# Patient Record
Sex: Male | Born: 1955 | Race: White | Hispanic: No | Smoking: Former smoker
Health system: Southern US, Community
[De-identification: ages and names within clinical notes are randomized; demographics above are authoritative.]

## PROBLEM LIST (undated history)

## (undated) DIAGNOSIS — D4989 Neoplasm of unspecified behavior of other specified sites: Secondary | ICD-10-CM

## (undated) DIAGNOSIS — F419 Anxiety disorder, unspecified: Secondary | ICD-10-CM

## (undated) DIAGNOSIS — K760 Fatty (change of) liver, not elsewhere classified: Secondary | ICD-10-CM

## (undated) DIAGNOSIS — G4730 Sleep apnea, unspecified: Secondary | ICD-10-CM

## (undated) DIAGNOSIS — F329 Major depressive disorder, single episode, unspecified: Secondary | ICD-10-CM

## (undated) DIAGNOSIS — M1990 Unspecified osteoarthritis, unspecified site: Secondary | ICD-10-CM

## (undated) DIAGNOSIS — E785 Hyperlipidemia, unspecified: Secondary | ICD-10-CM

## (undated) DIAGNOSIS — T7840XA Allergy, unspecified, initial encounter: Secondary | ICD-10-CM

## (undated) DIAGNOSIS — F32A Depression, unspecified: Secondary | ICD-10-CM

## (undated) DIAGNOSIS — C7951 Secondary malignant neoplasm of bone: Secondary | ICD-10-CM

## (undated) DIAGNOSIS — C61 Malignant neoplasm of prostate: Secondary | ICD-10-CM

## (undated) DIAGNOSIS — B009 Herpesviral infection, unspecified: Secondary | ICD-10-CM

## (undated) DIAGNOSIS — J189 Pneumonia, unspecified organism: Secondary | ICD-10-CM

## (undated) HISTORY — DX: Major depressive disorder, single episode, unspecified: F32.9

## (undated) HISTORY — DX: Neoplasm of unspecified behavior of other specified sites: D49.89

## (undated) HISTORY — DX: Herpesviral infection, unspecified: B00.9

## (undated) HISTORY — DX: Depression, unspecified: F32.A

## (undated) HISTORY — DX: Anxiety disorder, unspecified: F41.9

## (undated) HISTORY — DX: Fatty (change of) liver, not elsewhere classified: K76.0

## (undated) HISTORY — DX: Malignant neoplasm of prostate: C61

## (undated) HISTORY — DX: Allergy, unspecified, initial encounter: T78.40XA

## (undated) HISTORY — PX: INGUINAL HERNIA REPAIR: SUR1180

## (undated) HISTORY — DX: Unspecified osteoarthritis, unspecified site: M19.90

## (undated) HISTORY — DX: Pneumonia, unspecified organism: J18.9

## (undated) HISTORY — PX: KNEE ARTHROPLASTY: SHX992

## (undated) HISTORY — DX: Sleep apnea, unspecified: G47.30

## (undated) HISTORY — DX: Hyperlipidemia, unspecified: E78.5

## (undated) HISTORY — PX: SHOULDER ARTHROTOMY: SHX1050

---

## 1998-03-13 ENCOUNTER — Emergency Department (HOSPITAL_COMMUNITY): Admission: EM | Admit: 1998-03-13 | Discharge: 1998-03-13 | Payer: Self-pay | Admitting: Emergency Medicine

## 1998-03-16 ENCOUNTER — Emergency Department (HOSPITAL_COMMUNITY): Admission: EM | Admit: 1998-03-16 | Discharge: 1998-03-16 | Payer: Self-pay | Admitting: Emergency Medicine

## 2002-05-18 ENCOUNTER — Encounter: Payer: Self-pay | Admitting: Emergency Medicine

## 2005-04-03 ENCOUNTER — Ambulatory Visit: Payer: Self-pay | Admitting: Family Medicine

## 2005-04-10 ENCOUNTER — Ambulatory Visit: Payer: Self-pay | Admitting: Family Medicine

## 2005-04-17 ENCOUNTER — Ambulatory Visit: Payer: Self-pay | Admitting: Internal Medicine

## 2005-04-20 ENCOUNTER — Ambulatory Visit: Payer: Self-pay | Admitting: Internal Medicine

## 2005-04-20 ENCOUNTER — Encounter (INDEPENDENT_AMBULATORY_CARE_PROVIDER_SITE_OTHER): Payer: Self-pay | Admitting: Specialist

## 2005-06-11 ENCOUNTER — Ambulatory Visit: Payer: Self-pay | Admitting: Family Medicine

## 2006-03-28 ENCOUNTER — Ambulatory Visit: Payer: Self-pay | Admitting: Family Medicine

## 2006-04-18 ENCOUNTER — Ambulatory Visit: Payer: Self-pay | Admitting: Family Medicine

## 2006-04-30 ENCOUNTER — Ambulatory Visit: Payer: Self-pay | Admitting: Internal Medicine

## 2006-05-02 ENCOUNTER — Ambulatory Visit: Payer: Self-pay | Admitting: Family Medicine

## 2006-05-02 LAB — CONVERTED CEMR LAB
ALT: 28 units/L (ref 0–40)
AST: 21 units/L (ref 0–37)
Albumin: 4.1 g/dL (ref 3.5–5.2)
Alkaline Phosphatase: 47 units/L (ref 39–117)
BUN: 19 mg/dL (ref 6–23)
Basophils Absolute: 0 10*3/uL (ref 0.0–0.1)
Basophils Relative: 0.1 % (ref 0.0–1.0)
CO2: 31 meq/L (ref 19–32)
Calcium: 9.4 mg/dL (ref 8.4–10.5)
Chloride: 101 meq/L (ref 96–112)
Chol/HDL Ratio, serum: 2.9
Cholesterol: 203 mg/dL (ref 0–200)
Creatinine, Ser: 1.1 mg/dL (ref 0.4–1.5)
Eosinophil percent: 0.8 % (ref 0.0–5.0)
GFR calc non Af Amer: 76 mL/min
Glomerular Filtration Rate, Af Am: 92 mL/min/{1.73_m2}
Glucose, Bld: 100 mg/dL — ABNORMAL HIGH (ref 70–99)
HCT: 44.2 % (ref 39.0–52.0)
HDL: 68.9 mg/dL (ref >39.0)
Hemoglobin: 14.7 g/dL (ref 13.0–17.0)
IgM, Serum: 71 mg/dL (ref 60–263)
LDL DIRECT: 110.8 mg/dL
Lymphocytes Relative: 14.6 % (ref 12.0–46.0)
MCHC: 33.2 g/dL (ref 30.0–36.0)
MCV: 93.5 fL (ref 78.0–100.0)
Monocytes Absolute: 0.4 10*3/uL (ref 0.2–0.7)
Monocytes Relative: 4.9 % (ref 3.0–11.0)
Neutro Abs: 6.8 10*3/uL (ref 1.4–7.7)
Neutrophils Relative %: 79.6 % — ABNORMAL HIGH (ref 43.0–77.0)
PSA: 1.42 ng/mL (ref 0.10–4.00)
Platelets: 398 10*3/uL (ref 150–400)
Potassium: 4.3 meq/L (ref 3.5–5.1)
RBC: 4.73 M/uL (ref 4.22–5.81)
RDW: 12.6 % (ref 11.5–14.6)
Sodium: 141 meq/L (ref 135–145)
TSH: 1.04 microintl units/mL (ref 0.35–5.50)
Total Bilirubin: 0.7 mg/dL (ref 0.3–1.2)
Total Protein: 6.7 g/dL (ref 6.0–8.3)
Triglyceride fasting, serum: 58 mg/dL (ref 0–149)
VLDL: 12 mg/dL (ref 0–40)
WBC: 8.5 10*3/uL (ref 4.5–10.5)

## 2006-05-09 ENCOUNTER — Ambulatory Visit: Payer: Self-pay | Admitting: Family Medicine

## 2006-05-29 ENCOUNTER — Encounter: Payer: Self-pay | Admitting: Internal Medicine

## 2006-05-29 ENCOUNTER — Ambulatory Visit: Payer: Self-pay | Admitting: Internal Medicine

## 2006-07-04 ENCOUNTER — Ambulatory Visit: Payer: Self-pay | Admitting: Internal Medicine

## 2006-07-31 ENCOUNTER — Ambulatory Visit: Payer: Self-pay | Admitting: Pulmonary Disease

## 2006-08-07 ENCOUNTER — Ambulatory Visit: Payer: Self-pay | Admitting: Internal Medicine

## 2006-08-07 LAB — CONVERTED CEMR LAB
ALT: 36 units/L (ref 0–40)
AST: 22 units/L (ref 0–37)
Albumin: 3.2 g/dL — ABNORMAL LOW (ref 3.5–5.2)
Alkaline Phosphatase: 57 units/L (ref 39–117)
BUN: 21 mg/dL (ref 6–23)
Basophils Absolute: 0 10*3/uL (ref 0.0–0.1)
Basophils Relative: 0.2 % (ref 0.0–1.0)
Bilirubin, Direct: 0.1 mg/dL (ref 0.0–0.3)
CO2: 28 meq/L (ref 19–32)
Calcium: 9 mg/dL (ref 8.4–10.5)
Chloride: 104 meq/L (ref 96–112)
Creatinine, Ser: 1.1 mg/dL (ref 0.4–1.5)
Eosinophils Absolute: 0.1 10*3/uL (ref 0.0–0.6)
Eosinophils Relative: 0.7 % (ref 0.0–5.0)
GFR calc Af Amer: 91 mL/min
GFR calc non Af Amer: 75 mL/min
Glucose, Bld: 109 mg/dL — ABNORMAL HIGH (ref 70–99)
HCT: 41.6 % (ref 39.0–52.0)
Hemoglobin: 14.1 g/dL (ref 13.0–17.0)
Lymphocytes Relative: 6.3 % — ABNORMAL LOW (ref 12.0–46.0)
MCHC: 33.9 g/dL (ref 30.0–36.0)
MCV: 93.9 fL (ref 78.0–100.0)
Monocytes Absolute: 0.6 10*3/uL (ref 0.2–0.7)
Monocytes Relative: 6.2 % (ref 3.0–11.0)
Neutro Abs: 9 10*3/uL — ABNORMAL HIGH (ref 1.4–7.7)
Neutrophils Relative %: 86.6 % — ABNORMAL HIGH (ref 43.0–77.0)
Platelets: 403 10*3/uL — ABNORMAL HIGH (ref 150–400)
Potassium: 4.3 meq/L (ref 3.5–5.1)
RBC: 4.43 M/uL (ref 4.22–5.81)
RDW: 14.2 % (ref 11.5–14.6)
Sed Rate: 45 mm/hr — ABNORMAL HIGH (ref 0–20)
Sodium: 139 meq/L (ref 135–145)
TSH: 1.01 microintl units/mL (ref 0.35–5.50)
Total Bilirubin: 0.4 mg/dL (ref 0.3–1.2)
Total Protein: 6.8 g/dL (ref 6.0–8.3)
WBC: 10.4 10*3/uL (ref 4.5–10.5)

## 2006-08-21 ENCOUNTER — Ambulatory Visit: Payer: Self-pay | Admitting: Internal Medicine

## 2006-08-26 ENCOUNTER — Ambulatory Visit (HOSPITAL_BASED_OUTPATIENT_CLINIC_OR_DEPARTMENT_OTHER): Admission: RE | Admit: 2006-08-26 | Discharge: 2006-08-26 | Payer: Self-pay | Admitting: Pulmonary Disease

## 2006-08-26 ENCOUNTER — Encounter: Payer: Self-pay | Admitting: Pulmonary Disease

## 2006-08-30 ENCOUNTER — Ambulatory Visit: Payer: Self-pay | Admitting: Internal Medicine

## 2006-08-30 LAB — CONVERTED CEMR LAB
Glucose, Bld: 100 mg/dL — ABNORMAL HIGH (ref 70–99)
Hgb A1c MFr Bld: 6.3 % — ABNORMAL HIGH (ref 4.6–6.0)

## 2006-09-02 ENCOUNTER — Ambulatory Visit: Payer: Self-pay | Admitting: Family Medicine

## 2006-09-07 ENCOUNTER — Ambulatory Visit: Payer: Self-pay | Admitting: Pulmonary Disease

## 2006-09-20 ENCOUNTER — Ambulatory Visit: Payer: Self-pay | Admitting: Pulmonary Disease

## 2006-10-14 ENCOUNTER — Ambulatory Visit: Payer: Self-pay | Admitting: Internal Medicine

## 2006-10-30 ENCOUNTER — Ambulatory Visit: Payer: Self-pay | Admitting: Internal Medicine

## 2006-10-30 LAB — CONVERTED CEMR LAB
Basophils Absolute: 0.1 10*3/uL (ref 0.0–0.1)
Basophils Relative: 0.9 % (ref 0.0–1.0)
Eosinophils Absolute: 0.2 10*3/uL (ref 0.0–0.6)
Eosinophils Relative: 3.1 % (ref 0.0–5.0)
HCT: 40.6 % (ref 39.0–52.0)
Hemoglobin: 13.9 g/dL (ref 13.0–17.0)
Lymphocytes Relative: 23.2 % (ref 12.0–46.0)
MCHC: 34.3 g/dL (ref 30.0–36.0)
MCV: 93.3 fL (ref 78.0–100.0)
Monocytes Absolute: 0.9 10*3/uL — ABNORMAL HIGH (ref 0.2–0.7)
Monocytes Relative: 12.3 % — ABNORMAL HIGH (ref 3.0–11.0)
Neutro Abs: 4.7 10*3/uL (ref 1.4–7.7)
Neutrophils Relative %: 60.5 % (ref 43.0–77.0)
Platelets: 356 10*3/uL (ref 150–400)
RBC: 4.35 M/uL (ref 4.22–5.81)
RDW: 11.9 % (ref 11.5–14.6)
Sed Rate: 12 mm/hr (ref 0–20)
WBC: 7.7 10*3/uL (ref 4.5–10.5)

## 2006-11-05 ENCOUNTER — Ambulatory Visit: Payer: Self-pay | Admitting: Pulmonary Disease

## 2007-01-08 ENCOUNTER — Ambulatory Visit: Payer: Self-pay | Admitting: Internal Medicine

## 2007-04-10 ENCOUNTER — Ambulatory Visit: Payer: Self-pay | Admitting: Internal Medicine

## 2007-07-01 ENCOUNTER — Ambulatory Visit: Payer: Self-pay | Admitting: Family Medicine

## 2007-07-01 LAB — CONVERTED CEMR LAB
ALT: 24 units/L (ref 0–53)
AST: 20 units/L (ref 0–37)
Albumin: 4.2 g/dL (ref 3.5–5.2)
Alkaline Phosphatase: 64 units/L (ref 39–117)
BUN: 21 mg/dL (ref 6–23)
Basophils Absolute: 0 10*3/uL (ref 0.0–0.1)
Basophils Relative: 0.3 % (ref 0.0–1.0)
Bilirubin Urine: NEGATIVE
Bilirubin, Direct: 0.2 mg/dL (ref 0.0–0.3)
Blood in Urine, dipstick: NEGATIVE
CO2: 29 meq/L (ref 19–32)
Calcium: 9.5 mg/dL (ref 8.4–10.5)
Chloride: 107 meq/L (ref 96–112)
Cholesterol: 162 mg/dL (ref 0–200)
Creatinine, Ser: 1.1 mg/dL (ref 0.4–1.5)
Eosinophils Absolute: 0.2 10*3/uL (ref 0.0–0.6)
Eosinophils Relative: 4.3 % (ref 0.0–5.0)
GFR calc Af Amer: 91 mL/min
GFR calc non Af Amer: 75 mL/min
Glucose, Bld: 108 mg/dL — ABNORMAL HIGH (ref 70–99)
Glucose, Urine, Semiquant: NEGATIVE
HCT: 44.2 % (ref 39.0–52.0)
HDL: 50.2 mg/dL (ref >39.0)
Hemoglobin: 15.2 g/dL (ref 13.0–17.0)
Ketones, urine, test strip: NEGATIVE
LDL Cholesterol: 104 mg/dL — ABNORMAL HIGH (ref 0–99)
Lymphocytes Relative: 26.9 % (ref 12.0–46.0)
MCHC: 34.3 g/dL (ref 30.0–36.0)
MCV: 92.7 fL (ref 78.0–100.0)
Monocytes Absolute: 0.5 10*3/uL (ref 0.2–0.7)
Monocytes Relative: 9.1 % (ref 3.0–11.0)
Neutro Abs: 3.1 10*3/uL (ref 1.4–7.7)
Neutrophils Relative %: 59.4 % (ref 43.0–77.0)
Nitrite: NEGATIVE
PSA: 0.71 ng/mL (ref 0.10–4.00)
Platelets: 332 10*3/uL (ref 150–400)
Potassium: 4.9 meq/L (ref 3.5–5.1)
Protein, U semiquant: NEGATIVE
RBC: 4.77 M/uL (ref 4.22–5.81)
RDW: 12.3 % (ref 11.5–14.6)
Sodium: 143 meq/L (ref 135–145)
Specific Gravity, Urine: 1.025
TSH: 1.34 microintl units/mL (ref 0.35–5.50)
Total Bilirubin: 1 mg/dL (ref 0.3–1.2)
Total CHOL/HDL Ratio: 3.2
Total Protein: 6.7 g/dL (ref 6.0–8.3)
Triglycerides: 39 mg/dL (ref 0–149)
Urobilinogen, UA: 0.2
VLDL: 8 mg/dL (ref 0–40)
WBC Urine, dipstick: NEGATIVE
WBC: 5.2 10*3/uL (ref 4.5–10.5)
pH: 6

## 2007-07-08 ENCOUNTER — Ambulatory Visit: Payer: Self-pay | Admitting: Family Medicine

## 2007-07-08 DIAGNOSIS — E782 Mixed hyperlipidemia: Secondary | ICD-10-CM | POA: Insufficient documentation

## 2007-08-27 ENCOUNTER — Telehealth: Payer: Self-pay | Admitting: Pulmonary Disease

## 2007-08-29 DIAGNOSIS — G4733 Obstructive sleep apnea (adult) (pediatric): Secondary | ICD-10-CM | POA: Insufficient documentation

## 2007-09-05 ENCOUNTER — Telehealth: Payer: Self-pay | Admitting: Pulmonary Disease

## 2007-09-16 ENCOUNTER — Ambulatory Visit: Payer: Self-pay | Admitting: Pulmonary Disease

## 2008-06-18 ENCOUNTER — Ambulatory Visit: Payer: Self-pay | Admitting: Internal Medicine

## 2008-06-18 DIAGNOSIS — J069 Acute upper respiratory infection, unspecified: Secondary | ICD-10-CM | POA: Insufficient documentation

## 2008-08-19 ENCOUNTER — Ambulatory Visit: Payer: Self-pay | Admitting: Family Medicine

## 2008-08-19 LAB — CONVERTED CEMR LAB
ALT: 35 units/L (ref 0–53)
AST: 23 units/L (ref 0–37)
Albumin: 4.6 g/dL (ref 3.5–5.2)
Alkaline Phosphatase: 47 units/L (ref 39–117)
BUN: 17 mg/dL (ref 6–23)
Basophils Absolute: 0 10*3/uL (ref 0.0–0.1)
Basophils Relative: 0.1 % (ref 0.0–3.0)
Bilirubin Urine: NEGATIVE
Bilirubin, Direct: 0.1 mg/dL (ref 0.0–0.3)
CO2: 29 meq/L (ref 19–32)
Calcium: 9.4 mg/dL (ref 8.4–10.5)
Chloride: 103 meq/L (ref 96–112)
Cholesterol: 151 mg/dL (ref 0–200)
Creatinine, Ser: 1 mg/dL (ref 0.4–1.5)
Eosinophils Absolute: 0.1 10*3/uL (ref 0.0–0.7)
Eosinophils Relative: 2.5 % (ref 0.0–5.0)
GFR calc Af Amer: 101 mL/min
GFR calc non Af Amer: 83 mL/min
Glucose, Bld: 105 mg/dL — ABNORMAL HIGH (ref 70–99)
HCT: 45.3 % (ref 39.0–52.0)
HDL: 52.9 mg/dL (ref >39.0)
Hemoglobin, Urine: NEGATIVE
Hemoglobin: 15.7 g/dL (ref 13.0–17.0)
Ketones, ur: NEGATIVE mg/dL
LDL Cholesterol: 86 mg/dL (ref 0–99)
Leukocytes, UA: NEGATIVE
Lymphocytes Relative: 30.4 % (ref 12.0–46.0)
MCHC: 34.6 g/dL (ref 30.0–36.0)
MCV: 94.9 fL (ref 78.0–100.0)
Monocytes Absolute: 0.5 10*3/uL (ref 0.1–1.0)
Monocytes Relative: 8.2 % (ref 3.0–12.0)
Neutro Abs: 3.4 10*3/uL (ref 1.4–7.7)
Neutrophils Relative %: 58.8 % (ref 43.0–77.0)
Nitrite: NEGATIVE
PSA: 0.86 ng/mL (ref 0.10–4.00)
Platelets: 247 10*3/uL (ref 150–400)
Potassium: 4.8 meq/L (ref 3.5–5.1)
RBC: 4.77 M/uL (ref 4.22–5.81)
RDW: 12.3 % (ref 11.5–14.6)
Sodium: 140 meq/L (ref 135–145)
Specific Gravity, Urine: 1.01 (ref 1.000–1.03)
TSH: 1.35 microintl units/mL (ref 0.35–5.50)
Total Bilirubin: 1.2 mg/dL (ref 0.3–1.2)
Total CHOL/HDL Ratio: 2.9
Total Protein, Urine: NEGATIVE mg/dL
Total Protein: 7.3 g/dL (ref 6.0–8.3)
Triglycerides: 62 mg/dL (ref 0–149)
Urine Glucose: NEGATIVE mg/dL
Urobilinogen, UA: 0.2 (ref 0.0–1.0)
VLDL: 12 mg/dL (ref 0–40)
WBC: 5.8 10*3/uL (ref 4.5–10.5)
pH: 6 (ref 5.0–8.0)

## 2008-08-23 ENCOUNTER — Ambulatory Visit: Payer: Self-pay | Admitting: Family Medicine

## 2008-09-02 ENCOUNTER — Encounter: Payer: Self-pay | Admitting: Family Medicine

## 2008-09-02 ENCOUNTER — Ambulatory Visit: Payer: Self-pay | Admitting: Family Medicine

## 2008-09-06 DIAGNOSIS — D2330 Other benign neoplasm of skin of unspecified part of face: Secondary | ICD-10-CM | POA: Insufficient documentation

## 2009-01-03 ENCOUNTER — Telehealth: Payer: Self-pay | Admitting: Internal Medicine

## 2009-02-25 ENCOUNTER — Ambulatory Visit: Payer: Self-pay | Admitting: Internal Medicine

## 2009-02-28 LAB — CONVERTED CEMR LAB
Basophils Absolute: 0.1 10*3/uL (ref 0.0–0.1)
Basophils Relative: 1.4 % (ref 0.0–3.0)
Eosinophils Absolute: 0.2 10*3/uL (ref 0.0–0.7)
Eosinophils Relative: 3.1 % (ref 0.0–5.0)
HCT: 44.2 % (ref 39.0–52.0)
Hemoglobin: 15 g/dL (ref 13.0–17.0)
Lymphocytes Relative: 31.2 % (ref 12.0–46.0)
Lymphs Abs: 2.3 10*3/uL (ref 0.7–4.0)
MCHC: 34 g/dL (ref 30.0–36.0)
MCV: 95.3 fL (ref 78.0–100.0)
Monocytes Absolute: 0.8 10*3/uL (ref 0.1–1.0)
Monocytes Relative: 10.4 % (ref 3.0–12.0)
Neutro Abs: 4.1 10*3/uL (ref 1.4–7.7)
Neutrophils Relative %: 53.9 % (ref 43.0–77.0)
Platelets: 266 10*3/uL (ref 150.0–400.0)
RBC: 4.64 M/uL (ref 4.22–5.81)
RDW: 11.8 % (ref 11.5–14.6)
Sed Rate: 5 mm/hr (ref 0–22)
WBC: 7.5 10*3/uL (ref 4.5–10.5)

## 2009-04-05 ENCOUNTER — Ambulatory Visit: Payer: Self-pay | Admitting: Family Medicine

## 2009-04-13 ENCOUNTER — Encounter (INDEPENDENT_AMBULATORY_CARE_PROVIDER_SITE_OTHER): Payer: Self-pay | Admitting: *Deleted

## 2009-04-26 ENCOUNTER — Telehealth: Payer: Self-pay | Admitting: Family Medicine

## 2009-08-19 ENCOUNTER — Ambulatory Visit: Payer: Self-pay | Admitting: Family Medicine

## 2009-08-19 LAB — CONVERTED CEMR LAB
ALT: 25 units/L (ref 0–53)
AST: 23 units/L (ref 0–37)
Albumin: 4.3 g/dL (ref 3.5–5.2)
Alkaline Phosphatase: 39 units/L (ref 39–117)
BUN: 23 mg/dL (ref 6–23)
Basophils Absolute: 0.1 10*3/uL (ref 0.0–0.1)
Basophils Relative: 1.6 % (ref 0.0–3.0)
Bilirubin Urine: NEGATIVE
Bilirubin, Direct: 0.1 mg/dL (ref 0.0–0.3)
Blood in Urine, dipstick: NEGATIVE
CO2: 30 meq/L (ref 19–32)
Calcium: 8.9 mg/dL (ref 8.4–10.5)
Chloride: 106 meq/L (ref 96–112)
Cholesterol: 148 mg/dL (ref 0–200)
Creatinine, Ser: 1 mg/dL (ref 0.4–1.5)
Eosinophils Absolute: 0.2 10*3/uL (ref 0.0–0.7)
Eosinophils Relative: 3.6 % (ref 0.0–5.0)
GFR calc non Af Amer: 83 mL/min (ref >60)
Glucose, Bld: 98 mg/dL (ref 70–99)
Glucose, Urine, Semiquant: NEGATIVE
HCT: 42.7 % (ref 39.0–52.0)
HDL: 49.3 mg/dL (ref >39.00)
Hemoglobin: 14.1 g/dL (ref 13.0–17.0)
Ketones, urine, test strip: NEGATIVE
LDL Cholesterol: 87 mg/dL (ref 0–99)
Lymphocytes Relative: 32 % (ref 12.0–46.0)
Lymphs Abs: 1.5 10*3/uL (ref 0.7–4.0)
MCHC: 32.9 g/dL (ref 30.0–36.0)
MCV: 97.2 fL (ref 78.0–100.0)
Monocytes Absolute: 0.5 10*3/uL (ref 0.1–1.0)
Monocytes Relative: 10.4 % (ref 3.0–12.0)
Neutro Abs: 2.4 10*3/uL (ref 1.4–7.7)
Neutrophils Relative %: 52.4 % (ref 43.0–77.0)
Nitrite: NEGATIVE
Platelets: 239 10*3/uL (ref 150.0–400.0)
Potassium: 4.4 meq/L (ref 3.5–5.1)
Protein, U semiquant: NEGATIVE
RBC: 4.39 M/uL (ref 4.22–5.81)
RDW: 12.1 % (ref 11.5–14.6)
Sodium: 141 meq/L (ref 135–145)
Specific Gravity, Urine: 1.025
TSH: 1.35 microintl units/mL (ref 0.35–5.50)
Total Bilirubin: 0.5 mg/dL (ref 0.3–1.2)
Total CHOL/HDL Ratio: 3
Total Protein: 6.7 g/dL (ref 6.0–8.3)
Triglycerides: 58 mg/dL (ref 0.0–149.0)
Urobilinogen, UA: 0.2
VLDL: 11.6 mg/dL (ref 0.0–40.0)
WBC Urine, dipstick: NEGATIVE
WBC: 4.7 10*3/uL (ref 4.5–10.5)
pH: 7

## 2009-08-25 ENCOUNTER — Ambulatory Visit: Payer: Self-pay | Admitting: Family Medicine

## 2009-10-20 ENCOUNTER — Ambulatory Visit: Payer: Self-pay | Admitting: Family Medicine

## 2010-03-30 ENCOUNTER — Encounter (INDEPENDENT_AMBULATORY_CARE_PROVIDER_SITE_OTHER): Payer: Self-pay | Admitting: *Deleted

## 2010-04-12 ENCOUNTER — Encounter (INDEPENDENT_AMBULATORY_CARE_PROVIDER_SITE_OTHER): Payer: Self-pay | Admitting: *Deleted

## 2010-04-12 ENCOUNTER — Telehealth: Payer: Self-pay | Admitting: Internal Medicine

## 2010-04-20 ENCOUNTER — Encounter (INDEPENDENT_AMBULATORY_CARE_PROVIDER_SITE_OTHER): Payer: Self-pay | Admitting: *Deleted

## 2010-04-21 ENCOUNTER — Ambulatory Visit: Payer: Self-pay | Admitting: Internal Medicine

## 2010-05-08 ENCOUNTER — Ambulatory Visit: Payer: Self-pay | Admitting: Internal Medicine

## 2010-05-10 ENCOUNTER — Encounter: Payer: Self-pay | Admitting: Internal Medicine

## 2010-08-08 NOTE — Letter (Signed)
Summary: [facility] Instructions  [name]  [address] [place], [place] [address]   Phone: [phone]  Fax: [phone]       [name]    October 12, 1955    MRN: [id number]        Procedure Day [name]:  [facility]  05/08/10     Arrival Time:  1:30PM     Procedure Time:  2:30PM     Location of Procedure:                    _X _  [facility] (4th Floor)                      PREPARATION FOR COLONOSCOPY WITH MOVIPREP   Starting 5 days prior to your procedure 05/03/10 do not eat nuts, seeds, popcorn, corn, beans, peas,  salads, or any raw vegetables.  Do not take any fiber supplements (e.g. Metamucil, Citrucel, and Benefiber).  THE DAY BEFORE YOUR PROCEDURE         DATE: 05/07/10  DAY: SUNDAY  1.  Drink clear liquids the entire day-NO SOLID FOOD  2.  Do not drink anything colored red or purple.  Avoid juices with pulp.  No orange juice.  3.  Drink at least 64 oz. (8 glasses) of fluid/clear liquids during the day to prevent dehydration and help the prep work efficiently.  CLEAR LIQUIDS INCLUDE: Water Jello Ice Popsicles Tea (sugar ok, no milk/cream) Powdered fruit flavored drinks Coffee (sugar ok, no milk/cream) Gatorade Juice: apple, white grape, white cranberry  Lemonade Clear bullion, consomm, broth Carbonated beverages (any kind) Strained chicken noodle soup Hard Candy                             4.  In the morning, mix first dose of MoviPrep solution:    Empty 1 Pouch A and 1 Pouch B into the disposable container    Add lukewarm drinking water to the top line of the container. Mix to dissolve    Refrigerate (mixed solution should be used within 24 hrs)  5.  Begin drinking the prep at 5:00 p.m. The MoviPrep container is divided by 4 marks.   Every 15 minutes drink the solution down to the next mark (approximately 8 oz) until the full liter is complete.   6.  Follow completed prep with 16 oz of clear liquid of your choice (Nothing  red or purple).  Continue to drink clear liquids until bedtime.  7.  Before going to bed, mix second dose of MoviPrep solution:    Empty 1 Pouch A and 1 Pouch B into the disposable container    Add lukewarm drinking water to the top line of the container. Mix to dissolve    Refrigerate  THE DAY OF YOUR PROCEDURE      DATE: 05/08/10  DAY: MONDAY  Beginning at 9:30AM (5 hours before procedure):         1. Every 15 minutes, drink the solution down to the next mark (approx 8 oz) until the full liter is complete.  2. Follow completed prep with 16 oz. of clear liquid of your choice.    3. You may drink clear liquids until 12:30PM (2 HOURS BEFORE PROCEDURE).   MEDICATION INSTRUCTIONS  Unless otherwise instructed, you should take regular prescription medications with a small sip of water   as early as possible the morning of your  procedure.       OTHER INSTRUCTIONS  You will need a responsible adult at least 55 years of age to accompany you and drive you home.   This person must remain in the waiting room during your procedure.  Wear loose fitting clothing that is easily removed.  Leave jewelry and other valuables at home.  However, you may wish to bring a book to read or  an iPod/MP3 player to listen to music as you wait for your procedure to start.  Remove all body piercing jewelry and leave at home.  Total time from sign-in until discharge is approximately 2-3 hours.  You should go home directly after your procedure and rest.  You can resume normal activities the  day after your procedure.  The day of your procedure you should not:   Drive   Make legal decisions   Operate machinery   Drink alcohol   Return to work  You will receive specific instructions about eating, activities and medications before you leave.   The above instructions have been reviewed and explained to me by   [name] RN  April 21, 2010 8:58 AM     I fully understand and can  verbalize these instructions _____________________________ Date _________

## 2010-08-08 NOTE — Assessment & Plan Note (Signed)
Summary: COUGH, CONGESTION // RS   Vital Signs:  Patient profile:   55 year old male Weight:      186 pounds Temp:     98.0 degrees F oral BP sitting:   118 / 80  (left arm) Cuff size:   regular  Vitals Entered By: Kern Reap CMA Duncan Dull) (October 20, 2009 11:03 AM) CC: sinus, cough, drainage x 2 [name] Is Patient Diabetic? No Pain Assessment Patient in pain? no        Primary Care Provider:  Governor Specking, MD  CC:  sinus, cough, and drainage x 2 [name].  History of Present Illness: [name] is a 55 year old, married male, nonsmoker, who comes in today because of severe allergic rhinitis.  His allergy problems, which usually flareup in the springtime for about two [name] ago.  His nose head congestion, postnasal drip, some popping in his ears cough, but no wheezing.  Allergies: 1)  Sulfamethoxazole (Sulfamethoxazole)  Past History:  Past medical, surgical, family and social histories (including risk factors) reviewed for relevance to current acute and chronic problems.  Past Medical History: Reviewed history from 02/22/2009 and no changes required. 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 R Hernia(340) Colonoscopy-04/20/2005 Open amputation of right fourth toe Shoulder Surgery  Family History: Reviewed history from 02/25/2009 and no changes required. Family History Diabetes 1st degree relative Family History Lung cancer Family History Thyroid disease Fam hx MI: Father Family History Other cancer-breast No FH of Colon Cancer:  Social History: Reviewed  history from 07/08/2007 and no changes required. 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 No apparent alopecia or balding. Eyes:  No corneal or conjunctival inflammation noted. EOMI. Perrla. Funduscopic exam benign, without hemorrhages, exudates or papilledema. Vision grossly normal. Ears:  External ear exam shows no significant lesions or deformities.  Otoscopic examination reveals clear canals, tympanic membranes are intact bilaterally without bulging, retraction, inflammation or discharge. Hearing is grossly normal bilaterally. Nose:  slight deviation of the septum to the right 4+ nasal edema Mouth:  Oral mucosa and oropharynx without lesions or exudates.  Teeth in good repair. Neck:  No deformities, masses, or tenderness noted. Lungs:  Normal respiratory effort, chest expands symmetrically. Lungs are clear to auscultation, no crackles or wheezes.   Impression & Recommendations:  Problem # 1:  ALLERGIC RHINITIS (ICD-477.9) Assessment Deteriorated  Complete Medication List: 1)  Simvastatin 20 Mg Tabs (Simvastatin) .... Take 1 tablet by mouth once a day 2)  Balsalazide Disodium 750 Mg Caps (Balsalazide disodium) .... Take 2 capsules by mouth two times a day 3)  Eql Fish Oil 1000 Mg Caps (Omega-3 fatty acids) .... Take 2 tabs by mouth two times a day 4)  Glucosamine-chondroitin 1500-1200 Mg/76ml Liqd (Glucosamine-chondroitin) .... Take 1 tablet by mouth two times a day 5)  Multivitamins Tabs (Multiple vitamin) .... Take 1 tablet by mouth once a day 6)  Acyclovir 400 Mg Tabs (Acyclovir) .... Take 1 tablet by mouth two times a day 7)  Prednisone 20  Mg Tabs (Prednisone) .... Uad  Patient  Instructions: 1)  take plain Claritin or Allegra in the morning or plain Zyrtec at bedtime. 2)  At bedtime use one shot of afrin nasal spray, followed by the warm saline irrigation with a netti pot .  Remember after 5 nights stop the Afrin. 3)  If you do all this and things will not  quiet down.  Then, take a short course of prednisone, one tablet for 3 days, a half for 3 days, then half a tablet Monday, Wednesday, Friday, for two [name] Prescriptions: PREDNISONE 20 MG TABS (PREDNISONE) UAD  #30 x 1   Entered and Authorized by:   Roderick Pee MD   Signed by:   Roderick Pee MD on 10/20/2009   Method used:   Print then Give to Patient   RxID:   [id number]

## 2010-08-08 NOTE — Miscellaneous (Signed)
Summary: [facility] Previsit/prep  Clinical Lists Changes  Medications: Added new medication of MOVIPREP 100 GM  SOLR (PEG-KCL-NACL-NASULF-NA ASC-C) As per prep instructions. - Signed Rx of MOVIPREP 100 GM  SOLR (PEG-KCL-NACL-NASULF-NA ASC-C) As per prep instructions.;  #1 x 0;  Signed;  Entered by: [name] RN;  Authorized by: Hart Carwin MD;  Method used: [facility]  [place] [address], [place], [place]  [address], Ph: [phone] or [phone], Fax: [phone] Observations: Added new observation of ALLERGY REV: Done (04/21/2010 8:31)    Prescriptions: MOVIPREP 100 GM  SOLR (PEG-KCL-NACL-NASULF-NA ASC-C) As per prep instructions.  #1 x 0   Entered by:   [name] RN   Authorized by:   Hart Carwin MD   Signed by:   [name] RN on 04/21/2010   Method used:   [facility]        [facility]  [place] [address] [facility]ail[facility]       [address]       [place], [place]  [address]       Ph: [phone] or [phone]       Fax: [phone]   RxID:   [id number]   Appended Document: [facility] Previsit/prep Pt states prep is not at pharmacy.  Moviprep resent.   Clinical Lists Changes  Prescriptions: MOVIPREP 100 GM  SOLR (PEG-KCL-NACL-NASULF-NA ASC-C) As per prep instructions.  #1 x 0   Entered by:   Francee Piccolo CMA (AAMA)   Authorized by:   Hart Carwin MD   Signed by:   Francee Piccolo CMA (AAMA) on 05/05/2010   Method used:   [facility]        [facility]  [place] [address]ail[facility]       [address]       [place], [place]  [address]       Ph: [phone] or [phone]       Fax: [phone]   RxID:   [id number]

## 2010-08-08 NOTE — Progress Notes (Signed)
Summary: refill complications   Phone Note Call from Patient Call back at Home Phone [phone]   Caller: Patient Call For: Dr. [name] Reason for Call: Talk to Nurse Summary of Call: unable to refill Balasazide... pharmacy did not give explaination per pt... [facility] [address] [facility] [place] Initial call taken by: Vallarie Mare,  April 12, 2010 9:12 AM  Follow-up for Phone Call        We have not gotten any recent refill requests so I am unsure what the problem was. I have advised patient that I will send refills to his pharmacy for pick up. Follow-up by: Lamona Curl CMA (AAMA),  April 12, 2010 1:11 PM    Prescriptions: BALSALAZIDE DISODIUM 750 MG  CAPS (BALSALAZIDE DISODIUM) Take 2 capsules by mouth two times a day  #120 x 0   Entered by:   Lamona Curl CMA (AAMA)   Authorized by:   Hart Carwin MD   Signed by:   Lamona Curl CMA (AAMA) on 04/12/2010   Method used:   [facility]        [facility]  [place] [address]ail[facility]       [address]       [place], [place]  [address]       Ph: [phone] or [phone]       Fax: [phone]   RxID:   [id number]

## 2010-08-08 NOTE — Assessment & Plan Note (Signed)
Summary: cpx//ccm   Vital Signs:  Patient profile:   55 year old male Height:      66.75 inches Weight:      186 pounds Temp:     98.7 degrees F oral BP sitting:   110 / 80  (left arm) Cuff size:   regular  Vitals Entered By: Kern Reap CMA Duncan Dull) (August 25, 2009 8:42 AM)  Reason for Visit cpx  Primary Care Provider:  Governor Specking, MD   History of Present Illness: [name] is a 55 year old, married male, nonsmoker, who comes in today for physical examination  He has underlying hyperlipidemia, for which he takes simvastatin 20 mg daily.  Lipids are goal with a total cholesterol of 148, LDL 87.  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  He is to take it p.r.n..  However, because of the recurrent tissue is now taking it daily.  He is also on balsalazide 750 mg two caps b.i.d. for ulcerative colitis.  Asymptomatic on medication.  He gets routine eye care and dental care.  Colonoscopy done in GI.  Tetanus 2000, seasonal flu 2010  Allergies: 1)  Sulfamethoxazole (Sulfamethoxazole)  Past History:  Past medical, surgical, family and social histories (including risk factors) reviewed, and no changes noted (except as noted below).  Past Medical History: Reviewed history from 02/22/2009 and no changes required. 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 R Hernia(340) Colonoscopy-04/20/2005 Open amputation of right fourth toe Shoulder Surgery  Family History: Reviewed history from 02/25/2009 and no changes required. Family History Diabetes 1st degree  relative Family History Lung cancer Family History Thyroid disease Fam hx MI: Father Family History Other cancer-breast No FH of Colon Cancer:  Social History: Reviewed history from 07/08/2007 and no changes required. 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 No apparent alopecia or balding. Eyes:  No corneal or conjunctival inflammation noted. EOMI. Perrla. Funduscopic exam benign, without hemorrhages, exudates or papilledema. Vision grossly normal. Ears:  External ear exam shows no significant lesions or deformities.  Otoscopic examination reveals clear canals, tympanic membranes are intact bilaterally without bulging, retraction, inflammation or discharge. Hearing is grossly normal bilaterally. Nose:  External nasal examination shows no deformity or inflammation. Nasal mucosa are pink and moist without lesions or exudates. Mouth:  Oral mucosa and oropharynx without lesions or exudates.  Teeth in good repair. Neck:  No deformities, masses, or tenderness noted. Chest Wall:  No deformities, masses, tenderness or gynecomastia noted. Breasts:  No masses or gynecomastia noted Lungs:  Normal respiratory effort, chest expands symmetrically. Lungs are clear to auscultation, no crackles or wheezes. Heart:  Normal rate and regular rhythm. S1 and S2 normal without gallop, murmur, click, rub or other extra sounds. Abdomen:  Bowel sounds positive,abdomen soft and non-tender without masses, organomegaly or hernias noted. Rectal:  No external abnormalities noted. Normal sphincter tone. No rectal masses or tenderness. Genitalia:  Testes bilaterally descended without nodularity, tenderness or masses. No scrotal masses or lesions. No penis lesions or  urethral discharge. Prostate:  Prostate gland firm and smooth, no  enlargement, nodularity, tenderness, mass, asymmetry or induration. Msk:  No deformity or scoliosis noted of thoracic or lumbar spine.   Pulses:  R and L carotid,radial,femoral,dorsalis pedis and posterior tibial pulses are full and equal bilaterally Extremities:  No clubbing, cyanosis, edema, or deformity noted with normal full range of motion of all joints.   Neurologic:  No cranial nerve deficits noted. Station and gait are normal. Plantar reflexes are down-going bilaterally. DTRs are symmetrical throughout. Sensory, motor and coordinative functions appear intact. Skin:  Intact without suspicious lesions or rashes Cervical Nodes:  No lymphadenopathy noted Axillary Nodes:  No palpable lymphadenopathy Inguinal Nodes:  No significant adenopathy Psych:  Cognition and judgment appear intact. Alert and cooperative with normal attention span and concentration. 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 Take 1 tablet by mouth once a day  Orders: Prescription Created Electronically [phone])  Problem # 2:  ROUTINE GENERAL MEDICAL EXAM@HEALTH  CARE FACL (ICD-V70.0) Assessment: Unchanged  Orders: Prescription Created Electronically [phone])  Complete Medication List: 1)  Simvastatin 20 Mg Tabs (Simvastatin) .... Take 1 tablet by mouth once a day 2)  Balsalazide Disodium 750 Mg Caps (Balsalazide disodium) .... Take 2 capsules by mouth two times a day 3)  Eql Fish Oil 1000 Mg Caps (Omega-3 fatty acids) .... Take 2 tabs by mouth two times a day 4)  Glucosamine-chondroitin 1500-1200 Mg/75ml Liqd (Glucosamine-chondroitin) .... Take 1 tablet by mouth two times a day 5)  Multivitamins Tabs (Multiple vitamin) .... Take 1  tablet by mouth once a day 6)  Valtrex 500 Mg Tabs (Valacyclovir hcl) .... Take 1 tablet by mouth two times a day 7)  Acyclovir 400 Mg Tabs (Acyclovir) .... Take 1 tablet by mouth two times a day  Other Orders: EKG w/ Interpretation (93000)  Patient Instructions: 1)  Please schedule a follow-up appointment in 1 year. 2)  If you could become pregnant, take a multivitamin with folic acid every day. 3)  Take an Aspirin every day. Prescriptions: ACYCLOVIR 400 MG TABS (ACYCLOVIR) Take 1 tablet by mouth two times a day  #200 x 3   Entered and Authorized by:   Roderick Pee MD   Signed by:   Roderick Pee MD on 08/25/2009   Method used:   Print then Give to Patient   RxID:   [id number] SIMVASTATIN 20 MG TABS (SIMVASTATIN) Take 1 tablet by mouth once a day  #100 x 3   Entered and Authorized by:   Roderick Pee MD   Signed by:   Roderick Pee MD on 08/25/2009   Method used:   [facility]        [facility]  [place] [address] [facility]       [address]       [place], [place]  [address]       Ph: [phone] or [phone]       Fax: [phone]   RxID:   [id number] VALTREX 500 MG TABS (VALACYCLOVIR HCL) Take 1 tablet by mouth two times a day  #200 x 3   Entered and Authorized by:   Roderick Pee MD   Signed by:   Roderick Pee MD on 08/25/2009   Method used:   [facility]        [facility]  [place] [address]  [facility] [address] (retail)       [address]       [place], [place]  [address]       Ph: [phone] or [phone]       Fax: [phone]   RxID:   [id number]

## 2010-08-08 NOTE — Letter (Signed)
Summary: Pre Visit Letter Revised  [facility]  [address] [place], [place] [address]   Phone: [phone]  Fax: [phone]        04/12/2010 MRN: [id number] [facility] [address] [place], [place]  [address]             Procedure Date:  05/08/2010   Welcome to the Gastroenterology Division at [facility].    You are scheduled to see a nurse for your pre-procedure visit on 04/21/2010 at 8:30AM on the 3rd floor at [facility], [address]  We ask that you try to arrive at our office 15 minutes prior to your appointment time to allow for check-in.  Please take a minute to review the attached form.  If you answer "Yes" to one or more of the questions on the first page, we ask that you call the person listed at your earliest opportunity.  If you answer "No" to all of the questions, please complete the rest of the form and bring it to your appointment.    Your nurse visit will consist of discussing your medical and surgical history, your immediate family medical history, and your medications.   If you are unable to list all of your medications on the form, please bring the medication bottles to your appointment and we will list them.  We will need to be aware of both prescribed and over the counter drugs.  We will need to know exact dosage information as well.    Please be prepared to read and sign documents such as consent forms, a financial agreement, and acknowledgement forms.  If necessary, and with your consent, a friend or relative is welcome to sit-in on the nurse visit with you.  Please bring your insurance card so that we may make a copy of it.  If your insurance requires a referral to see a specialist, please bring your referral form from your primary care physician.  No co-pay is required for this nurse visit.     If you cannot keep your appointment, please call [phone] to cancel or reschedule prior to your appointment date.   This allows [place] the opportunity to schedule an appointment for another patient in need of care.    Thank you for choosing [facility] for your medical needs.  We appreciate the opportunity to care for you.  Please visit [place] at our website  to learn more about our practice.  Sincerely, The Gastroenterology Division

## 2010-08-08 NOTE — Letter (Signed)
Summary: Patient Notice- Colon Biospy Results  [facility]  [address] [place], [place] [address]   Phone: [phone]  Fax: [phone]        May 10, 2010 MRN: [id number]    [facility] [address] [place], [place]  [address]    Dear [name],  I am pleased to inform you that the biopsies taken during your recent colonoscopy did not show any evidence of cancer upon pathologic examination.The biopsies show normal colon tissue, no evidence of colitis!  Additional information/recommendations:  __No further action is needed at this time.  Please follow-up with      your primary care physician for your other healthcare needs.  __Please call [phone] to schedule a return visit to review      your condition.  _x_Continue with the treatment plan as outlined on the day of your      exam.You may reduce the Colazal to 3 tabs/day.  _x_You should have a repeat colonoscopy examination for this problem           in 5_ years.  Please call us if you are having persistent problems or have questions about your condition that have not been fully answered at this time.  Sincerely,  Jeffery Carwin MD   This letter has been electronically signed by your physician.  Appended Document: Patient Notice- Colon Biospy Results letter mailed

## 2010-08-08 NOTE — Letter (Signed)
Summary: Colonoscopy Letter  [facility]  [address] [place], [place] [address]   Phone: [phone]  Fax: [phone]      March 30, 2010 MRN: [id number]   [facility] [address] [place], [place]  [address]   Dear [name],   According to your medical record, it is time for you to schedule a Colonoscopy. The American Cancer Society recommends this procedure as a method to detect early colon cancer. Patients with a family history of colon cancer, or a personal history of colon polyps or inflammatory bowel disease are at increased risk.  This letter has beeen generated based on the recommendations made at the time of your procedure. If you feel that in your particular situation this may no longer apply, please contact our office.  Please call our office at [phone] to schedule this appointment or to update your records at your earliest convenience.  Thank you for cooperating with [place] to provide you with the very best care possible.   Sincerely,  [name]. [name], M.D.  [facility] [phone]

## 2010-08-08 NOTE — Procedures (Signed)
Summary: Colonoscopy  Patient: [name] Note: All result statuses are Final unless otherwise noted.  Tests: (1) Colonoscopy (COL)   COL Colonoscopy           DONE (C)     [facility]     [address]     [place], [place]  [address]           COLONOSCOPY PROCEDURE REPORT           PATIENT:  [name], [name]  MR#:  [id number]     BIRTHDATE:  Oct 20, 1955, 53 yrs. old  GENDER:  male     ENDOSCOPIST:  [name]. [name], MD     REF. BY:  Tinnie Gens A. Tawanna Cooler, M.D.     PROCEDURE DATE:  05/08/2010     PROCEDURE:  Colonoscopy 21308     ASA CLASS:  Class II     INDICATIONS:  evaluation of ulcerative colitis last colon 2006 no     colitis, flex sigm 2007 active colitis 0-30 cm, initial     colitis Dx 2002, this is a screening colonoscopy in an     asymptomatic patient . elevated risk for colon cancer     MEDICATIONS:   Versed 10 mg, Fentanyl 100 mcg, Benadryl 25 mg           DESCRIPTION OF PROCEDURE:   After the risks benefits and     alternatives of the procedure were thoroughly explained, informed     consent was obtained.  Digital rectal exam was performed and     revealed no rectal masses.   The LB PCF-H180AL [id number] endoscope     was introduced through the anus and advanced to the cecum, which     was identified by both the appendix and ileocecal valve, without     limitations.  The quality of the prep was excellent, using     MiraLax.  The instrument was then slowly withdrawn as the colon     was fully examined.     <<PROCEDUREIMAGES>>           FINDINGS:  No polyps or cancers were seen. Random biopsies were     obtained and sent to pathology (see image1, image2, image3, and     image4).   Retroflexed views in the rectum revealed no     abnormalities.    The scope was then withdrawn from the patient     and the procedure completed.           COMPLICATIONS:  None     ENDOSCOPIC IMPRESSION:     1) No polyps or cancers     2) Normal colonoscopy     s/p random biopsies  RECOMMENDATIONS:     1) high fiber diet     2) Await biopsy results     decrease Colazal to 750 mg 1 po bid     REPEAT EXAM:  In 5 year(s) for.           ______________________________     [name]. [name], MD           CC:           n.     REVISED:  05/08/2010 04:35 PM     eSIGNED:   [name]. Ana Woodroof at 05/08/2010 04:35 PM           [place], [id number]  Note: An exclamation mark (!) indicates a result that was not dispersed into the flowsheet. Document  Creation Date: 05/08/2010 4:36 PM _______________________________________________________________________  (1) Order result status: Final Collection or observation date-time: 05/08/2010 15:13 Requested date-time:  Receipt date-time:  Reported date-time:  Referring Physician:   Ordering Physician: Lina Sar [phone]) Specimen Source:  Source: Launa Grill Order Number: [id number] Lab site:   Appended Document: Colonoscopy     Procedures Next Due Date:    Colonoscopy: 05/2015

## 2010-08-29 ENCOUNTER — Other Ambulatory Visit: Payer: BC Managed Care – PPO | Admitting: Family Medicine

## 2010-08-29 ENCOUNTER — Other Ambulatory Visit: Payer: Self-pay | Admitting: Family Medicine

## 2010-08-29 DIAGNOSIS — Z0000 Encounter for general adult medical examination without abnormal findings: Secondary | ICD-10-CM

## 2010-08-29 LAB — POCT URINALYSIS DIPSTICK
Ketones, UA: NEGATIVE
Leukocytes, UA: NEGATIVE
Nitrite, UA: NEGATIVE
Protein, UA: NEGATIVE
pH, UA: 6

## 2010-08-29 LAB — BASIC METABOLIC PANEL
CO2: 29 mEq/L (ref 19–32)
Calcium: 9.2 mg/dL (ref 8.4–10.5)
Creatinine, Ser: 1.1 mg/dL (ref 0.4–1.5)
Glucose, Bld: 94 mg/dL (ref 70–99)
Sodium: 140 mEq/L (ref 135–145)

## 2010-08-29 LAB — CBC WITH DIFFERENTIAL/PLATELET
Eosinophils Relative: 3.7 % (ref 0.0–5.0)
Monocytes Relative: 10.3 % (ref 3.0–12.0)
Neutrophils Relative %: 58.5 % (ref 43.0–77.0)
Platelets: 272 10*3/uL (ref 150.0–400.0)
RBC: 4.47 Mil/uL (ref 4.22–5.81)
WBC: 5.8 10*3/uL (ref 4.5–10.5)

## 2010-08-29 LAB — TSH: TSH: 1.62 u[IU]/mL (ref 0.35–5.50)

## 2010-08-29 LAB — HEPATIC FUNCTION PANEL
ALT: 24 U/L (ref 0–53)
AST: 22 U/L (ref 0–37)
Alkaline Phosphatase: 43 U/L (ref 39–117)
Bilirubin, Direct: 0.1 mg/dL (ref 0.0–0.3)
Total Bilirubin: 1 mg/dL (ref 0.3–1.2)

## 2010-08-29 LAB — LIPID PANEL
LDL Cholesterol: 108 mg/dL — ABNORMAL HIGH (ref 0–99)
Total CHOL/HDL Ratio: 3

## 2010-08-29 LAB — PSA: PSA: 1.43 ng/mL (ref 0.10–4.00)

## 2010-09-05 ENCOUNTER — Encounter: Payer: Self-pay | Admitting: Family Medicine

## 2010-09-05 ENCOUNTER — Ambulatory Visit (INDEPENDENT_AMBULATORY_CARE_PROVIDER_SITE_OTHER): Payer: BC Managed Care – PPO | Admitting: Family Medicine

## 2010-09-05 DIAGNOSIS — R413 Other amnesia: Secondary | ICD-10-CM

## 2010-09-05 DIAGNOSIS — B009 Herpesviral infection, unspecified: Secondary | ICD-10-CM | POA: Insufficient documentation

## 2010-09-05 DIAGNOSIS — E782 Mixed hyperlipidemia: Secondary | ICD-10-CM

## 2010-09-05 DIAGNOSIS — E785 Hyperlipidemia, unspecified: Secondary | ICD-10-CM

## 2010-09-05 MED ORDER — VALACYCLOVIR HCL 500 MG PO TABS: 500.0000 mg | ORAL_TABLET | Freq: Two times a day (BID) | ORAL | Status: DC

## 2010-09-05 MED ORDER — SIMVASTATIN 20 MG PO TABS: 20.0000 mg | ORAL_TABLET | Freq: Every day | ORAL | Status: DC

## 2010-09-05 NOTE — Progress Notes (Signed)
  Subjective:    Patient ID: [name], male    DOB: 1955/09/16, 55 y.o.   MRN: [id number]  [name] is a 55 year old, married male, nonsmoker, who comes in today for general physical examination because her history of hyperlipidemia, recurrent herpes simplex, ulcerative colitis,  He takes 20 mg of simvastatin nightly for hyperlipidemia.  Lipids are at goal with an LDL of 108.  He has a history of ulcerative colitis followed by Dr. Julio Alm.  Last colonoscopy in the fall of 2011 normal.  He also has reached current herpes simplex for which he takes acyclovir 500 mg b.i.d..  Review of systems shows he had a spot on the left side of his gum removed by his dentist.  Benign follow-up q.4 months because of some atypical cells also some mild hearing loss recommended.  An audiogram.  He also has come concerns about his memory,,,,,He states mostly its short-term.  No  family history of any neurologic problems.  Recommend a neurologic consultation for further evaluation.  He also takes Motrin 600 mg b.i.d. For osteoarthritis   Review of Systems  Constitutional: Negative.   HENT: Negative.   Eyes: Negative.   Respiratory: Negative.   Cardiovascular: Negative.   Gastrointestinal: Negative.   Genitourinary: Negative.   Musculoskeletal: Negative.   Skin: Negative.   Neurological: Negative.   Hematological: Negative.   Psychiatric/Behavioral: Negative.        Objective:   Physical Exam  Constitutional: He is oriented to person, place, and time. He appears well-developed and well-nourished.  HENT:  Head: Normocephalic and atraumatic.  Right Ear: External ear normal.  Left Ear: External ear normal.  Nose: Nose normal.  Mouth/Throat: Oropharynx is clear and moist.  Eyes: Conjunctivae and EOM are normal. Pupils are equal, round, and reactive to light.  Neck: Normal range of motion. Neck supple. No JVD present. No tracheal deviation present. No thyromegaly present.  Cardiovascular:  Normal rate, regular rhythm, normal heart sounds and intact distal pulses.  Exam reveals no gallop and no friction rub.   No murmur heard. Pulmonary/Chest: Effort normal and breath sounds normal. No stridor. No respiratory distress. He has no wheezes. He has no rales. He exhibits no tenderness.  Abdominal: Soft. Bowel sounds are normal. He exhibits no distension and no mass. There is no tenderness. There is no rebound and no guarding.  Genitourinary: Rectum normal, prostate normal and penis normal. Guaiac negative stool. No penile tenderness.  Musculoskeletal: Normal range of motion. He exhibits no edema and no tenderness.  Lymphadenopathy:    He has no cervical adenopathy.  Neurological: He is alert and oriented to person, place, and time. He has normal reflexes. No cranial nerve deficit. He exhibits normal muscle tone.  Skin: Skin is warm and dry. No rash noted. No erythema. No pallor.  Psychiatric: He has a normal mood and affect. His behavior is normal. Judgment and thought content normal.          Assessment & Plan:  Hyperlipidemia,,,,,,,,, lipids are ago with simvastatin 20 mg daily,,,,,,,, decreased as to one Monday, Wednesday, Friday, because of concerns of  memory problems.  Memory concerns,,,,,,,,,,,, neuro- consult with Dr. Porfirio Mylar D..  History of ulcerative colitis asymptomatic on current medication.  History of HSV.  Continue acyclovir 500 b.i.d.  Hang loss.  Recommend audiogram

## 2010-09-05 NOTE — Patient Instructions (Signed)
Decreased the simvastatin to one tablet Monday, Wednesday, Friday.  I will call the neurology office today to set up for a consult with Dr. Percell Miller.  Terry at [place], and nose, and throat for an audiogram.  Follow-up in two months with a fasting lipid panel.  Motrin 600 mg twice daily for muscle and joint pain

## 2010-09-27 ENCOUNTER — Ambulatory Visit (INDEPENDENT_AMBULATORY_CARE_PROVIDER_SITE_OTHER): Payer: BC Managed Care – PPO | Admitting: Family Medicine

## 2010-09-27 ENCOUNTER — Encounter: Payer: Self-pay | Admitting: Family Medicine

## 2010-09-27 VITALS — BP 120/80 | Temp 98.6°F

## 2010-09-27 DIAGNOSIS — L02519 Cutaneous abscess of unspecified hand: Secondary | ICD-10-CM

## 2010-09-27 DIAGNOSIS — L03119 Cellulitis of unspecified part of limb: Secondary | ICD-10-CM

## 2010-09-27 MED ORDER — AMOXICILLIN 875 MG PO TABS: 875.0000 mg | ORAL_TABLET | Freq: Two times a day (BID) | ORAL | Status: DC

## 2010-09-27 MED ORDER — AMOXICILLIN-POT CLAVULANATE 875-125 MG PO TABS: 1.0000 | ORAL_TABLET | Freq: Two times a day (BID) | ORAL | Status: AC

## 2010-09-27 NOTE — Patient Instructions (Signed)
Elevate hand frequently.  Consider heating pad to R hand few times daily.

## 2010-09-27 NOTE — Progress Notes (Signed)
  Subjective:    Patient ID: [name], male    DOB: 03-18-1956, 55 y.o.   MRN: [id number]  HPI  patient seen with nail puncture to right hand dorsal proximal to middle finger yesterday. Nail was not rusty. Tetanus 2008. Minimal bleeding afterwards. Doing well until this morning when he noticed swelling and redness and warmth. Minimal tenderness. No fever or chills.   Review of Systems     Objective:   Physical Exam  patient alert and in no distress Chest clear auscultation Heart regular rhythm and rate Extremities right hand reveals edema dorsally. Area of erythema involving most of the dorsum of the hand and extending down to the PIP joint of the middle finger. Full range of motion all joints of the hand. Minimally tender. No fluctuance. No drainage.       Assessment & Plan:   cellulitis right hand following puncture wound. Tetanus up-to-date. Start Augmentin 875 mg twice a day for 10 days. Elevate frequently.  heat locally. Followup promptly for any fever or worsening symptoms

## 2010-10-25 ENCOUNTER — Other Ambulatory Visit (INDEPENDENT_AMBULATORY_CARE_PROVIDER_SITE_OTHER): Payer: BC Managed Care – PPO | Admitting: Family Medicine

## 2010-10-25 DIAGNOSIS — E785 Hyperlipidemia, unspecified: Secondary | ICD-10-CM

## 2010-10-25 DIAGNOSIS — E782 Mixed hyperlipidemia: Secondary | ICD-10-CM

## 2010-10-25 LAB — LDL CHOLESTEROL, DIRECT: Direct LDL: 131 mg/dL

## 2010-10-25 LAB — LIPID PANEL: Total CHOL/HDL Ratio: 4

## 2010-11-01 ENCOUNTER — Ambulatory Visit: Payer: BC Managed Care – PPO | Admitting: Family Medicine

## 2010-11-01 ENCOUNTER — Ambulatory Visit (INDEPENDENT_AMBULATORY_CARE_PROVIDER_SITE_OTHER): Payer: BC Managed Care – PPO | Admitting: Family Medicine

## 2010-11-01 ENCOUNTER — Encounter: Payer: Self-pay | Admitting: Family Medicine

## 2010-11-01 VITALS — BP 120/76 | Temp 98.2°F | Wt 187.0 lb

## 2010-11-01 DIAGNOSIS — E782 Mixed hyperlipidemia: Secondary | ICD-10-CM

## 2010-11-01 NOTE — Progress Notes (Signed)
  Subjective:    Patient ID: [name], male    DOB: 30-May-1956, 55 y.o.   MRN: [id number]  [name] is a 55 year old, married male, nonsmoker, who comes in today for follow-up of hyperlipidemia.  He was on simvastatin 20 mg nightly however, he was experiencing symptoms of memory change.  We, therefore decrease the dose to 20 mg Monday, Wednesday, Friday.  He comes back today for follow-up stating he feels about the same.  Lipids continue to be fairly normal on the 20 mg 3 times weekly.  We discussed for his options.  He would like to stop it completely.    Review of Systems    General and metabolic review of systems otherwise negative Objective:   Physical Exam    Well-developed well-nourished man no acute distress    Assessment & Plan:  Hyperlipidemia question CNS side effects from a statin,,,,,,,,,,, stop  the statin completely follow-up office visit in lipid panel in 3 months

## 2010-11-01 NOTE — Patient Instructions (Signed)
Stop the Zocor, completely.  Stay away from fatty foods, walk 15 minutes daily.  Follow-up office visit in 3 months fasting lipid panel one week prior

## 2010-11-21 NOTE — Assessment & Plan Note (Signed)
[facility]                         GASTROENTEROLOGY OFFICE NOTE   ZIARE, CRYDER                        MRN:          [id number]  DATE:01/08/2007                            DOB:          [birth year]    [name] is Dr. Nelida Meuse patient.  He was diagnosed with ulcerative  colitis in November of 2007.  It involved his left colon.  He was  initially quite resistant to medical therapy and it took about 2 months  for him to start going into remission.  He is in complete remission now  and has been so now for about 8 Keng.  His bowel movements are regular.  He denies any rectal bleeding, no diarrhea or abdominal pain.  He has  been able to taper off his prednisone.  He has been off prednisone for  over 2 months and remains on Imuran 50 mg a day and Colazal 750 mg 3  tablets twice a day.  He is interested in getting off the Imuran because  of its immunosuppressive effects as well as increased incidence of  cancer.   PHYSICAL EXAMINATION:  Blood pressure 114/70, pulse 76, weight 182  pounds.  He appeared healthy, in no distress.  LUNGS:  Clear to auscultation.  COR:  Normal S1, normal S2.  ABDOMEN:  Soft, nontender, with normoactive bowel sounds.  RECTAL EXAM:  Not done.   IMPRESSION:  A 55 year old gentleman with __________ ulcerative colitis,  currently in remission, off steroids, on maintenance therapy.   PLAN:  We discussed back and forth stopping his Imuran.  I think we will  go ahead and stop the Imuran and continue him on the Colazal, total of 6  tablets a day.  There is a slight risk of him flaring up, but I believe  the benefits of stopping it exceed the risk of his immunosuppression.  As long as he stays on the Colazal, hopefully, we will keep him in  remission.  He can continue on high-fiber diet, taking metamucil twice a  day.  I will see him again in 3 months.  He will follow up with Dr. Tawanna Cooler  for his general medical care.     [name]. Jeffery Chance, MD  Electronically Signed    DMB/MedQ  DD: 01/08/2007  DT: 01/08/2007  Job #: [id number]   cc:   Tinnie Gens A. Tawanna Cooler, MD

## 2010-11-21 NOTE — Assessment & Plan Note (Signed)
[facility]                         GASTROENTEROLOGY OFFICE NOTE   NAME:[name], [name]                        MRN:          [id number]  DATE:04/10/2007                            DOB:          Mar 20, 1956    Mr. Lofaso is a 55 year old gentleman with ulcerative colitis which  flared approximately a year ago and was quite resistant to medical  treatment.  He finally went into remission of February of this year and  has been steadily improving.  We were able to decrease and finally stop  his steroids and subsequently even stopped his immunomodulator Imuran,  he has been off Imuran for several months continuing on Colazal 750 mg  six tablets a day.  He has no symptoms of diarrhea, abdominal pain,  rectal bleeding or urgency.  His level of energy has been good.  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   PHYSICAL EXAMINATION:  Blood pressure 116/68, pulse 60 and weight 187  pounds.  He was alert, oriented, in no distress.  Patient was not re-  examined today.   IMPRESSION:  A 55 year old gentleman with ulcerative colitis in  remission on maintenance therapy of Colazal.   PLAN:  1. Continue Colazal at 750 mg two p.o. b.i.d., one year refill.  2. Repeat colonoscopy October 2011.  3. Office visit in one year, he will let us know if there are any new      GI symptoms.     [name]. [name], MD  Electronically Signed    DMB/MedQ  DD: 04/10/2007  DT: 04/10/2007  Job #: [id number]   cc:   Tinnie Gens A. Tawanna Cooler, MD

## 2010-11-24 NOTE — Assessment & Plan Note (Signed)
[name]                         GASTROENTEROLOGY OFFICE NOTE   NAME:[name], [name]                        MRN:          [id number]  DATE:08/07/2006                            DOB:          [birth year]    [name] is Jeffery 55 year old gentleman with exacerbation of ulcerative  colitis.  So far the exacerbation has been resistant to medical therapy.  I saw him 4 [name] ago, and during the time patient has not gotten any  better.  In fact, he is somewhat worse, having more bleeding, urgency  and 2 episodes of incontinence.  He still works full time, but has to be  close to the bathroom.  He has eliminated caffeine, alcohol and drinks  only water.  He has resumed his Metamucil, because in his opinion it  makes the stools more solid.  He had several episodes of nocturnal bowel  movements.  There is blood in his stool on every occasion.  There has  been no fever or abdominal pain.  He generally has not felt good.   MEDICATIONS:  1. Robinul forte 2 mg twice Jeffery day.  2. Imuran 75 mg Jeffery day.  3. Prednisone 15 mg twice Jeffery day.  4. Isocal 400 mg twelve tablets Jeffery day.  5. Cortenema nightly, ran out 2 days ago   PHYSICAL EXAMINATION:  Blood pressure 120/80.  Pulse 86.  Weight 181  pounds, which is Jeffery stable weight.  Patient does not appear to be cushingoid, but he has acne on his neck  and Jeffery little bit on his face.  There is no resting tremor.  LUNGS:  Clear to auscultation.  COR:  With rapid S1 and S2.  ABDOMEN:  Soft, non-tender with normoactive bowel sounds.  No tenderness  in any of the quadrants of the abdomen.  No distention.  RECTAL AND ANOSCOPIC:  Exam shows friable mucosa of the rectum with  spontaneous bleeding suggestive of severe proctitis.  Jeffery large amount of  blood in the rectal ampulla.  No hemorrhoids.   IMPRESSION:  Left-sided ulcerative colitis refractory to medical  therapy.   PLAN:  1. I have discussed at length plan for further treatment,  adding rest,      cutting back on his work load and stress.  We also talked about      other measures such as Remicade or Humira.  Also, the fact that      Asacal can sometimes exacerbate colitis.  For that, we simply may      switch to some other antiinflammatory medication such as Colazal  2. Increase prednisone to 40 mg Jeffery day.  3. Switch to Colazal 750 mg 3 tablets three times Jeffery day.  4. Decrease Imuran to 50 mg Jeffery day, just in case the Imuran is causing      his arthralgias  5. Cortenema 1 nightly.  6. Today, CBC, sed rate, CMET and TSH levels.  I will see him again in      2 [place].     [name]. Jeffery Chance, MD  Electronically Signed    [name]/[name]  DD: 08/07/2006  DT: 08/07/2006  Job #: [id number]   cc:   [name] Jeffery. Jeffery Cooler, MD

## 2010-11-24 NOTE — Assessment & Plan Note (Signed)
[facility]                         GASTROENTEROLOGY OFFICE NOTE   NAME:WEEKSJerald, Hennington                        MRN:          [id number]  DATE:08/21/2001                            DOB:          11-22-1955    Mr. Coluccio is a 55 year old gentleman with severe ulcerative  pancolitis  on maximum medical therapy, coming back after 2 Recupero of being on high-  dose steroids.  He is finally starting to feel better since his last  appointment on August 07, 2006.  He has improved only in the last 3  days.  The stools are starting to form.  The bleeding has improved about  75%.  Overall, he is feeling better.  The arthralgias were probably  related to the Imuran, because they disappeared after we decreased the  Imuran from 75 mg to 50 mg a day.  He works out every morning, but  despite of it, has gained about 2 pounds.  He also has had some  gastroesophageal reflux symptoms, which are related to prednisone.   MEDICATIONS:  1. Prednisone 20 mg in the morning, 10 mg at night.  2. __________ at night.  3. Colazal  750 mg 3 three times a day total 9/day  4. Imuran 50 mg a day.  5. Robinul Fortel 2 mg twice a day.  6. He also takes Xanax 0.25 mg only p.r.n., usually 2 or 3 times a      week.   PHYSICAL EXAMINATION:  Blood pressure 110/70.  Pulse 84.  Weight 182  pounds.  He appears slightly cushingoid.  Alert and oriented.  LUNGS:  Clear to auscultation.  COR:  With a rapid S1 and S2.  ABDOMEN:  Was soft and non-tender with normoactive bowel sounds.  RECTAL EXAM:  Not done today.  EXTREMITIES:  No edema.   IMPRESSION:  A 55 year old white male with severe ulcerative  pancolitis  slowly getting into remission.  He is still quite symptomatic, and on  maximum medical therapy, but I think he is starting to turn the corner.   PLAN:  He is quite interested in cutting down on his prednisone.  1. Decrease prednisone to 30 mg daily.  2. Continue all other medications  including Cort enemas.  If he does      well by next week, I advised him to decreased his Cort enemas to      every other night.  I have also noted that his blood sugar was 109      fasting last time, so we are going to repeat his fasting blood      sugar      and hemoglobin A1c.  3. I will see him again in 4 [place].  I am planning to keep him on 30      mg of prednisone for at least 4 [place]baum before we cut down.     [name]. [name], MD  Electronically Signed    DMB/MedQ  DD: 08/21/2006  DT: 08/21/2006  Job #: [id number]

## 2010-11-24 NOTE — Procedures (Signed)
[name], [name] NO.:  [id number]   MEDICAL RECORD NO.:  [id number]          PATIENT TYPE:  OUT   LOCATION:  SLEEP CENTER                 FACILITY:  [facility]   PHYSICIAN:  Barbaraann Share, MD,FCCPDATE OF BIRTH:  1956-07-08   DATE OF STUDY:  08/26/2006                            NOCTURNAL POLYSOMNOGRAM   REFERRING PHYSICIAN:  Barbaraann Share, MD,FCCP   INDICATION FOR STUDY:  Hypersomnia with sleep apnea.   EPWORTH SLEEPINESS SCORE:  15.   SLEEP ARCHITECTURE:  The patient had a total sleep time of 348 minutes  with only 3 minutes of slow wave sleep and 25 minutes of REM.  Sleep  onset latency was normal, and REM onset was fairly rapid at 66 minutes.  Sleep efficiency was excellent at 94%.   RESPIRATORY DATA:  The patient was found to have 106 hypopneas and 76  apneas and 1 central apnea for an apnea/hypopnea index of 32 events per  hour.  The events occurred almost exclusively in the supine position,  and there was very loud snoring noted throughout.   OXYGEN DATA:  There was oxygen desaturation as low as 74% with the  patient's obstructive events.   CARDIAC DATA:  No clinically significant cardiac arrhythmias were noted.   MOVEMENT-PARASOMNIA:  Small numbers of leg jerks without significant  sleep disruption. There was no abnormal behaviors.   IMPRESSIONS-RECOMMENDATIONS:  Moderate obstructive sleep apnea/hypopnea  syndrome with apnea/hypopnea index of 32 events per hour and O2  saturation as low as 74%.  The events occurred almost exclusively in the  supine position.  Treatment for this degree of sleep apnea can include  weight loss alone, upper airway surgery or appliance, and also CPAP.  Clinical correlation is suggested.      Barbaraann Share, MD,FCCP  Diplomate, American Board of Sleep  Medicine  Electronically Signed     [facility]/MEDQ  D:  09/08/2006 11:25:47  T:  09/08/2006 19:58:44  Job:  [id number]

## 2010-11-24 NOTE — Assessment & Plan Note (Signed)
[facility]                           GASTROENTEROLOGY OFFICE NOTE   NAME:[name], [name]                        MRN:          [id number]  DATE:04/30/2006                            DOB:          01-10-1956    [name] is a 55 year old gentleman with ulcerative colitis by history who  has been in long remission. In fact, his last until recently. In fact,  during his last colonoscopy in October 2006, there was no microscopic or  endoscopic evidence of inflammatory bowel disease. He has done well until  approximately six Capri ago when after eating at a [employer],  developed mucus and bloody stools which persisted. He treated himself with  increasing Metamucil for 2-3 Tschantz without much improvement. Dr. Tawanna Cooler  advised starting Asacol two tablets three times a day, which he took for two  [place] without much improvement. He subsequently was put on 40 mg of  prednisone by Dr. Tawanna Cooler and was on it for ten days again with persistent  mucus and bloody stools. The stools are actually soft or formed, but there  is always mucus. He has 2-3 bowel movements in the morning, but not at  night. In the last two days he himself has increased the prednisone to 60 mg  a day.   PHYSICAL EXAMINATION:  Blood pressure: 120/80. Pulse: 64. Weight: 178  pounds. He was alert, oriented and in no distress.  Sclera nonicteric.  LUNGS: Clear to auscultation.  COR: Normal S1, normal S2.  ABDOMEN: Soft and nontender. Normoactive bowel sounds. No distention.  Anoscopic examination shows friable mucosa of the anal canal. Normal peri-  rectal area. Normal rectal tone. There was contact bleeding as well as  spontaneous bleeding from the rectal ampulla. Stool was heme positive.   IMPRESSION:  Ulcerative colitis, left-sided. Friable mucosa on the anoscopic  examination indicates at least proctitis, possibly left-sided colitis.   PLAN:  1. Prednisone 30 mg a day.  2. Proctocort  b.i.d.  3. Increase Asacol to total of 4.8 grams a day in four divided doses.  4. Flexible sigmoidoscopy scheduled to assess the extent of the disease.       [name]. [name], MD   DMB/MedQ DD:  04/30/2006 DT:  04/30/2006 Job #:  [id number]   cc:   Tinnie Gens A. Tawanna Cooler, MD

## 2010-11-24 NOTE — Assessment & Plan Note (Signed)
[facility]                         GASTROENTEROLOGY OFFICE NOTE   Karon, Heckendorn [employer] BANAS                        MRN:          [id number]  DATE:10/14/2006                            DOB:          [birth year]    Mr. Cardinal is a 55 year old gentleman with   DICTATION ENDED AT THIS POINT     Dora M. [name], MD  Electronically Signed    DMB/MedQ  DD: 10/14/2006  DT: 10/14/2006  Job #: [id number]   cc:   Tinnie Gens A. Tawanna Cooler, MD

## 2010-11-24 NOTE — Assessment & Plan Note (Signed)
[facility]                         GASTROENTEROLOGY OFFICE NOTE   NAME:[name], [name]                        MRN:          [id number]  DATE:10/14/2006                            DOB:          11-May-1956    Mr. Grant is a 55 year old gentleman with newly diagnosed ulcerative  colitis in the fall of 2007 who had rather severe exacerbation requiring  high doses of steroids, mesalamine products, and immunomodulator.  He is  finally going into remission.  Since his last appointment on August 07, 2006, his stool frequency has decreased.  He no longer sees blood in his  stools which are formed.  There has been no nocturnal bowel movements  and only once or twice a week an urgent bowel movement, especially in  the mornings.  He has been able to taper off his prednisone and  completely stop it one week ago.  His level of energy is fine.  He  denies any joint pains.   MEDICATIONS:  1. Prednisone, discontinued one week ago.  2. Colazal 750 mg, three tablets three times a day.  About twice a      week he forgets the mid-day dose.  3. Imuran 50 mg daily.  He was on 75 mg but did decrease to 50 because      of arthralgias.  4. He discontinued Cortenemas and takes Robinul Forte 2 mg twice a      day.   PHYSICAL EXAMINATION:  VITAL SIGNS:  Blood pressure 118/82, pulse 68,  weight 186 pounds.  GENERAL:  He appears slightly cushingoid.  Alert and oriented.  No  distress.  The patient was not reexamined today.   IMPRESSION:  55 year old gentleman with ulcerative colitis involving  mostly the left colon, confirmed on biopsy.  Last exam on May 29, 2006.  He is currently in symptomatic remission, about 90% improved, off  steroids.   PLAN:  1. Discussion concerning increasing Imuran to 75 mg a day.  The      patient would prefer to stay on 50 at this time which is a      subtherapeutic  dose,  because of its possible side      effects.(arthralgias)  2.  Continue Colazal, three tablets three times a day.  3. Continue all other medications.  4. I will see him again in three months.  We will check CBC today and      sed rate.     [name]. [name], MD  Electronically Signed    DMB/MedQ  DD: 10/14/2006  DT: 10/14/2006  Job #: [id number]   cc:   Tinnie Gens A. Tawanna Cooler, MD

## 2010-11-24 NOTE — Assessment & Plan Note (Signed)
[facility]                             PULMONARY OFFICE NOTE   NAME:[name], [name]                        MRN:          [id number]  DATE:07/31/2006                            DOB:          [birth year]    HISTORY OF PRESENT ILLNESS:  The patient is a 55 year old male who I  have been asked to see for possible sleep apnea.  The patient states he  has been told that he is a very loud snorer but no one has ever  mentioned pauses in his breathing during sleep.  He denies any snoring  arousals or gasping episodes during the night.  The patient states that  he typically goes to bed between 10 and 11 p.m. and gets up at 4:30 a.m.  to start his day.  He awakens at least 3 to 5 times a night because he  believes his wife is elbowing him to stop snoring.  The patient feels  that he is rested most of the time whenever he arises.  The patient  works as a Programmer, systems and denies inappropriate daytime  sleepiness while at work.  However, he stays very active and does not  have a lot of time for inactivity.  He does state after lunch, however,  he will park the car around the corner from his job site and may take a  20-minute nap.  The patient states in the evenings he does fall asleep  very, very easily with watching TV or movies.  Of note, his weight has  only increased by about 3 to 4 pounds over the last few years.   PAST MEDICAL HISTORY:  Significant for dyslipidemia, history of colitis,  otherwise unremarkable.   CURRENT MEDICATIONS:  Include:  1. Zocor 20 mg daily.  2. Asacol 1200 mg q.i.d.  3. Multiple herbal and over-the-counter preparations.  4. Prednisone 50 mg b.i.d.  5. Azathioprine 75 mg daily.  6. Glycopyrrolate b.i.d.   The patient has an ALLERGY TO SULFA.   The patient is married, has a history of smoking up to 2 packs of  cigarettes per day for 15 years.  He has not smoked since 1990.   FAMILY HISTORY:  Remarkable for his  father having had heart disease,  mother having had breast cancer with metastatic deposits.   REVIEW OF SYSTEMS:  As per history of present illness.  Also see  inpatient form documented in the chart.   PHYSICAL EXAM:  GENERAL:  He is a well-developed male in no acute  distress.  Blood pressure is 120/78.  Pulse is 91.  Temperature is 98.3.  Weight is  180 pounds.  He is 5 feet 6 inches tall.  O2 saturation on room air is  95%.  HEENT:  Pupils equal round and reactive to light and accommodation.  Extraocular muscles are intact.  Nares are mildly narrowed.  Oropharynx  does show mild elongation of the soft palate and uvula.  NECK:  Supple without JVD or lymphadenopathy.  There is no palpable  thyromegaly.  CHEST:  Totally clear.  CARDIAC:  Regular rate and rhythm.  No murmurs, rubs, or gallops.  ABDOMEN:  Soft, nontender with good bowel sounds.  GENITAL, RECTAL, BREAST:  Not done and not indicated.  Lower extremities are without edema.  Pulses are intact distally.  Neurologically, alert and oriented with no obvious motor deficits.   IMPRESSION:  Questionable obstructive sleep apnea.  A lot of the  patient's history is very suspicious for sleep apnea, however, some of  it is not at all.  The patient clearly does have some degree of  inappropriate daytime sleepiness and also has difficulty finishing a  movie or a television program in the evening.  I really think at this  point in time there is enough suspicion and concern by his wife to go  ahead and proceed with nocturnal polysomnography.  The patient is  agreeable to this approach.   PLAN:  1. Work on weight loss of about 10 to 15 pounds.  2. Schedule for nocturnal polysomnogram.  3. The patient will follow up after the above.     Barbaraann Share, MD,FCCP  Electronically Signed    [facility]/MedQ  DD: 08/05/2006  DT: 08/06/2006  Job #: [id number]   cc:   Tinnie Gens A. Tawanna Cooler, MD

## 2010-11-24 NOTE — Assessment & Plan Note (Signed)
[facility]                         GASTROENTEROLOGY OFFICE NOTE   NAME:Jeffery [name] [name], Jeffery [name] [name]                        MRN:          [id number]  DATE:07/04/2006                            DOB:          1955/08/24    HISTORY OF PRESENT ILLNESS:  Mr. Jeffery [name] [name] is a 55 year old gentleman with  exacerbation of ulcerative proctitis documented on flexible  sigmoidoscopy on May 29, 2006, showing active positive colitis from  0-30 cm.  Biopsies showed chronic active colitis without dysplasia.  Biopsies in the descending colon showed no active inflammation.  The  patient has not responded to prednisone, Proctofoam as well as high dose  of Asacol at 4.8 g per day.  He still has urgency, passage of mucous,  some blood.   PHYSICAL EXAMINATION:  VITAL SIGNS:  Blood pressure 122/68, pulse 80,  weight 181 pounds.  ABDOMEN:  Soft.  Normoactive bowel sounds.  Nontender.  Nondistended.  COR:  No S1, S2.  LUNGS:  Clear to auscultation.  RECTAL:  Normal perianal area.  Normal rectal tone.  Small amount of  stool which was heme positive.   IMPRESSION:  A 55 year old gentleman with persistent onset of left sided  colitis.  Not responsive to medium dose of prednisone.   PLAN:  1. Begin Cort  enemas q.h.s.  2. Begin Imuran 75 mg daily.  3. Xanax 0.25 mg bid prn  4  Increase Prednisone to 30 mg qd  1. Robinul Forte 2 mg p.o. b.i.d. p.r.n. cramps/abdominal pain.  2. Refill for Asacol 400 mg total of 4.8 g per day in three divided      doses.  3. The patient will return in four [place].     [name]. [name], MD  Electronically Signed    DMB/MedQ  DD: 07/04/2006  DT: 07/04/2006  Job #: [id number]   cc:   Tinnie Gens A. Tawanna Cooler, MD

## 2011-01-03 ENCOUNTER — Other Ambulatory Visit (INDEPENDENT_AMBULATORY_CARE_PROVIDER_SITE_OTHER): Payer: BC Managed Care – PPO

## 2011-01-03 ENCOUNTER — Other Ambulatory Visit: Payer: Self-pay | Admitting: Family Medicine

## 2011-01-03 DIAGNOSIS — E782 Mixed hyperlipidemia: Secondary | ICD-10-CM

## 2011-01-03 LAB — LIPID PANEL
Cholesterol: 225 mg/dL — ABNORMAL HIGH (ref 0–200)
HDL: 62.8 mg/dL (ref >39.00)
Total CHOL/HDL Ratio: 4
Triglycerides: 52 mg/dL (ref 0.0–149.0)

## 2011-01-03 LAB — LDL CHOLESTEROL, DIRECT: Direct LDL: 136.6 mg/dL

## 2011-01-17 ENCOUNTER — Encounter: Payer: Self-pay | Admitting: Family Medicine

## 2011-01-17 ENCOUNTER — Ambulatory Visit (INDEPENDENT_AMBULATORY_CARE_PROVIDER_SITE_OTHER): Payer: BC Managed Care – PPO | Admitting: Family Medicine

## 2011-01-17 DIAGNOSIS — E782 Mixed hyperlipidemia: Secondary | ICD-10-CM

## 2011-01-17 NOTE — Progress Notes (Signed)
  Subjective:    Patient ID: WILLIAN DONSON, male    DOB: [birth year], 55 y.o.   MRN: [id number]  HPI  [name] is a 55 year old man male, nonsmoker, who comes in today for follow-up of hyperlipidemia.  When we get his physical exam in April.  He was on 20 mg of Zocor daily.  Lipids were fairly normal however, he was concerned about some issues with memory loss.  At that time he thought it was related to the medication.  Therefore, we stopped the medication.  We also set him up for a neurologic consult, which he canceled.  He feels like his memory problems are related to the fact that he has many things going on in his life.  His wife, who is a Engineer, civil (consulting) gave him some short-term things to remember and he remembered all those things.  His lipids off his medications are still fairly normal.  They really haven't changed.  All that much.    Review of Systems General metabolic neurologic review of systems otherwise negative    Objective:   Physical Exam    Well-developed well-nourished, male in no acute distress    Assessment & Plan:  Hyperlipidemia controlled with diet.  No evidence of short-term memory loss.  Recommend he continue the diet and exercise to control his lipids would not take any medication

## 2011-01-17 NOTE — Patient Instructions (Signed)
Continue the diet and exercise to control your lipids.  Follow-up in one year, sooner if any problems

## 2011-02-09 ENCOUNTER — Other Ambulatory Visit: Payer: Self-pay | Admitting: Internal Medicine

## 2011-07-17 ENCOUNTER — Encounter: Payer: Self-pay | Admitting: Family Medicine

## 2011-07-17 ENCOUNTER — Ambulatory Visit (INDEPENDENT_AMBULATORY_CARE_PROVIDER_SITE_OTHER): Payer: BC Managed Care – PPO | Admitting: Family Medicine

## 2011-07-17 DIAGNOSIS — H903 Sensorineural hearing loss, bilateral: Secondary | ICD-10-CM

## 2011-07-17 NOTE — Progress Notes (Signed)
  Subjective:    Patient ID: [name], male    DOB: January 27, 1956, 56 y.o.   MRN: [id number]  HPI [name] is a 56 year old, married male, nonsmoker, who comes in today for evaluation of hearing loss.  He states over the last 10, years he's noticed decrease in his hearing, pain.  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  There has been no acute changes pending.  Gradual.  It involves both ears for you would like to discuss his options for   Review of Systems    General an ENT review of systems otherwise negative Objective:   Physical Exam Well-developed well-nourished, male in no acute distress.  Examination of the head, eyes, ears, nose, and throat were negative, specifically, the ear canals were normal TMs were normal no fluid.         Assessment & Plan:  Bilateral gradual hearing loss.  Recommend audiology consult at [facility]

## 2011-07-17 NOTE — Patient Instructions (Signed)
Considered going to [facility]  into the audiogram evaluation

## 2011-07-27 ENCOUNTER — Other Ambulatory Visit: Payer: Self-pay | Admitting: Internal Medicine

## 2011-08-13 ENCOUNTER — Encounter: Payer: Self-pay | Admitting: *Deleted

## 2011-08-21 ENCOUNTER — Encounter: Payer: Self-pay | Admitting: Internal Medicine

## 2011-08-21 ENCOUNTER — Ambulatory Visit (INDEPENDENT_AMBULATORY_CARE_PROVIDER_SITE_OTHER): Payer: BC Managed Care – PPO | Admitting: Internal Medicine

## 2011-08-21 ENCOUNTER — Other Ambulatory Visit (INDEPENDENT_AMBULATORY_CARE_PROVIDER_SITE_OTHER): Payer: BC Managed Care – PPO

## 2011-08-21 ENCOUNTER — Telehealth: Payer: Self-pay | Admitting: *Deleted

## 2011-08-21 DIAGNOSIS — K625 Hemorrhage of anus and rectum: Secondary | ICD-10-CM

## 2011-08-21 DIAGNOSIS — K5100 Ulcerative (chronic) pancolitis without complications: Secondary | ICD-10-CM

## 2011-08-21 LAB — CBC WITH DIFFERENTIAL/PLATELET
Basophils Relative: 0.8 % (ref 0.0–3.0)
Eosinophils Relative: 1.2 % (ref 0.0–5.0)
HCT: 44.3 % (ref 39.0–52.0)
Lymphs Abs: 1.6 10*3/uL (ref 0.7–4.0)
MCV: 97.9 fl (ref 78.0–100.0)
Monocytes Absolute: 0.6 10*3/uL (ref 0.1–1.0)
Neutrophils Relative %: 62.2 % (ref 43.0–77.0)
RBC: 4.53 Mil/uL (ref 4.22–5.81)
WBC: 6 10*3/uL (ref 4.5–10.5)

## 2011-08-21 MED ORDER — BALSALAZIDE DISODIUM 750 MG PO CAPS: ORAL_CAPSULE | ORAL | Status: DC

## 2011-08-21 NOTE — Telephone Encounter (Signed)
Left a message for patient to call me back.

## 2011-08-21 NOTE — Progress Notes (Signed)
[name] April 06, 1956 MRN [id number]   History of Present Illness:  This is a 56 year old white male with ulcerative colitis initially diagnosed in 2002 with a recurrent flareup in 2006. His last colonoscopy in October 2011 showed a normal colon. He has decreased his Colazal to 750 mg to 1 daily. He has recently noted a small amount of bright red blood on toilet tissue but denies any diarrhea, change in bowel habits, or urgency. He denies abdominal pain.    Past Medical History  Diagnosis Date  . Anxiety   . Neoplasm of face     benign  . Sleep apnea   . Hyperlipidemia   . Ulcerative colitis   . Allergy    Past Surgical History  Procedure Date  . Hernia repair   . Amputation     right fourth toe  . Shoulder arthrotomy     reports that he has quit smoking. He has never used smokeless tobacco. He reports that he drinks alcohol. He reports that he does not use illicit drugs. family history includes Breast cancer in an unspecified family member; Colon cancer in an unspecified family member; Diabetes in an unspecified family member; Heart attack in his father; Hyperlipidemia in an unspecified family member; Lung cancer in an unspecified family member; and Thyroid disease in an unspecified family member. Allergies  Allergen Reactions  . Sulfamethoxazole     REACTION: unspecified        Review of Systems:Negative for dysphagia, heartburn, abdominal pain or change in bowel habits  The remainder of the 10 point ROS is negative except as outlined in H&P   Physical Exam: General appearance  Well developed, in no distress. Eyes- non icteric. HEENT nontraumatic, normocephalic. Mouth no lesions, tongue papillated, no cheilosis. Neck supple without adenopathy, thyroid not enlarged, no carotid bruits, no JVD. Lungs Clear to auscultation bilaterally. Cor normal S1, normal S2, regular rhythm, no murmur,  quiet precordium. Abdomen: Soft nontender abdomen with normal active bowel  sounds.  Rectal:No perianal disease. Normal rectal sphincter tone. Soft Hemoccult negative stool  Extremities no pedal edema. Skin no lesions. Neurological alert and oriented x 3. Psychological normal mood and affect.  Assessment and Plan:  Problem #1 Ulcerative colitis in remission. A small amount of rectal bleeding recently was not significant. He will continue on a maintenance dose of Colazal 750 po bid. He will have a complete physical exam with Dr Tawanna Cooler next month. We will check a CBC today. His recall colonoscopy is due in October 2016.    08/21/2011 [name]

## 2011-08-21 NOTE — Patient Instructions (Signed)
Your physician has requested that you go to the basement for the following lab work before leaving today: CBC We have sent the following medications to your pharmacy for you to pick up at your convenience: Colazal CC: Dr Jim Desanctis

## 2011-08-21 NOTE — Telephone Encounter (Signed)
Message copied by Daphine Deutscher on Tue Aug 21, 2011  3:39 PM ------      Message from: Hart Carwin      Created: Tue Aug 21, 2011  1:21 PM       Please call pt with  Normal CBC

## 2011-08-21 NOTE — Telephone Encounter (Signed)
Spoke with patient and gave him results as per Dr. Brodie.  

## 2011-09-03 ENCOUNTER — Other Ambulatory Visit (INDEPENDENT_AMBULATORY_CARE_PROVIDER_SITE_OTHER): Payer: BC Managed Care – PPO

## 2011-09-03 DIAGNOSIS — Z0000 Encounter for general adult medical examination without abnormal findings: Secondary | ICD-10-CM

## 2011-09-03 LAB — CBC WITH DIFFERENTIAL/PLATELET
Basophils Absolute: 0.1 10*3/uL (ref 0.0–0.1)
Eosinophils Absolute: 0.1 10*3/uL (ref 0.0–0.7)
HCT: 45.3 % (ref 39.0–52.0)
Lymphs Abs: 1.4 10*3/uL (ref 0.7–4.0)
MCHC: 33.7 g/dL (ref 30.0–36.0)
Monocytes Absolute: 0.7 10*3/uL (ref 0.1–1.0)
Monocytes Relative: 11.3 % (ref 3.0–12.0)
Platelets: 289 10*3/uL (ref 150.0–400.0)
RDW: 13.1 % (ref 11.5–14.6)

## 2011-09-03 LAB — LIPID PANEL
Cholesterol: 227 mg/dL — ABNORMAL HIGH (ref 0–200)
HDL: 63.2 mg/dL (ref >39.00)
Triglycerides: 116 mg/dL (ref 0.0–149.0)
VLDL: 23.2 mg/dL (ref 0.0–40.0)

## 2011-09-03 LAB — POCT URINALYSIS DIPSTICK
Bilirubin, UA: NEGATIVE
Ketones, UA: NEGATIVE
Protein, UA: NEGATIVE
Spec Grav, UA: 1.015
pH, UA: 5

## 2011-09-03 LAB — BASIC METABOLIC PANEL
BUN: 17 mg/dL (ref 6–23)
GFR: 89.56 mL/min (ref >60.00)
Glucose, Bld: 91 mg/dL (ref 70–99)
Potassium: 4.4 mEq/L (ref 3.5–5.1)

## 2011-09-03 LAB — HEPATIC FUNCTION PANEL
AST: 20 U/L (ref 0–37)
Total Bilirubin: 0.7 mg/dL (ref 0.3–1.2)

## 2011-09-03 LAB — TSH: TSH: 1.24 u[IU]/mL (ref 0.35–5.50)

## 2011-09-10 ENCOUNTER — Encounter: Payer: Self-pay | Admitting: Family Medicine

## 2011-09-10 ENCOUNTER — Ambulatory Visit (INDEPENDENT_AMBULATORY_CARE_PROVIDER_SITE_OTHER): Payer: BC Managed Care – PPO | Admitting: Family Medicine

## 2011-09-10 DIAGNOSIS — H903 Sensorineural hearing loss, bilateral: Secondary | ICD-10-CM

## 2011-09-10 DIAGNOSIS — E782 Mixed hyperlipidemia: Secondary | ICD-10-CM

## 2011-09-10 DIAGNOSIS — Z0000 Encounter for general adult medical examination without abnormal findings: Secondary | ICD-10-CM

## 2011-09-10 DIAGNOSIS — D2330 Other benign neoplasm of skin of unspecified part of face: Secondary | ICD-10-CM

## 2011-09-10 DIAGNOSIS — B009 Herpesviral infection, unspecified: Secondary | ICD-10-CM

## 2011-09-10 MED ORDER — VALACYCLOVIR HCL 500 MG PO TABS: 500.0000 mg | ORAL_TABLET | Freq: Two times a day (BID) | ORAL | Status: DC

## 2011-09-10 MED ORDER — SIMVASTATIN 20 MG PO TABS: 20.0000 mg | ORAL_TABLET | Freq: Every day | ORAL | Status: DC

## 2011-09-10 NOTE — Progress Notes (Signed)
  Subjective:    Patient ID: [name], male    DOB: [birth year], 56 y.o.   MRN: [id number]  HPI [name] is a 56 year old married male nonsmoker who comes in today for general physical examination  He has 3 history of recurrent HSV which she treats with Doubtrex 500 mg twice daily when necessary  He has a history of inflammatory bowel disease currently asymptomatic followed by Dr. Dickie La  Has a history of hyperlipidemia which he treats with diet and exercise    Review of Systems  Constitutional: Negative.   HENT: Negative.   Eyes: Negative.   Respiratory: Negative.   Cardiovascular: Negative.   Gastrointestinal: Negative.   Genitourinary: Negative.   Musculoskeletal: Negative.   Skin: Negative.   Neurological: Negative.   Hematological: Negative.   Psychiatric/Behavioral: Negative.        Objective:   Physical Exam  Constitutional: He is oriented to person, place, and time. He appears well-developed and well-nourished.  HENT:  Head: Normocephalic and atraumatic.  Right Ear: External ear normal.  Left Ear: External ear normal.  Nose: Nose normal.  Mouth/Throat: Oropharynx is clear and moist.  Eyes: Conjunctivae and EOM are normal. Pupils are equal, round, and reactive to light.  Neck: Normal range of motion. Neck supple. No JVD present. No tracheal deviation present. No thyromegaly present.  Cardiovascular: Normal rate, regular rhythm, normal heart sounds and intact distal pulses.  Exam reveals no gallop and no friction rub.   No murmur heard. Pulmonary/Chest: Effort normal and breath sounds normal. No stridor. No respiratory distress. He has no wheezes. He has no rales. He exhibits no tenderness.  Abdominal: Soft. Bowel sounds are normal. He exhibits no distension and no mass. There is no tenderness. There is no rebound and no guarding.  Genitourinary: Rectum normal, prostate normal and penis normal. Guaiac negative stool. No penile tenderness.  Musculoskeletal: Normal  range of motion. He exhibits no edema and no tenderness.  Lymphadenopathy:    He has no cervical adenopathy.  Neurological: He is alert and oriented to person, place, and time. He has normal reflexes. No cranial nerve deficit. He exhibits normal muscle tone.  Skin: Skin is warm and dry. No rash noted. No erythema. No pallor.  Psychiatric: He has a normal mood and affect. His behavior is normal. Judgment and thought content normal.          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 Dickie La  History of hyperlipidemia treated with diet and exercise  Hearing loss

## 2011-09-10 NOTE — Patient Instructions (Addendum)
Continue your good health habits  Followup in 1 year sooner if any problems  Simvastatin 20 mg daily at bedtime along with an aspirin tablet  Return in 2 months for followup nonfasting labs one week prior

## 2011-11-09 ENCOUNTER — Other Ambulatory Visit (INDEPENDENT_AMBULATORY_CARE_PROVIDER_SITE_OTHER): Payer: BC Managed Care – PPO

## 2011-11-09 DIAGNOSIS — E782 Mixed hyperlipidemia: Secondary | ICD-10-CM

## 2011-11-09 LAB — HEPATIC FUNCTION PANEL
Bilirubin, Direct: 0 mg/dL (ref 0.0–0.3)
Total Bilirubin: 0.9 mg/dL (ref 0.3–1.2)

## 2011-11-09 LAB — LIPID PANEL
LDL Cholesterol: 72 mg/dL (ref 0–99)
VLDL: 10.8 mg/dL (ref 0.0–40.0)

## 2011-11-15 ENCOUNTER — Encounter: Payer: Self-pay | Admitting: Family Medicine

## 2011-11-15 ENCOUNTER — Ambulatory Visit (INDEPENDENT_AMBULATORY_CARE_PROVIDER_SITE_OTHER): Payer: BC Managed Care – PPO | Admitting: Family Medicine

## 2011-11-15 VITALS — BP 120/80 | Temp 98.3°F | Wt 176.0 lb

## 2011-11-15 DIAGNOSIS — E782 Mixed hyperlipidemia: Secondary | ICD-10-CM

## 2011-11-15 NOTE — Patient Instructions (Signed)
Continue medications daily followup in 1 year

## 2011-11-15 NOTE — Progress Notes (Signed)
  Subjective:    Patient ID: [name], male    DOB: [birth year], 56 y.o.   MRN: [id number]  HPI [name] is a 56 year old married male nonsmoker who comes in today for followup of hyperlipidemia  We did his physical couple months ago his LDL was in the 150 range. His father had coronary artery disease and bypass surgery therefore he elected to start Zocor 20 mg daily and an aspirin tablet. He comes back today for followup. No side effects from the medication. Followup lipid panel sews a 100% drop in his total cholesterol and his LDL now is in the 75 range.    Review of Systems    review of systems negative Objective:   Physical Exam Well-developed well-nourished male in no acute distress       Assessment & Plan:  Hyperlipidemia with at goal continue current therapy followup in 1 year

## 2012-01-02 ENCOUNTER — Encounter: Payer: Self-pay | Admitting: Family Medicine

## 2012-01-02 ENCOUNTER — Telehealth: Payer: Self-pay | Admitting: Family Medicine

## 2012-01-02 ENCOUNTER — Ambulatory Visit (INDEPENDENT_AMBULATORY_CARE_PROVIDER_SITE_OTHER): Payer: BC Managed Care – PPO | Admitting: Family Medicine

## 2012-01-02 VITALS — BP 104/70 | Temp 98.3°F | Wt 178.0 lb

## 2012-01-02 DIAGNOSIS — L500 Allergic urticaria: Secondary | ICD-10-CM

## 2012-01-02 MED ORDER — PREDNISONE 20 MG PO TABS: ORAL_TABLET | ORAL | Status: DC

## 2012-01-02 NOTE — Telephone Encounter (Signed)
Pt returned call and has been sch ov to see Dr Tawanna Cooler today 01/02/12 at 11:30am as noted.

## 2012-01-02 NOTE — Telephone Encounter (Signed)
Pt has rash or bug bite on neck back and in genital area pt would like to be seen today please advise

## 2012-01-02 NOTE — Progress Notes (Signed)
  Subjective:    Patient ID: [name], male    DOB: October 13, 1955, 56 y.o.   MRN: [id number]  HPI [name] is a 56 year old male who comes in with a one-day history of urticaria  He's got a history of a long-term sulfa allergy and used to break out in hives. He's not taken a sulfa recently  Yesterday he began breaking out in whelps he cannot identify a trigger   Review of Systems    general and dermatologic review of systems otherwise negative no breathing difficulty Objective:   Physical Exam  Well-developed well-nourished male in no acute distress he has a silver dollar sized whelp on his left shoulder multiple lesions on his crying and chest consistent with acute urticaria      Assessment & Plan:  Urticaria etiology unknown prednisone burst and taper

## 2012-01-02 NOTE — Patient Instructions (Signed)
Take the prednisone as directed return when necessary 

## 2012-01-02 NOTE — Telephone Encounter (Signed)
Left message on machine for patient to return our call to schedule an appointment. 

## 2012-01-15 ENCOUNTER — Other Ambulatory Visit: Payer: Self-pay | Admitting: Family Medicine

## 2012-02-09 ENCOUNTER — Other Ambulatory Visit: Payer: Self-pay | Admitting: Internal Medicine

## 2012-09-02 ENCOUNTER — Other Ambulatory Visit (INDEPENDENT_AMBULATORY_CARE_PROVIDER_SITE_OTHER): Payer: BC Managed Care – PPO

## 2012-09-02 DIAGNOSIS — Z0000 Encounter for general adult medical examination without abnormal findings: Secondary | ICD-10-CM

## 2012-09-02 LAB — LIPID PANEL
HDL: 58.8 mg/dL (ref >39.00)
LDL Cholesterol: 99 mg/dL (ref 0–99)
Total CHOL/HDL Ratio: 3
Triglycerides: 60 mg/dL (ref 0.0–149.0)
VLDL: 12 mg/dL (ref 0.0–40.0)

## 2012-09-02 LAB — HEPATIC FUNCTION PANEL
ALT: 26 U/L (ref 0–53)
AST: 20 U/L (ref 0–37)
Bilirubin, Direct: 0.1 mg/dL (ref 0.0–0.3)
Total Bilirubin: 0.8 mg/dL (ref 0.3–1.2)
Total Protein: 7 g/dL (ref 6.0–8.3)

## 2012-09-02 LAB — POCT URINALYSIS DIPSTICK
Bilirubin, UA: NEGATIVE
Blood, UA: NEGATIVE
Glucose, UA: NEGATIVE
Spec Grav, UA: 1.02
pH, UA: 6

## 2012-09-02 LAB — BASIC METABOLIC PANEL
BUN: 24 mg/dL — ABNORMAL HIGH (ref 6–23)
CO2: 30 mEq/L (ref 19–32)
Chloride: 106 mEq/L (ref 96–112)
Glucose, Bld: 105 mg/dL — ABNORMAL HIGH (ref 70–99)
Potassium: 5 mEq/L (ref 3.5–5.1)

## 2012-09-02 LAB — CBC WITH DIFFERENTIAL/PLATELET
Eosinophils Relative: 3 % (ref 0.0–5.0)
HCT: 43.7 % (ref 39.0–52.0)
Lymphs Abs: 1.5 10*3/uL (ref 0.7–4.0)
MCV: 93.5 fl (ref 78.0–100.0)
Monocytes Absolute: 0.6 10*3/uL (ref 0.1–1.0)
Platelets: 255 10*3/uL (ref 150.0–400.0)
RDW: 12.8 % (ref 11.5–14.6)

## 2012-09-02 LAB — PSA: PSA: 4.44 ng/mL — ABNORMAL HIGH (ref 0.10–4.00)

## 2012-09-10 ENCOUNTER — Encounter: Payer: BC Managed Care – PPO | Admitting: Family Medicine

## 2012-09-16 ENCOUNTER — Other Ambulatory Visit: Payer: Self-pay | Admitting: Internal Medicine

## 2012-09-24 ENCOUNTER — Other Ambulatory Visit: Payer: Self-pay | Admitting: Internal Medicine

## 2012-10-01 ENCOUNTER — Telehealth: Payer: Self-pay | Admitting: Internal Medicine

## 2012-10-01 MED ORDER — BALSALAZIDE DISODIUM 750 MG PO CAPS: ORAL_CAPSULE | ORAL | Status: DC

## 2012-10-01 NOTE — Telephone Encounter (Signed)
Rx for colozal sent to patient's pharmacy until appt on 11/13/12.

## 2012-10-21 ENCOUNTER — Ambulatory Visit (INDEPENDENT_AMBULATORY_CARE_PROVIDER_SITE_OTHER): Payer: BC Managed Care – PPO | Admitting: Family Medicine

## 2012-10-21 ENCOUNTER — Encounter: Payer: Self-pay | Admitting: Family Medicine

## 2012-10-21 VITALS — BP 120/80 | Temp 98.2°F | Ht 66.5 in | Wt 186.0 lb

## 2012-10-21 DIAGNOSIS — H903 Sensorineural hearing loss, bilateral: Secondary | ICD-10-CM

## 2012-10-21 DIAGNOSIS — B009 Herpesviral infection, unspecified: Secondary | ICD-10-CM

## 2012-10-21 DIAGNOSIS — G4733 Obstructive sleep apnea (adult) (pediatric): Secondary | ICD-10-CM

## 2012-10-21 DIAGNOSIS — E782 Mixed hyperlipidemia: Secondary | ICD-10-CM

## 2012-10-21 DIAGNOSIS — R972 Elevated prostate specific antigen [PSA]: Secondary | ICD-10-CM

## 2012-10-21 MED ORDER — VALACYCLOVIR HCL 500 MG PO TABS: ORAL_TABLET | ORAL | Status: DC

## 2012-10-21 MED ORDER — SIMVASTATIN 20 MG PO TABS: 20.0000 mg | ORAL_TABLET | Freq: Every day | ORAL | Status: DC

## 2012-10-21 NOTE — Patient Instructions (Signed)
Continue current medications  Followup in 1 year sooner if any problem 

## 2012-10-21 NOTE — Progress Notes (Signed)
  Subjective:    Patient ID: [name], male    DOB: 02/15/1956, 57 y.o.   MRN: [id number]  HPI [name] is a 57 year old married male nonsmoker who comes in today for general physical examination  He takes acyclovir when necessary for outbreaks of HSV one  He takes Zocor 20 mg daily for hyperlipidemia. He sees Dr. Dickie La on a regular basis because he has a history of ulcerative colitis currently asymptomatic  He also had an evaluation in pulmonary by Dr. Mellody Dance. He's on a sleep apnea protocol and machine  He gets routine eye care, dental care, GI followup as outlined, tetanus 2008 seasonal flu shot 2013  He states he feels well and has no complaints.  He has 3 children by his first days dull in the process of graduating from college one is graduating from law school. 4 stepdaughters all her in school also. His wife is a Engineer, civil (consulting),,,,,,,, teaches labor and delivery   Review of Systems  Constitutional: Negative.   HENT: Negative.   Eyes: Negative.   Respiratory: Negative.   Cardiovascular: Negative.   Gastrointestinal: Negative.   Genitourinary: Negative.   Musculoskeletal: Negative.   Skin: Negative.   Neurological: Negative.   Psychiatric/Behavioral: Negative.        Objective:   Physical Exam  Constitutional: He is oriented to person, place, and time. He appears well-developed and well-nourished.  HENT:  Head: Normocephalic and atraumatic.  Right Ear: External ear normal.  Left Ear: External ear normal.  Nose: Nose normal.  Mouth/Throat: Oropharynx is clear and moist.  Bilateral hearing aids  Eyes: Conjunctivae and EOM are normal. Pupils are equal, round, and reactive to light.  Neck: Normal range of motion. Neck supple. No JVD present. No tracheal deviation present. No thyromegaly present.  Cardiovascular: Normal rate, regular rhythm, normal heart sounds and intact distal pulses.  Exam reveals no gallop and no friction rub.   No murmur heard. Pulmonary/Chest: Effort normal  and breath sounds normal. No stridor. No respiratory distress. He has no wheezes. He has no rales. He exhibits no tenderness.  Abdominal: Soft. Bowel sounds are normal. He exhibits no distension and no mass. There is no tenderness. There is no rebound and no guarding.  Genitourinary: Rectum normal, prostate normal and penis normal. Guaiac negative stool. No penile tenderness.  Musculoskeletal: Normal range of motion. He exhibits no edema and no tenderness.  Lymphadenopathy:    He has no cervical adenopathy.  Neurological: He is alert and oriented to person, place, and time. He has normal reflexes. No cranial nerve deficit. He exhibits normal muscle tone.  Skin: Skin is warm and dry. No rash noted. No erythema. No pallor.  Total body skin exam normal  Psychiatric: He has a normal mood and affect. His behavior is normal. Judgment and thought content normal.          Assessment & Plan:  Healthy male  Hyperlipidemia continue Zocor 20 mg daily  History of HSV one Valtrex when necessary  History of colitis followup by Dr. Dickie La  History of sleep apnea followup by Dr. Mellody Dance and pulmonary  Elevated PSA with normal prostate exam followup PSA in 4 months

## 2012-11-13 ENCOUNTER — Ambulatory Visit (INDEPENDENT_AMBULATORY_CARE_PROVIDER_SITE_OTHER): Payer: BC Managed Care – PPO | Admitting: Internal Medicine

## 2012-11-13 ENCOUNTER — Encounter: Payer: Self-pay | Admitting: Internal Medicine

## 2012-11-13 VITALS — BP 100/70 | HR 72 | Ht 65.75 in | Wt 181.2 lb

## 2012-11-13 DIAGNOSIS — K5100 Ulcerative (chronic) pancolitis without complications: Secondary | ICD-10-CM

## 2012-11-13 MED ORDER — BALSALAZIDE DISODIUM 750 MG PO CAPS: ORAL_CAPSULE | ORAL | Status: DC

## 2012-11-13 NOTE — Progress Notes (Signed)
[name] 08/12/1955 MRN [id number]  History of Present Illness:  This is a 57 year old white male with ulcerative colitis diagnosed in 2002 with a flareup in 2006. She has been in remission for up to 7 years. His last colonoscopy in October 2011 was normal. He has reduced his Colazal to 750 mg twice a day. He denies abdominal pain, rectal bleeding or diarrhea. His bowel movements are formed, usually 2-3 times a day. He had a complete physical exam by Dr. Tawanna Cooler 2 months ago and had a normal hemoglobin of 14.8 with a hematocrit 43.7 and normal liver function tests. His PSA was elevated.   Past Medical History  Diagnosis Date  . Anxiety   . Neoplasm of face     benign  . Sleep apnea   . Hyperlipidemia   . Ulcerative colitis   . Allergy    Past Surgical History  Procedure Laterality Date  . Hernia repair    . Amputation      right fourth toe  . Shoulder arthrotomy      reports that he has quit smoking. He has never used smokeless tobacco. He reports that  drinks alcohol. He reports that he does not use illicit drugs. family history includes Breast cancer in an unspecified family member; Colon cancer in an unspecified family member; Diabetes in an unspecified family member; Heart attack in his father; Hyperlipidemia in an unspecified family member; Lung cancer in an unspecified family member; and Thyroid disease in an unspecified family member. Allergies  Allergen Reactions  . Sulfamethoxazole     REACTION: unspecified        Review of Systems: Denies heartburn chest pain weight loss  The remainder of the 10 point ROS is negative except as outlined in H&P   Physical Exam: General appearance  Well developed, in no distress. Eyes- non icteric. HEENT nontraumatic, normocephalic. Mouth no lesions, tongue papillated, no cheilosis. Neck supple without adenopathy, thyroid not enlarged, no carotid bruits, no JVD. Lungs Clear to auscultation bilaterally. Cor normal S1, normal S2,  regular rhythm, no murmur,  quiet precordium. Abdomen: Soft and nontender with normoactive bowel sounds. Liver edge at costal margin. No distention. Rectal: small amount of soft Hemoccult-negative stool. Extremities no pedal edema. Skin no lesions. Neurological alert and oriented x 3. Psychological normal mood and affect.  Assessment and Plan:  Problem #74 57 year old white male with ulcerative pancolitis initially diagnosed in 2002 with a flareup in 2006. He is now in clinical remission on a small dose of Colazal. We have discussed stopping Colazal completely but he has some hesitation about it. He will decrease his Colazal to one a day for the next 6 months and then discontinue. He will be due for a repeat colonoscopy in October 2016. He is up-to-date on his labs.   11/13/2012 Lina Sar

## 2012-11-13 NOTE — Patient Instructions (Addendum)
We have sent the following medications to your pharmacy for you to pick up at your convenience: Colazal  Please follow up with Dr [name] in 1 year.  CC: Dr Kelle Darting

## 2013-02-23 ENCOUNTER — Ambulatory Visit (INDEPENDENT_AMBULATORY_CARE_PROVIDER_SITE_OTHER): Payer: BC Managed Care – PPO | Admitting: Internal Medicine

## 2013-02-23 ENCOUNTER — Encounter: Payer: Self-pay | Admitting: Internal Medicine

## 2013-02-23 ENCOUNTER — Ambulatory Visit: Payer: BC Managed Care – PPO | Admitting: Family Medicine

## 2013-02-23 VITALS — BP 132/80 | HR 72 | Temp 98.6°F | Resp 20 | Wt 180.0 lb

## 2013-02-23 DIAGNOSIS — K5190 Ulcerative colitis, unspecified, without complications: Secondary | ICD-10-CM

## 2013-02-23 DIAGNOSIS — R972 Elevated prostate specific antigen [PSA]: Secondary | ICD-10-CM

## 2013-02-23 LAB — PSA: PSA: 2.86 ng/mL (ref 0.10–4.00)

## 2013-02-23 NOTE — Patient Instructions (Addendum)
Annual exam with Dr. Tawanna Cooler in April

## 2013-02-23 NOTE — Progress Notes (Signed)
Subjective:    Patient ID: [name], male    DOB: 1956/03/29, 57 y.o.   MRN: [id number]  HPI  57 year old patient who has a history of OSA UC who is seen today for followup of an elevated PSA. He was seen for a health maintenance examination several months ago with a normal prostate exam. Denies any symptoms of prostatism. No new concerns or complaints. He states his UC has been quite stable.  Past Medical History  Diagnosis Date  . Anxiety   . Neoplasm of face     benign  . Sleep apnea   . Hyperlipidemia   . Ulcerative colitis   . Allergy     History   Social History  . Marital Status: Married    Spouse Name: N/A    Number of Children: 3  . Years of Education: N/A   Occupational History  . Flooring Contractor    Social History Main Topics  . Smoking status: Former Games developer  . Smokeless tobacco: Never Used  . Alcohol Use: Yes  . Drug Use: No  . Sexual Activity: Not on file   Other Topics Concern  . Not on file   Social History Narrative  . No narrative on file    Past Surgical History  Procedure Laterality Date  . Hernia repair    . Amputation      right fourth toe  . Shoulder arthrotomy      Family History  Problem Relation Age of Onset  . Heart attack Father   . Hyperlipidemia    . Diabetes    . Thyroid disease    . Lung cancer    . Breast cancer    . Colon cancer      Allergies  Allergen Reactions  . Sulfamethoxazole     REACTION: unspecified    Current Outpatient Prescriptions on File Prior to Visit  Medication Sig Dispense Refill  . fish oil-omega-3 fatty acids 1000 MG capsule Take 2 g by mouth daily.        Marland Kitchen glucosamine-chondroitin 500-400 MG tablet Take 1 tablet by mouth 3 (three) times daily.        . Multiple Vitamin (MULTIVITAMIN) tablet Take 1 tablet by mouth daily.        . simvastatin (ZOCOR) 20 MG tablet Take 1 tablet (20 mg total) by mouth at bedtime.  100 tablet  3   No current facility-administered medications on file  prior to visit.    BP 132/80  Pulse 72  Temp(Src) 98.6 F (37 C) (Oral)  Resp 20  Wt 180 lb (81.647 kg)  BMI 29.28 kg/m2  SpO2 96%      Review of Systems  Constitutional: Negative for fever, chills, appetite change and fatigue.  HENT: Negative for hearing loss, ear pain, congestion, sore throat, trouble swallowing, neck stiffness, dental problem, voice change and tinnitus.   Eyes: Negative for pain, discharge and visual disturbance.  Respiratory: Negative for cough, chest tightness, wheezing and stridor.   Cardiovascular: Negative for chest pain, palpitations and leg swelling.  Gastrointestinal: Negative for nausea, vomiting, abdominal pain, diarrhea, constipation, blood in stool and abdominal distention.  Genitourinary: Negative for urgency, hematuria, flank pain, discharge, difficulty urinating and genital sores.  Musculoskeletal: Negative for myalgias, back pain, joint swelling, arthralgias and gait problem.  Skin: Negative for rash.  Neurological: Negative for dizziness, syncope, speech difficulty, weakness, numbness and headaches.  Hematological: Negative for adenopathy. Does not bruise/bleed easily.  Psychiatric/Behavioral: Negative for behavioral problems and  dysphoric mood. The patient is not nervous/anxious.        Objective:   Physical Exam  Constitutional: He is oriented to person, place, and time. He appears well-developed.  HENT:  Head: Normocephalic.  Right Ear: External ear normal.  Left Ear: External ear normal.  Eyes: Conjunctivae and EOM are normal.  Neck: Normal range of motion.  Cardiovascular: Normal rate and normal heart sounds.   Pulmonary/Chest: Breath sounds normal.  Abdominal: Bowel sounds are normal.  Musculoskeletal: Normal range of motion. He exhibits no edema and no tenderness.  Neurological: He is alert and oriented to person, place, and time.  Psychiatric: He has a normal mood and affect. His behavior is normal.          Assessment  & Plan:   History of elevated PSA. We'll recheck the patient's PSA. If this is back to baseline will recheck at the time of his next physical in 8 months. If PSA is still elevated we'll set up for urological evaluation Ulcerative colitis stable

## 2013-03-23 ENCOUNTER — Ambulatory Visit (INDEPENDENT_AMBULATORY_CARE_PROVIDER_SITE_OTHER): Payer: BC Managed Care – PPO | Admitting: Family Medicine

## 2013-03-23 ENCOUNTER — Encounter: Payer: Self-pay | Admitting: Family Medicine

## 2013-03-23 VITALS — BP 140/90 | Temp 98.2°F | Wt 180.0 lb

## 2013-03-23 DIAGNOSIS — K130 Diseases of lips: Secondary | ICD-10-CM

## 2013-03-23 DIAGNOSIS — J069 Acute upper respiratory infection, unspecified: Secondary | ICD-10-CM

## 2013-03-23 DIAGNOSIS — Z23 Encounter for immunization: Secondary | ICD-10-CM

## 2013-03-23 MED ORDER — NYSTATIN 100000 UNIT/GM EX OINT: TOPICAL_OINTMENT | Freq: Two times a day (BID) | CUTANEOUS | Status: DC

## 2013-03-23 NOTE — Patient Instructions (Addendum)
On lips: -use nystatin ointment as instructed -use hydrocortisone cream twice daily -follow up with your doctor in not resolved in 2 [place] or if worsnening  INSTRUCTIONS FOR UPPER RESPIRATORY INFECTION:  -plenty of rest and fluids  -nasal saline wash 2-3 times daily (use prepackaged nasal saline or bottled/distilled water if making your own)   -can use sinex or afrin nasal spray for drainage and nasal congestion - but do NOT use longer then 3-4 days  -can use tylenol or ibuprofen as directed for aches and sorethroat  -if you are taking a cough medication - use only as directed, may also try a teaspoon of honey to coat the throat and throat lozenges  -for sore throat, salt water gargles can help  -follow up if you have fevers, facial pain, tooth pain, difficulty breathing or are worsening or not getting better in 5-7 days

## 2013-03-23 NOTE — Progress Notes (Signed)
Chief Complaint  Patient presents with  . sores on mouth  . Sinus Problem    HPI:   [name], a 57 yo pt of Dr. Tawanna Cooler with UC, presents for an acute visit for:  1) dry, cracked lips: -on and off for a year -uses nystatin cream that dentist prescribes that clears it up - but he thinks this is expired and he left it in the hot car and it is not working as well -thought from moisture from cpap at night  2) Sinus Issues: -started 2 days ago -symptoms: nasal congestion, cough -denies fevers, chills, ear pain, sinus pain tooth pain  ROS: See pertinent positives and negatives per HPI.  Past Medical History  Diagnosis Date  . Anxiety   . Neoplasm of face     benign  . Sleep apnea   . Hyperlipidemia   . Ulcerative colitis   . Allergy     Past Surgical History  Procedure Laterality Date  . Hernia repair    . Amputation      right fourth toe  . Shoulder arthrotomy      Family History  Problem Relation Age of Onset  . Heart attack Father   . Hyperlipidemia    . Diabetes    . Thyroid disease    . Lung cancer    . Breast cancer    . Colon cancer      History   Social History  . Marital Status: Married    Spouse Name: N/A    Number of Children: 3  . Years of Education: N/A   Occupational History  . Flooring Contractor    Social History Main Topics  . Smoking status: Former Games developer  . Smokeless tobacco: Never Used  . Alcohol Use: Yes  . Drug Use: No  . Sexual Activity: None   Other Topics Concern  . None   Social History Narrative  . None    Current outpatient prescriptions:balsalazide (COLAZAL) 750 MG capsule, Take 750 mg by mouth daily., Disp: , Rfl: ;  fish oil-omega-3 fatty acids 1000 MG capsule, Take 2 g by mouth daily.  , Disp: , Rfl: ;  glucosamine-chondroitin 500-400 MG tablet, Take 1 tablet by mouth 3 (three) times daily.  , Disp: , Rfl: ;  Multiple Vitamin (MULTIVITAMIN) tablet, Take 1 tablet by mouth daily.  , Disp: , Rfl:  simvastatin  (ZOCOR) 20 MG tablet, Take 1 tablet (20 mg total) by mouth at bedtime., Disp: 100 tablet, Rfl: 3;  valACYclovir (VALTREX) 500 MG tablet, TAKE 1 TABLET BY MOUTH TWICE DAILY PRN, Disp: , Rfl: ;  nystatin ointment (MYCOSTATIN), Apply topically 2 (two) times daily., Disp: 30 g, Rfl: 0  EXAM:  Filed Vitals:   03/23/13 0811  BP: 140/90  Temp: 98.2 F (36.8 C)    Body mass index is 29.28 kg/(m^2).  GENERAL: vitals reviewed and listed above, alert, oriented, appears well hydrated and in no acute distress  HEENT: atraumatic, conjunttiva clear, no obvious abnormalities on inspection of external nose and ears, dry cracked lips with some redness in corners of lips, normal appearance of ear canals and TMs, clear nasal congestion, mild post oropharyngeal erythema with PND, no tonsillar edema or exudate, no sinus TTP  NECK: no obvious masses on inspection  LUNGS: clear to auscultation bilaterally, no wheezes, rales or rhonchi, good air movement  CV: HRRR, no peripheral edema  MS: moves all extremities without noticeable abnormality  PSYCH: pleasant and cooperative, no obvious depression  or anxiety  ASSESSMENT AND PLAN:  Discussed the following assessment and plan:  Cracked lips - Plan: nystatin ointment (MYCOSTATIN)  Upper respiratory infection  -lips likely yeast given hx cleared with nystatin - refilled and return precautions -upper resp symptoms c/w mild VURI - recs per instructions and return precautions -diastolic BP mildly up, he rushed in here, follow with PCP -Patient advised to return or notify a doctor immediately if symptoms worsen or persist or new concerns arise.  Patient Instructions  On lips: -use nystatin ointment as instructed -use hydrocortisone cream twice daily -follow up with your doctor in not resolved in 2 [place] or if worsnening  INSTRUCTIONS FOR UPPER RESPIRATORY INFECTION:  -plenty of rest and fluids  -nasal saline wash 2-3 times daily (use prepackaged  nasal saline or bottled/distilled water if making your own)   -can use sinex or afrin nasal spray for drainage and nasal congestion - but do NOT use longer then 3-4 days  -can use tylenol or ibuprofen as directed for aches and sorethroat  -if you are taking a cough medication - use only as directed, may also try a teaspoon of honey to coat the throat and throat lozenges  -for sore throat, salt water gargles can help  -follow up if you have fevers, facial pain, tooth pain, difficulty breathing or are worsening or not getting better in 5-7 days      Jodean Valade R.

## 2013-03-24 ENCOUNTER — Other Ambulatory Visit: Payer: Self-pay | Admitting: Family Medicine

## 2013-05-11 ENCOUNTER — Telehealth: Payer: Self-pay | Admitting: Family Medicine

## 2013-05-11 NOTE — Telephone Encounter (Signed)
Pt states his ear has discomfort. Pt wears a hearing aid and the entire area is tender. Pt has had discomfort  for a couple of days. No appt today and only SD tomorrow. pls advise.

## 2013-05-11 NOTE — Telephone Encounter (Signed)
appt scheduled

## 2013-05-11 NOTE — Telephone Encounter (Signed)
Okay to use a SD tomorrow

## 2013-05-12 ENCOUNTER — Encounter: Payer: Self-pay | Admitting: Family Medicine

## 2013-05-12 ENCOUNTER — Ambulatory Visit (INDEPENDENT_AMBULATORY_CARE_PROVIDER_SITE_OTHER): Payer: BC Managed Care – PPO | Admitting: Family Medicine

## 2013-05-12 VITALS — BP 120/80 | Temp 98.2°F | Wt 188.0 lb

## 2013-05-12 DIAGNOSIS — H6000 Abscess of external ear, unspecified ear: Secondary | ICD-10-CM | POA: Insufficient documentation

## 2013-05-12 DIAGNOSIS — L0202 Furuncle of face: Secondary | ICD-10-CM

## 2013-05-12 MED ORDER — CEPHALEXIN 500 MG PO CAPS: ORAL_CAPSULE | ORAL | Status: DC

## 2013-05-12 NOTE — Progress Notes (Signed)
  Subjective:    Patient ID: [name], male    DOB: [birth year], 57 y.o.   MRN: [id number]  HPI [name] is a 57 year old married male nonsmoker who comes in today for evaluation of pain in his right ear canal  He wears hearing aids and has noticed soreness and redness in his right ear canal. His wife looked at his years and thinks that she can see a boil.   Review of Systems    review of systems negative Objective:   Physical Exam  Well-developed well-nourished male no acute distress examination the right ear shows the pinna to be normal. At the 3:00 position 1/2 inch into the ear canal there is a dime-sized boil      Assessment & Plan:  Boil right ear canal plan Keflex warm soaks return when necessary

## 2013-05-12 NOTE — Patient Instructions (Signed)
Keflex 500 mg,,,,,,, 2 tabs twice daily for 10 days  Return when necessary

## 2013-08-20 ENCOUNTER — Ambulatory Visit (INDEPENDENT_AMBULATORY_CARE_PROVIDER_SITE_OTHER): Payer: BC Managed Care – PPO | Admitting: Family Medicine

## 2013-08-20 ENCOUNTER — Encounter: Payer: Self-pay | Admitting: Family Medicine

## 2013-08-20 VITALS — BP 120/80 | Temp 97.8°F | Wt 188.0 lb

## 2013-08-20 DIAGNOSIS — B369 Superficial mycosis, unspecified: Secondary | ICD-10-CM

## 2013-08-20 NOTE — Patient Instructions (Signed)
A supplies small amounts of Castro twice daily

## 2013-08-20 NOTE — Progress Notes (Signed)
   Subjective:    Patient ID: [name], male    DOB: August 21, 1955, 58 y.o.   MRN: [id number]  HPI [name] is a 58 year old male who comes in today for evaluation of rash on the corners of each lip  He wears CPAP because of a history of sleep apnea and has chronic irritation. He was seen by another physician who gave him nystatin ointment but didn't do any good   Review of Systems    review of systems negative Objective:   Physical Exam Well-developed and nourished male no acute distress vital signs stable he said Neoma Laming examination oral cavity shows a rash consistent with kilos this       Assessment & Plan:  Fungal infection........ Freddrick March

## 2013-08-20 NOTE — Progress Notes (Signed)
Pre visit review using our clinic review tool, if applicable. No additional management support is needed unless otherwise documented below in the visit note. 

## 2013-10-15 ENCOUNTER — Other Ambulatory Visit (INDEPENDENT_AMBULATORY_CARE_PROVIDER_SITE_OTHER): Payer: BC Managed Care – PPO

## 2013-10-15 DIAGNOSIS — Z0000 Encounter for general adult medical examination without abnormal findings: Secondary | ICD-10-CM

## 2013-10-15 LAB — BASIC METABOLIC PANEL
BUN: 23 mg/dL (ref 6–23)
CO2: 29 mEq/L (ref 19–32)
Calcium: 9.5 mg/dL (ref 8.4–10.5)
Chloride: 105 mEq/L (ref 96–112)
Creatinine, Ser: 1.1 mg/dL (ref 0.4–1.5)
GFR: 77.26 mL/min (ref >60.00)
Glucose, Bld: 95 mg/dL (ref 70–99)
POTASSIUM: 4.9 meq/L (ref 3.5–5.1)
Sodium: 141 mEq/L (ref 135–145)

## 2013-10-15 LAB — CBC WITH DIFFERENTIAL/PLATELET
BASOS PCT: 1.3 % (ref 0.0–3.0)
Basophils Absolute: 0.1 10*3/uL (ref 0.0–0.1)
EOS PCT: 3.4 % (ref 0.0–5.0)
Eosinophils Absolute: 0.2 10*3/uL (ref 0.0–0.7)
HEMATOCRIT: 44.3 % (ref 39.0–52.0)
Hemoglobin: 15.1 g/dL (ref 13.0–17.0)
LYMPHS ABS: 1.4 10*3/uL (ref 0.7–4.0)
Lymphocytes Relative: 24.9 % (ref 12.0–46.0)
MCHC: 33.9 g/dL (ref 30.0–36.0)
MCV: 92.6 fl (ref 78.0–100.0)
MONOS PCT: 10.4 % (ref 3.0–12.0)
Monocytes Absolute: 0.6 10*3/uL (ref 0.1–1.0)
Neutro Abs: 3.3 10*3/uL (ref 1.4–7.7)
Neutrophils Relative %: 60 % (ref 43.0–77.0)
PLATELETS: 265 10*3/uL (ref 150.0–400.0)
RBC: 4.79 Mil/uL (ref 4.22–5.81)
RDW: 13.1 % (ref 11.5–14.6)
WBC: 5.5 10*3/uL (ref 4.5–10.5)

## 2013-10-15 LAB — LIPID PANEL
CHOL/HDL RATIO: 3
Cholesterol: 172 mg/dL (ref 0–200)
HDL: 57.4 mg/dL (ref >39.00)
LDL Cholesterol: 105 mg/dL — ABNORMAL HIGH (ref 0–99)
TRIGLYCERIDES: 46 mg/dL (ref 0.0–149.0)
VLDL: 9.2 mg/dL (ref 0.0–40.0)

## 2013-10-15 LAB — PSA: PSA: 6.01 ng/mL — ABNORMAL HIGH (ref 0.10–4.00)

## 2013-10-15 LAB — TSH: TSH: 1.68 u[IU]/mL (ref 0.35–5.50)

## 2013-10-15 LAB — HEPATIC FUNCTION PANEL
ALT: 20 U/L (ref 0–53)
AST: 18 U/L (ref 0–37)
Albumin: 4.3 g/dL (ref 3.5–5.2)
Alkaline Phosphatase: 41 U/L (ref 39–117)
BILIRUBIN TOTAL: 0.8 mg/dL (ref 0.3–1.2)
Bilirubin, Direct: 0.1 mg/dL (ref 0.0–0.3)
Total Protein: 6.9 g/dL (ref 6.0–8.3)

## 2013-10-15 LAB — POCT URINALYSIS DIPSTICK
BILIRUBIN UA: NEGATIVE
Blood, UA: NEGATIVE
GLUCOSE UA: NEGATIVE
Ketones, UA: NEGATIVE
Leukocytes, UA: NEGATIVE
Nitrite, UA: NEGATIVE
Spec Grav, UA: 1.02
Urobilinogen, UA: 0.2
pH, UA: 7

## 2013-10-20 ENCOUNTER — Other Ambulatory Visit: Payer: BC Managed Care – PPO

## 2013-10-22 ENCOUNTER — Ambulatory Visit (INDEPENDENT_AMBULATORY_CARE_PROVIDER_SITE_OTHER): Payer: BC Managed Care – PPO | Admitting: Family Medicine

## 2013-10-22 ENCOUNTER — Encounter: Payer: Self-pay | Admitting: Family Medicine

## 2013-10-22 VITALS — BP 120/80 | Temp 98.1°F | Ht 66.5 in | Wt 184.0 lb

## 2013-10-22 DIAGNOSIS — K5190 Ulcerative colitis, unspecified, without complications: Secondary | ICD-10-CM

## 2013-10-22 DIAGNOSIS — R972 Elevated prostate specific antigen [PSA]: Secondary | ICD-10-CM

## 2013-10-22 DIAGNOSIS — B009 Herpesviral infection, unspecified: Secondary | ICD-10-CM

## 2013-10-22 DIAGNOSIS — E782 Mixed hyperlipidemia: Secondary | ICD-10-CM

## 2013-10-22 DIAGNOSIS — G4733 Obstructive sleep apnea (adult) (pediatric): Secondary | ICD-10-CM

## 2013-10-22 MED ORDER — SIMVASTATIN 20 MG PO TABS: 20.0000 mg | ORAL_TABLET | Freq: Every day | ORAL | Status: DC

## 2013-10-22 MED ORDER — VALACYCLOVIR HCL 500 MG PO TABS: 500.0000 mg | ORAL_TABLET | Freq: Two times a day (BID) | ORAL | Status: DC

## 2013-10-22 NOTE — Progress Notes (Signed)
Pre visit review using our clinic review tool, if applicable. No additional management support is needed unless otherwise documented below in the visit note. 

## 2013-10-22 NOTE — Progress Notes (Signed)
   Subjective:    Patient ID: [name], male    DOB: Nov 07, 1955, 58 y.o.   MRN: [id number]  HPI [name] is a 58 year old married male nonsmoker who comes in today for general physical examination  His history of ulcer colitis and is followed by Dr. Maurene Capes. He's on medication oral  He takes simvastatin without an aspirin daily for hyperlipidemia  He also takes acyclovir 500 twice a day when necessary when he has an outbreak of HSV one  He does not get routine eye care does get regular dental care.: Followup by Dr. Maurene Capes vaccinations up-to-date   Review of Systems  Constitutional: Negative.   HENT: Negative.   Eyes: Negative.   Respiratory: Negative.   Cardiovascular: Negative.   Gastrointestinal: Negative.   Genitourinary: Negative.   Musculoskeletal: Negative.   Skin: Negative.   Neurological: Negative.   Psychiatric/Behavioral: Negative.        Objective:   Physical Exam  Nursing note and vitals reviewed. Constitutional: He is oriented to person, place, and time. He appears well-developed and well-nourished.  HENT:  Head: Normocephalic and atraumatic.  Right Ear: External ear normal.  Left Ear: External ear normal.  Nose: Nose normal.  Mouth/Throat: Oropharynx is clear and moist.  Eyes: Conjunctivae and EOM are normal. Pupils are equal, round, and reactive to light.  Neck: Normal range of motion. Neck supple. No JVD present. No tracheal deviation present. No thyromegaly present.  Cardiovascular: Normal rate, regular rhythm, normal heart sounds and intact distal pulses.  Exam reveals no gallop and no friction rub.   No murmur heard. No carotid nor aortic bruits peripheral pulses 2+ and symmetrical  Pulmonary/Chest: Effort normal and breath sounds normal. No stridor. No respiratory distress. He has no wheezes. He has no rales. He exhibits no tenderness.  Abdominal: Soft. Bowel sounds are normal. He exhibits no distension and no mass. There is no tenderness. There is no  rebound and no guarding.  Genitourinary: Rectum normal, prostate normal and penis normal. Guaiac negative stool. No penile tenderness.  Musculoskeletal: Normal range of motion. He exhibits no edema and no tenderness.  Slight increase in size of right knee Scott degenerative cartilage in that knee from years of hard work  Lymphadenopathy:    He has no cervical adenopathy.  Neurological: He is alert and oriented to person, place, and time. He has normal reflexes. No cranial nerve deficit. He exhibits normal muscle tone.  Skin: Skin is warm and dry. No rash noted. No erythema. No pallor.  Total body skin exam normal  Psychiatric: He has a normal mood and affect. His behavior is normal. Judgment and thought content normal.          Assessment & Plan:  Healthy male  Hyperlipidemia continue simvastatin and aspirin  Ulcerative colitis followed by Dr. Maurene Capes  Recurrent HSV 1 acyclovir.

## 2013-10-22 NOTE — Patient Instructions (Addendum)
Continue the acyclovir 500 mg twice daily when necessary  Simvastatin 20 mg daily,,,,,,, at an aspirin tablet  Followup in 1 year sooner if any problems  Dr. Rodena Goldmann

## 2013-10-26 ENCOUNTER — Encounter: Payer: BC Managed Care – PPO | Admitting: Family Medicine

## 2013-11-13 ENCOUNTER — Other Ambulatory Visit: Payer: Self-pay | Admitting: Family Medicine

## 2013-11-13 ENCOUNTER — Other Ambulatory Visit: Payer: Self-pay | Admitting: Internal Medicine

## 2013-11-17 ENCOUNTER — Telehealth: Payer: Self-pay | Admitting: Internal Medicine

## 2013-11-17 MED ORDER — BALSALAZIDE DISODIUM 750 MG PO CAPS: 750.0000 mg | ORAL_CAPSULE | Freq: Every day | ORAL | Status: DC

## 2013-11-17 NOTE — Telephone Encounter (Signed)
Rx sent. Patient notified. 

## 2013-11-17 NOTE — Telephone Encounter (Signed)
Called patient to see if he is having any problems with UC since per last OV note on 11/13/12, he was to go to Colazal day for 6 months then stop. He had requested a refill on Colazal. Patient states he never stopped the daily Colazal. He is doing fine. He has scheduled an OV in July. Is it ok to refill?

## 2013-11-17 NOTE — Telephone Encounter (Signed)
OK to refill Colazal till he sees me.

## 2013-11-23 ENCOUNTER — Other Ambulatory Visit (INDEPENDENT_AMBULATORY_CARE_PROVIDER_SITE_OTHER): Payer: BC Managed Care – PPO

## 2013-11-23 DIAGNOSIS — R972 Elevated prostate specific antigen [PSA]: Secondary | ICD-10-CM

## 2013-11-23 LAB — PSA: PSA: 10.57 ng/mL — AB (ref 0.10–4.00)

## 2014-01-07 ENCOUNTER — Ambulatory Visit (INDEPENDENT_AMBULATORY_CARE_PROVIDER_SITE_OTHER): Payer: BC Managed Care – PPO | Admitting: Internal Medicine

## 2014-01-07 ENCOUNTER — Encounter: Payer: Self-pay | Admitting: Internal Medicine

## 2014-01-07 VITALS — BP 148/90 | HR 68 | Ht 65.5 in | Wt 181.1 lb

## 2014-01-07 DIAGNOSIS — K5100 Ulcerative (chronic) pancolitis without complications: Secondary | ICD-10-CM

## 2014-01-07 MED ORDER — BALSALAZIDE DISODIUM 750 MG PO CAPS: 750.0000 mg | ORAL_CAPSULE | Freq: Two times a day (BID) | ORAL | Status: DC

## 2014-01-07 NOTE — Patient Instructions (Addendum)
We have sent the following medications to your pharmacy for you to pick up at your convenience: Colazal for a 90 day supply.   Follow up with Dr. Olevia Perches in one year., Dr Sherren Mocha

## 2014-01-07 NOTE — Progress Notes (Signed)
[name] Dec 21, 1955 [id number]  Note: This dictation was prepared with Dragon digital system. Any transcriptional errors that result from this procedure are unintentional.   History of Present Illness: This is a 58 year old white male with history of ulcerative colitis since 2002. Last flareup was in 2006. Last colonoscopy in October 2011 was normal. He was on Colazal 750 mg twice a day but has been able to reduce the dose to 1 a day. Last OV was in May . 2014. He has no complaints today. He denies diarrhea, rectal bleeding or abdominal pain. He was just diagnosed with prostate cancer and will be likely undergoing a radical prostatectomy. , PSA > 10.    Past Medical History  Diagnosis Date  . Anxiety   . Neoplasm of face     benign  . Sleep apnea   . Hyperlipidemia   . Ulcerative colitis   . Allergy   . Prostate cancer     Past Surgical History  Procedure Laterality Date  . Inguinal hernia repair    . Amputation      right fourth toe  . Shoulder arthrotomy Right     Allergies  Allergen Reactions  . Sulfamethoxazole     REACTION: unspecified    Family history and social history have been reviewed.  Review of Systems: Negative for heartburn chest pain abdominal pain  The remainder of the 10 point ROS is negative except as outlined in the H&P  Physical Exam: General Appearance Well developed, in no distress Eyes  Non icteric  HEENT  Non traumatic, normocephalic  Mouth No lesion, tongue papillated, no cheilosis Neck Supple without adenopathy, thyroid not enlarged, no carotid bruits, no JVD Lungs Clear to auscultation bilaterally COR Normal S1, normal S2, regular rhythm, no murmur, quiet precordium Abdomen soft nontender abdomen with the rectus diathesis, liver edge at costal margin. Normal active bowel sounds Rectal firm prostate Hemoccult-negative stool Extremities  No pedal edema Skin No lesions Neurological Alert and oriented x 3 Psychological Normal mood  and affect  Assessment and Plan:   58 year old on white male with the ulcerative colitis of 13 years duration who has been in remission for past the 9 years. He will be due for colonoscopy on October 2016. We will refill Colazal 750 mg , once a day which may be increased to 2 a day depending on his symptoms. I will see him in one year    [name] 01/07/2014

## 2014-01-14 ENCOUNTER — Other Ambulatory Visit (HOSPITAL_COMMUNITY): Payer: Self-pay | Admitting: Urology

## 2014-01-14 DIAGNOSIS — C61 Malignant neoplasm of prostate: Secondary | ICD-10-CM

## 2014-01-21 ENCOUNTER — Ambulatory Visit (HOSPITAL_COMMUNITY)
Admission: RE | Admit: 2014-01-21 | Discharge: 2014-01-21 | Disposition: A | Discharge status: Home / Self Care | Payer: BC Managed Care – PPO | Source: Ambulatory Visit | Attending: Urology | Admitting: Urology

## 2014-01-21 DIAGNOSIS — K4090 Unilateral inguinal hernia, without obstruction or gangrene, not specified as recurrent: Secondary | ICD-10-CM | POA: Insufficient documentation

## 2014-01-21 DIAGNOSIS — C61 Malignant neoplasm of prostate: Secondary | ICD-10-CM

## 2014-01-21 MED ORDER — GADOBENATE DIMEGLUMINE 529 MG/ML IV SOLN
20.0000 mL | Freq: Once | INTRAVENOUS | Status: AC
  Administered 2014-01-21: 17 mL via INTRAVENOUS

## 2014-01-22 ENCOUNTER — Telehealth: Payer: Self-pay | Admitting: Internal Medicine

## 2014-01-22 NOTE — Telephone Encounter (Signed)
Is it ok for patient to take Meloxicam with his UC.  Dr. Olevia Perches please advise

## 2014-01-22 NOTE — Telephone Encounter (Signed)
OK to take but watch for diarrhea, I assume it is on temporary basis.

## 2014-01-25 NOTE — Telephone Encounter (Signed)
Yes temporary basis.  Patient is advised of recommendations.

## 2014-03-16 ENCOUNTER — Encounter: Payer: Self-pay | Admitting: Family Medicine

## 2014-03-16 ENCOUNTER — Ambulatory Visit (INDEPENDENT_AMBULATORY_CARE_PROVIDER_SITE_OTHER): Payer: BC Managed Care – PPO | Admitting: Family Medicine

## 2014-03-16 VITALS — BP 120/80 | Temp 98.2°F | Wt 181.0 lb

## 2014-03-16 DIAGNOSIS — C61 Malignant neoplasm of prostate: Secondary | ICD-10-CM | POA: Insufficient documentation

## 2014-03-16 DIAGNOSIS — E782 Mixed hyperlipidemia: Secondary | ICD-10-CM

## 2014-03-16 DIAGNOSIS — Z0000 Encounter for general adult medical examination without abnormal findings: Secondary | ICD-10-CM

## 2014-03-16 DIAGNOSIS — Z01818 Encounter for other preprocedural examination: Secondary | ICD-10-CM

## 2014-03-16 DIAGNOSIS — Z23 Encounter for immunization: Secondary | ICD-10-CM

## 2014-03-16 DIAGNOSIS — Z136 Encounter for screening for cardiovascular disorders: Secondary | ICD-10-CM

## 2014-03-16 DIAGNOSIS — Z01419 Encounter for gynecological examination (general) (routine) without abnormal findings: Secondary | ICD-10-CM

## 2014-03-16 DIAGNOSIS — K5190 Ulcerative colitis, unspecified, without complications: Secondary | ICD-10-CM

## 2014-03-16 NOTE — Progress Notes (Signed)
   Subjective:    Patient ID: [name], male    DOB: May 24, 1956, 58 y.o.   MRN: [id number]  HPI [name] is a 58 year old male who comes in for a preop clearance prior to prostate cancer surgery at [facility] scheduled for 04/14/2014  He is always been in excellent health he said that ulcerative colitis and has been treated in GI. He also has hyperlipidemia and Zocor 20 mg daily. He's had occasional outbreak of HSV 1 and takes acyclovir 500 mg twice a day when necessary  His PSA was elevated last spring on his physical examination to 6. He went and had an evaluation and was found to have cancer. He's consult with a urologist and [facility] in [place] and now at [facility]. He prefers to folks at [place].     Review of Systems Review of systems otherwise negative    Objective:   Physical Exam   Well-developed and nourished male no acute distress vital signs stable he is afebrile BP 120/80  HEENT negative neck was supple no adenopathy thyroid normal cardiopulmonary exam normal abdominal exam normal genital rectal exam done last spring normal therefore not repeated      Assessment & Plan:  Prostate cancer......... surgery October 7 at [facility]  History of ulcerative colitis........ continue current medication  History of hyperlipidemia............ continue current medication  Occasional HSV 1 outbreak..........Marland Kitchen Valtrex 500 mg twice a day when necessary

## 2014-03-16 NOTE — Patient Instructions (Signed)
Labs today  Chest x-ray tomorrow  Return Thursday afternoon and we will have your packet ready

## 2014-03-16 NOTE — Progress Notes (Signed)
Pre visit review using our clinic review tool, if applicable. No additional management support is needed unless otherwise documented below in the visit note. 

## 2014-03-17 ENCOUNTER — Ambulatory Visit (INDEPENDENT_AMBULATORY_CARE_PROVIDER_SITE_OTHER)
Admission: RE | Admit: 2014-03-17 | Discharge: 2014-03-17 | Disposition: A | Discharge status: Home / Self Care | Payer: BC Managed Care – PPO | Source: Ambulatory Visit | Attending: Family Medicine | Admitting: Family Medicine

## 2014-03-17 ENCOUNTER — Other Ambulatory Visit: Payer: BC Managed Care – PPO

## 2014-03-17 DIAGNOSIS — C61 Malignant neoplasm of prostate: Secondary | ICD-10-CM

## 2014-03-17 LAB — POCT URINALYSIS DIPSTICK
Bilirubin, UA: NEGATIVE
Blood, UA: NEGATIVE
GLUCOSE UA: NEGATIVE
Ketones, UA: NEGATIVE
LEUKOCYTES UA: NEGATIVE
Nitrite, UA: NEGATIVE
Protein, UA: NEGATIVE
SPEC GRAV UA: 1.015
UROBILINOGEN UA: 0.2
pH, UA: 6

## 2014-03-17 LAB — HEPATIC FUNCTION PANEL
ALBUMIN: 4.2 g/dL (ref 3.5–5.2)
ALT: 19 U/L (ref 0–53)
AST: 21 U/L (ref 0–37)
Alkaline Phosphatase: 48 U/L (ref 39–117)
BILIRUBIN DIRECT: 0.1 mg/dL (ref 0.0–0.3)
BILIRUBIN TOTAL: 0.7 mg/dL (ref 0.2–1.2)
TOTAL PROTEIN: 7.2 g/dL (ref 6.0–8.3)

## 2014-03-17 LAB — BASIC METABOLIC PANEL
BUN: 26 mg/dL — ABNORMAL HIGH (ref 6–23)
CALCIUM: 9.2 mg/dL (ref 8.4–10.5)
CO2: 26 meq/L (ref 19–32)
CREATININE: 1 mg/dL (ref 0.4–1.5)
Chloride: 105 mEq/L (ref 96–112)
GFR: 80.69 mL/min (ref >60.00)
Glucose, Bld: 109 mg/dL — ABNORMAL HIGH (ref 70–99)
Potassium: 4.8 mEq/L (ref 3.5–5.1)
SODIUM: 140 meq/L (ref 135–145)

## 2014-03-17 LAB — CBC WITH DIFFERENTIAL/PLATELET
Basophils Absolute: 0.1 10*3/uL (ref 0.0–0.1)
Basophils Relative: 1 % (ref 0.0–3.0)
EOS PCT: 2.5 % (ref 0.0–5.0)
Eosinophils Absolute: 0.1 10*3/uL (ref 0.0–0.7)
HCT: 44.2 % (ref 39.0–52.0)
Hemoglobin: 14.8 g/dL (ref 13.0–17.0)
LYMPHS PCT: 24.1 % (ref 12.0–46.0)
Lymphs Abs: 1.4 10*3/uL (ref 0.7–4.0)
MCHC: 33.5 g/dL (ref 30.0–36.0)
MCV: 93.6 fl (ref 78.0–100.0)
MONO ABS: 0.6 10*3/uL (ref 0.1–1.0)
Monocytes Relative: 10.5 % (ref 3.0–12.0)
NEUTROS PCT: 61.9 % (ref 43.0–77.0)
Neutro Abs: 3.5 10*3/uL (ref 1.4–7.7)
PLATELETS: 251 10*3/uL (ref 150.0–400.0)
RBC: 4.72 Mil/uL (ref 4.22–5.81)
RDW: 13.1 % (ref 11.5–15.5)
WBC: 5.7 10*3/uL (ref 4.0–10.5)

## 2014-04-20 ENCOUNTER — Encounter: Payer: Self-pay | Admitting: Family Medicine

## 2014-04-20 ENCOUNTER — Ambulatory Visit (INDEPENDENT_AMBULATORY_CARE_PROVIDER_SITE_OTHER): Payer: BC Managed Care – PPO | Admitting: Family Medicine

## 2014-04-20 VITALS — BP 120/80 | Temp 97.7°F | Wt 181.0 lb

## 2014-04-20 DIAGNOSIS — L02219 Cutaneous abscess of trunk, unspecified: Secondary | ICD-10-CM | POA: Insufficient documentation

## 2014-04-20 DIAGNOSIS — L03319 Cellulitis of trunk, unspecified: Secondary | ICD-10-CM

## 2014-04-20 MED ORDER — CEPHALEXIN 500 MG PO CAPS: ORAL_CAPSULE | ORAL | Status: DC

## 2014-04-20 NOTE — Patient Instructions (Signed)
Keflex 500 mg,,,,,,,,, 2 tabs twice daily for 1 week return when necessary

## 2014-04-20 NOTE — Progress Notes (Signed)
   Subjective:    Patient ID: [name], male    DOB: 1956-06-15, 58 y.o.   MRN: [id number]  HPI [name] is a 58 year old male who comes in today for evaluation of redness of his left hand  He had his prostate removed at [facility] on September 7. Now he has some redness and erythema of his hand no fever chills or back pain   Review of Systems    review of systems negative Objective:   Physical Exam  Well-developed well-nourished male no acute distress vital signs stable he is afebrile examination left hand shows some erythema no tenderness is not hot      Assessment & Plan:  Slight erythema left hand,,,,,,,,,,,

## 2014-04-20 NOTE — Progress Notes (Signed)
Pre visit review using our clinic review tool, if applicable. No additional management support is needed unless otherwise documented below in the visit note. 

## 2014-07-09 DIAGNOSIS — K760 Fatty (change of) liver, not elsewhere classified: Secondary | ICD-10-CM

## 2014-07-09 HISTORY — DX: Fatty (change of) liver, not elsewhere classified: K76.0

## 2014-08-11 ENCOUNTER — Other Ambulatory Visit (HOSPITAL_COMMUNITY): Payer: Self-pay | Admitting: Internal Medicine

## 2014-08-11 DIAGNOSIS — R222 Localized swelling, mass and lump, trunk: Secondary | ICD-10-CM

## 2014-08-11 DIAGNOSIS — C61 Malignant neoplasm of prostate: Secondary | ICD-10-CM

## 2014-08-17 ENCOUNTER — Encounter: Payer: Self-pay | Admitting: Physician Assistant

## 2014-08-17 ENCOUNTER — Ambulatory Visit (INDEPENDENT_AMBULATORY_CARE_PROVIDER_SITE_OTHER): Payer: BLUE CROSS/BLUE SHIELD | Admitting: Physician Assistant

## 2014-08-17 ENCOUNTER — Telehealth: Payer: Self-pay | Admitting: Internal Medicine

## 2014-08-17 ENCOUNTER — Other Ambulatory Visit (INDEPENDENT_AMBULATORY_CARE_PROVIDER_SITE_OTHER): Payer: BLUE CROSS/BLUE SHIELD

## 2014-08-17 VITALS — BP 138/80 | HR 88 | Ht 66.75 in | Wt 181.4 lb

## 2014-08-17 DIAGNOSIS — K51211 Ulcerative (chronic) proctitis with rectal bleeding: Secondary | ICD-10-CM

## 2014-08-17 DIAGNOSIS — K625 Hemorrhage of anus and rectum: Secondary | ICD-10-CM

## 2014-08-17 LAB — CBC WITH DIFFERENTIAL/PLATELET
BASOS ABS: 0 10*3/uL (ref 0.0–0.1)
Basophils Relative: 1.1 % (ref 0.0–3.0)
Eosinophils Absolute: 0.1 10*3/uL (ref 0.0–0.7)
Eosinophils Relative: 2.4 % (ref 0.0–5.0)
HCT: 40.6 % (ref 39.0–52.0)
HEMOGLOBIN: 14.1 g/dL (ref 13.0–17.0)
LYMPHS ABS: 1.1 10*3/uL (ref 0.7–4.0)
Lymphocytes Relative: 43 % (ref 12.0–46.0)
MCHC: 34.6 g/dL (ref 30.0–36.0)
MCV: 87.2 fl (ref 78.0–100.0)
MONO ABS: 0.1 10*3/uL (ref 0.1–1.0)
Monocytes Relative: 1.9 % — ABNORMAL LOW (ref 3.0–12.0)
Neutro Abs: 1.3 10*3/uL — ABNORMAL LOW (ref 1.4–7.7)
Neutrophils Relative %: 51.6 % (ref 43.0–77.0)
Platelets: 271 10*3/uL (ref 150.0–400.0)
RBC: 4.65 Mil/uL (ref 4.22–5.81)
RDW: 13 % (ref 11.5–15.5)
WBC: 2.6 10*3/uL — ABNORMAL LOW (ref 4.0–10.5)

## 2014-08-17 MED ORDER — MOVIPREP 100 G PO SOLR: 1.0000 | ORAL | Status: DC

## 2014-08-17 MED ORDER — BALSALAZIDE DISODIUM 750 MG PO CAPS: ORAL_CAPSULE | ORAL | Status: DC

## 2014-08-17 NOTE — Progress Notes (Signed)
Patient ID: ETHANIEL GARFIELD, male   DOB: June 08, 1956, 59 y.o.   MRN: [id number]   Subjective:    Patient ID: IZAYA NETHERTON, male    DOB: 20-Nov-1955, 59 y.o.   MRN: [id number]  HPI Iyad is a 59 year old white male known to Dr. [name] with history of left-sided ulcerative colitis. He was initially diagnosed in 2002. His disease has been quiescent over the past few years and last colonoscopy done in October 2011 was normal. The last bad flare was 7 or 8 years ago and was prolonged and required a couple of rounds of steroids. Patient comes in today because of onset of rectal bleeding earlier this morning. The patient had recently been diagnosed with prostate cancer and has just started chemotherapy. He is on Taxotere with plans for an infusion every 3 Saidi for 7 more sessions. His initial infusion was done one week ago. He is being followed by a Dr. Elza Rafter with the [facility]  in [place], [place]. He has been told that he may of a drop in his blood counts days 9 through 13 post chemotherapy. Patient says he does have some tenderness of his gums post chemotherapy and had some rectal soreness start a few days ago which has not really persisted. This morning he says he passed some bright red blood in the commode with blood sort of dripping into the commode and on the tissue he felt that the blood was separate from his bowel movement. He is not complaining of any rectal pain currently and has not had any further bleeding today. He says he has had some "grumbling over the last couple of days and some sense of urgency for bowel movements. His stools have been loose but he has not had diarrhea. Patient has been on Colazal ,but  says over the past year he really has only been taking that as needed and not on any sort of regular basis.  Review of Systems Pertinent positive and negative review of systems were noted in the above HPI section.  All other review of systems was otherwise negative.  Outpatient  Encounter Prescriptions as of 08/17/2014  Medication Sig  . amoxicillin-clavulanate (AUGMENTIN) 250-125 MG per tablet Take 1 tablet by mouth 3 (three) times daily.  . balsalazide (COLAZAL) 750 MG capsule Take 3 capsules twice daily  . bicalutamide (CASODEX) 50 MG tablet Take 50 mg by mouth daily.  . Cholecalciferol (VITAMIN D) 2000 UNITS tablet Take 4,000 Units by mouth daily.  . Degarelix Acetate (FIRMAGON [place]) Inject into the skin every 30 (thirty) days.  . DOCEtaxel (TAXOTERE IV) Inject into the vein every 21 ( twenty-one) days.  . finasteride (PROSCAR) 5 MG tablet Take 5 mg by mouth daily.  . fish oil-omega-3 fatty acids 1000 MG capsule Take 2 g by mouth daily.    Marland Kitchen glucosamine-chondroitin 500-400 MG tablet Take 1 tablet by mouth 3 (three) times daily.    . Lycopene 10 MG CAPS Take 10 mg by mouth.  . Multiple Vitamin (MULTIVITAMIN) tablet Take 1 tablet by mouth daily.    Marland Kitchen nystatin ointment (MYCOSTATIN) Apply topically 2 (two) times daily.  . simvastatin (ZOCOR) 20 MG tablet Take 1 tablet (20 mg total) by mouth at bedtime.  . Soy Isoflavone 100 MG CAPS Take 200 mg by mouth daily.  . valACYclovir (VALTREX) 500 MG tablet Take 1 tablet (500 mg total) by mouth 2 (two) times daily. TAKE 1 TABLET BY MOUTH TWICE DAILY PRN  . vitamin E  200 UNIT capsule Take 200 Units by mouth daily.  . [DISCONTINUED] balsalazide (COLAZAL) 750 MG capsule Take 1 capsule (750 mg total) by mouth 2 (two) times daily.  Marland Kitchen MOVIPREP 100 G SOLR Take 1 kit (200 g total) by mouth as directed.  . [DISCONTINUED] cephALEXin (KEFLEX) 500 MG capsule 2 by mouth twice a day (Patient not taking: Reported on 08/17/2014)   Allergies  Allergen Reactions  . Sulfamethoxazole     REACTION: unspecified   Patient Active Problem List   Diagnosis Date Noted  . Cellulitis and abscess of trunk 04/20/2014  . Prostate cancer 03/16/2014  . Cutaneous fungal infection 08/20/2013  . Boil, ear 05/12/2013  . PSA elevation 10/21/2012  . Urticaria  due to food allergy 01/02/2012  . Hearing loss sensory, bilateral 07/17/2011  . Herpes simplex 09/05/2010  . BENIGN NEOPLASM SKIN OTHER&UNSPEC PARTS FACE 09/06/2008  . OBSTRUCTIVE SLEEP APNEA 08/29/2007  . FAMILIAL COMBINED HYPERLIPIDEMIA 07/08/2007  . ULCERATIVE COLITIS 01/23/2007   History   Social History  . Marital Status: Married    Spouse Name: N/A    Number of Children: 3  . Years of Education: N/A   Occupational History  . Flooring Contractor    Social History Main Topics  . Smoking status: Former Research scientist (life sciences)  . Smokeless tobacco: Never Used  . Alcohol Use: Yes  . Drug Use: No  . Sexual Activity: Not on file   Other Topics Concern  . Not on file   Social History Narrative    Mr. Clabo family history includes Breast cancer in an other family member; Colon cancer in an other family member; Diabetes in an other family member; Heart attack in his father; Hyperlipidemia in an other family member; Lung cancer in an other family member; Thyroid disease in an other family member.      Objective:    Filed Vitals:   08/17/14 1404  BP: 138/80  Pulse: 88    Physical Exam  well-developed white male in no acute distress, blood pressure 138/80 pulse 88 height 5 foot 6 weight 181. HEENT; nontraumatic normocephalic EOMI PERRLA sclera anicteric, Supple; no JVD, Cardiovascular; regular rate and rhythm with S1-S2 no murmur or gallop, Pulmonary; clear bilaterally, Abdomen ;soft nontender nondistended bowel sounds are active no palpable mass or hepatosplenomegaly, Rectal; exam not done, Extremities; no clubbing cyanosis or edema skin warm and dry, Psych; mood and affect appropriate       Assessment & Plan:   #1 59 yo WM with hx of left sided ulcerative colitis which has been quiescent , on no regular  maintenance therapy with onset self limited hematochezia earlier today, and mild urgency over the past few days. This is in the setting of new chemotherapy with Taxotere for prostate  cancer. First infusion  one week ago R/O chemo induced colitis R/O chemo induced exacerbation of ulcerative colitis/proctitis  Plan; CBC today Restart Colazal 750 mg 3 tabs twice daily Schedule for Colonoscopy with Olevia Perches as soon as possible - this will need to be time around potential neutropenia 9-13 days post chemo. Have scheduled for 2/19 /2016 which will be after nadir in counts - pt very anxious about proceeding quickly - will see what his counts are today- may be able to do later this week. He also asks that Dr . Olevia Perches call his Oncologist to coordinate management- Dr. Elza Rafter [phone].  Amy S Esterwood PA-C 08/17/2014

## 2014-08-17 NOTE — Patient Instructions (Addendum)
Please go to the basement level to have your labs drawn.  You have been scheduled for a colonoscopy. Please follow written instructions given to you at your visit today.  Please pick up your prep kit at the pharmacy within the next 1-3 days. [facility] [address] [place]. If you use inhalers (even only as needed), please bring them with you on the day of your procedure. Your physician has requested that you go to www.startemmi.com and enter the access code given to you at your visit today. This web site gives a general overview about your procedure. However, you should still follow specific instructions given to you by our office regarding your preparation for the procedure.  We sent refills to [facility] [address] [facility] [place] forBalsalazide.  Take 3 capsules twice daily.

## 2014-08-17 NOTE — Progress Notes (Signed)
Reviewed and agree, suspect exacerbation of UC- he has been in remission for almost 10 years.I agree.with Colazal tid.

## 2014-08-17 NOTE — Telephone Encounter (Signed)
Spoke with patient and he started chemotherapy last week for prostate cancer(Taxotere) and 2 M[place] ago he started hormone therapy. He feels like he is starting to have a UC flare. He had a bowel movement this AM and dripped bright, red blood into toilet. He has called oncology also. Scheduled with Nicoletta Ba, PA today at 2:00 PM.

## 2014-08-18 ENCOUNTER — Telehealth: Payer: Self-pay | Admitting: *Deleted

## 2014-08-18 NOTE — Telephone Encounter (Signed)
Spoke with the triage nurse LaDonna at [facility] and faxed records to [phone]. She will have patient's labs on Monday faxed to [place].

## 2014-08-23 ENCOUNTER — Ambulatory Visit (HOSPITAL_COMMUNITY)
Admission: RE | Admit: 2014-08-23 | Discharge: 2014-08-23 | Disposition: A | Discharge status: Home / Self Care | Payer: BLUE CROSS/BLUE SHIELD | Source: Ambulatory Visit | Attending: Internal Medicine | Admitting: Internal Medicine

## 2014-08-23 DIAGNOSIS — R222 Localized swelling, mass and lump, trunk: Secondary | ICD-10-CM | POA: Insufficient documentation

## 2014-08-23 DIAGNOSIS — Z8546 Personal history of malignant neoplasm of prostate: Secondary | ICD-10-CM | POA: Insufficient documentation

## 2014-08-23 DIAGNOSIS — R937 Abnormal findings on diagnostic imaging of other parts of musculoskeletal system: Secondary | ICD-10-CM | POA: Insufficient documentation

## 2014-08-23 DIAGNOSIS — C61 Malignant neoplasm of prostate: Secondary | ICD-10-CM

## 2014-08-23 MED ORDER — GADOBENATE DIMEGLUMINE 529 MG/ML IV SOLN
20.0000 mL | Freq: Once | INTRAVENOUS | Status: AC | PRN
  Administered 2014-08-23: 16 mL via INTRAVENOUS

## 2014-08-27 ENCOUNTER — Ambulatory Visit (AMBULATORY_SURGERY_CENTER): Payer: BLUE CROSS/BLUE SHIELD | Admitting: Internal Medicine

## 2014-08-27 ENCOUNTER — Encounter: Payer: Self-pay | Admitting: Internal Medicine

## 2014-08-27 VITALS — BP 122/70 | HR 54 | Temp 97.4°F | Resp 9 | Ht 66.0 in | Wt 181.0 lb

## 2014-08-27 DIAGNOSIS — K621 Rectal polyp: Secondary | ICD-10-CM

## 2014-08-27 DIAGNOSIS — K625 Hemorrhage of anus and rectum: Secondary | ICD-10-CM

## 2014-08-27 DIAGNOSIS — D129 Benign neoplasm of anus and anal canal: Secondary | ICD-10-CM

## 2014-08-27 DIAGNOSIS — D128 Benign neoplasm of rectum: Secondary | ICD-10-CM

## 2014-08-27 DIAGNOSIS — K51911 Ulcerative colitis, unspecified with rectal bleeding: Secondary | ICD-10-CM

## 2014-08-27 MED ORDER — SODIUM CHLORIDE 0.9 % IV SOLN: 500.0000 mL | INTRAVENOUS | Status: DC

## 2014-08-27 NOTE — Progress Notes (Signed)
Called to room to assist during endoscopic procedure.  Patient ID and intended procedure confirmed with present staff. Received instructions for my participation in the procedure from the performing physician.  

## 2014-08-27 NOTE — Op Note (Signed)
[place]  [address] [place], [id number]   COLONOSCOPY PROCEDURE REPORT  PATIENT: [name], [name]  MR#: [id number] BIRTHDATE: 08/30/1955 , 58  yrs. old GENDER: male ENDOSCOPIST: Lafayette Dragon, MD REFERRED ID:[id number] Valora Piccolo, M.D. PROCEDURE DATE:  08/27/2014 PROCEDURE:   Colonoscopy with cold biopsy polypectomy and Colonoscopy with biopsy First Screening Colonoscopy - Avg.  risk and is 50 yrs.  old or older - No.  Prior Negative Screening - Now for repeat screening. N/A  History of Adenoma - Now for follow-up colonoscopy & has been > or = to 3 yrs.  N/A  Polyps Removed Today? Yes. ASA CLASS:   Class II INDICATIONS:story all for ulcerative colitis since 2002.  Iron colonoscopies in 2006 with flexible sigmoidoscopy in 2007.  Active colitis from 0-30 cm.  [place] onset all small volume bleeding. MEDICATIONS: Propofol 200 mg IV  DESCRIPTION OF PROCEDURE:   After the risks benefits and alternatives of the procedure were thoroughly explained, informed consent was obtained.  The digital rectal exam revealed no abnormalities of the rectum.   The LB [id number]  endoscope was introduced through the anus and advanced to the cecum, which was identified by both the appendix and ileocecal valve. No adverse events experienced.   The quality of the prep was good, using MoviPrep  The instrument was then slowly withdrawn as the colon was fully examined.      COLON FINDINGS: A sessile polyp measuring 3 mm in size was found in the rectum.  A polypectomy was performed with cold forceps.  The resection was complete, the polyp tissue was completely retrieved and sent to histology.colon mucosa was entirely normal. There was no evidence of active colitis. There was decrease in haustral folds in the sigmoid colon and hypervascularity of the rectosigmoid colon suggestive of chronic colitis. Random biopsies were obtained from the right colon. Ascending colon and from rectosigmoid  from 0-30 cm to rule out dysplasia  Retroflexed views revealed no abnormalities. The time to cecum=4 minutes 190 seconds.  Withdrawal time=8 minutes 52 seconds.  The scope was withdrawn and the procedure completed.  COMPLICATIONS: There were no immediate complications.  ENDOSCOPIC IMPRESSION: Sessile polyp was found in the rectum; polypectomy was performed with cold forceps  RECOMMENDATIONS: 1.  Await pathology results 2.  ContinueColazal 750 mg 1 po bid Recall colonoscopy in 5 years pending results of the polyp pathology  eSigned:  Lafayette Dragon, MD 08/27/2014 8:05 AM   cc:   PATIENT NAME:  [name], [name] MR#: [id number]

## 2014-08-27 NOTE — Progress Notes (Signed)
No problems noted in the recovery room. maw 

## 2014-08-27 NOTE — Progress Notes (Signed)
Procedure ends, to recovery, report given and VSS. 

## 2014-08-27 NOTE — Patient Instructions (Signed)
YOU HAD AN ENDOSCOPIC PROCEDURE TODAY AT THE [facility]: Refer to the procedure report that was given to you for any specific questions about what was found during the examination.  If the procedure report does not answer your questions, please call your gastroenterologist to clarify.  If you requested that your care partner not be given the details of your procedure findings, then the procedure report has been included in a sealed envelope for you to review at your convenience later.  YOU SHOULD EXPECT: Some feelings of bloating in the abdomen. Passage of more gas than usual.  Walking can help get rid of the air that was put into your GI tract during the procedure and reduce the bloating. If you had a lower endoscopy (such as a colonoscopy or flexible sigmoidoscopy) you may notice spotting of blood in your stool or on the toilet paper. If you underwent a bowel prep for your procedure, then you may not have a normal bowel movement for a few days.  DIET: Your first meal following the procedure should be a light meal and then it is ok to progress to your normal diet.  A half-sandwich or bowl of soup is an example of a good first meal.  Heavy or fried foods are harder to digest and may make you feel nauseous or bloated.  Likewise meals heavy in dairy and vegetables can cause extra gas to form and this can also increase the bloating.  Drink plenty of fluids but you should avoid alcoholic beverages for 24 hours.  ACTIVITY: Your care partner should take you home directly after the procedure.  You should plan to take it easy, moving slowly for the rest of the day.  You can resume normal activity the day after the procedure however you should NOT DRIVE or use heavy machinery for 24 hours (because of the sedation medicines used during the test).    SYMPTOMS TO REPORT IMMEDIATELY: A gastroenterologist can be reached at any hour.  During normal business hours, 8:30 AM to 5:00 PM Monday through Friday,  call [phone].  After hours and on weekends, please call the GI answering service at [phone] who will take a message and have the physician on call contact you.   Following lower endoscopy (colonoscopy or flexible sigmoidoscopy):  Excessive amounts of blood in the stool  Significant tenderness or worsening of abdominal pains  Swelling of the abdomen that is new, acute  Fever of 100F or higher  FOLLOW UP: If any biopsies were taken you will be contacted by phone or by letter within the next 1-3 [place].  Call your gastroenterologist if you have not heard about the biopsies in 3 [place].  Our staff will call the home number listed on your records the next business day following your procedure to check on you and address any questions or concerns that you may have at that time regarding the information given to you following your procedure. This is a courtesy call and so if there is no answer at the home number and we have not heard from you through the emergency physician on call, we will assume that you have returned to your regular daily activities without incident.  SIGNATURES/CONFIDENTIALITY: You and/or your care partner have signed paperwork which will be entered into your electronic medical record.  These signatures attest to the fact that that the information above on your After Visit Summary has been reviewed and is understood.  Full responsibility of the confidentiality of this   discharge information lies with you and/or your care-partner.     Handouts were given to your care partner on polyps and ulcerative colitis. You might notice some irritation in your nose or drainage.  This may cause feelings of congestion.  This is from the oxygen, which can be drying.  This is no cause for concern; this should clear up in a few days.  You may resume your current medications today. Await biopsy results. Please call if any questions or concerns.

## 2014-08-30 ENCOUNTER — Telehealth: Payer: Self-pay | Admitting: *Deleted

## 2014-08-30 NOTE — Telephone Encounter (Signed)
  Follow up Call-  Call back number 08/27/2014  Post procedure Call Back phone  # [phone]  Permission to leave phone message Yes     Patient questions:  Do you have a fever, pain , or abdominal swelling? No. Pain Score  0 *  Have you tolerated food without any problems? Yes.    Have you been able to return to your normal activities? Yes.    Do you have any questions about your discharge instructions: Diet   No. Medications  No. Follow up visit  No.  Do you have questions or concerns about your Care? No.  Actions: * If pain score is 4 or above: No action needed, pain <4.

## 2014-08-31 ENCOUNTER — Encounter: Payer: Self-pay | Admitting: Internal Medicine

## 2014-12-20 ENCOUNTER — Other Ambulatory Visit: Payer: Self-pay | Admitting: Family Medicine

## 2015-04-04 ENCOUNTER — Other Ambulatory Visit: Payer: Self-pay | Admitting: Family Medicine

## 2015-04-09 HISTORY — PX: PROSTATECTOMY: SHX69

## 2015-05-26 ENCOUNTER — Telehealth: Payer: Self-pay | Admitting: Internal Medicine

## 2015-05-26 NOTE — Telephone Encounter (Signed)
Spoke with patient's wife and he has had rectal bleeding x 1 month. She states his oncologist at [facility] Dr. Alvester Chou wanted him to see his GI MD. Scheduled with Alonza Bogus, PA on 05/31/15 at 3:00 PM. She states he would like Dr. Havery Moros as his new MD.

## 2015-05-31 ENCOUNTER — Encounter: Payer: Self-pay | Admitting: Gastroenterology

## 2015-05-31 ENCOUNTER — Ambulatory Visit (INDEPENDENT_AMBULATORY_CARE_PROVIDER_SITE_OTHER): Payer: BLUE CROSS/BLUE SHIELD | Admitting: Gastroenterology

## 2015-05-31 VITALS — BP 108/72 | HR 72 | Ht 66.0 in | Wt 185.4 lb

## 2015-05-31 DIAGNOSIS — K625 Hemorrhage of anus and rectum: Secondary | ICD-10-CM | POA: Insufficient documentation

## 2015-05-31 DIAGNOSIS — K5190 Ulcerative colitis, unspecified, without complications: Secondary | ICD-10-CM | POA: Insufficient documentation

## 2015-05-31 DIAGNOSIS — K51911 Ulcerative colitis, unspecified with rectal bleeding: Secondary | ICD-10-CM | POA: Diagnosis not present

## 2015-05-31 NOTE — Patient Instructions (Addendum)
Use an over the counter Zinc oxide cream such as Desitin at bedtime to peri-anal area.   Continue Colozal.

## 2015-05-31 NOTE — Progress Notes (Signed)
     05/31/2015 [name] [id number] Apr 12, 1956   History of Present Illness:  This is a 59 year old white male previously known to Dr. [name].  His care will be assumed by Dr. Havery Moros in her absence.  He has left-sided ulcerative colitis. He was initially diagnosed in 2002. His disease has been quiescent over the past few years and colonoscopy done in October 2011 was normal.  Most recent colonoscopy in 08/2014 was also normal except for a sessile polyp in the rectum that was a tubular adenoma on pathology. Biopsies of the cecum, right colon, descending colon, sigmoid and rectum were all benign colonic mucosa. He is currently on colazol 750 mg 2 tablets twice a day. He presents to our office today at his wife's request for complaints of rectal bleeding. This bleeding is described as traces of bright red blood seen only on the toilet paper intermittently. He has not seen any blood in the last several days. He denies any other colitis symptoms such as diarrhea, abdominal pain, and bloody stools, etc. He is having one to 2 soft, formed bowel movements daily. He denies any rectal or anal discomfort. The last bad flare of his colitis was 8 or 9 years ago and was prolonged and required a couple of rounds of steroids.  He gets regular labs done weekly at [facility] and on 11/16 Hgb was 12.8 grams, renal and hepatic function were WNL's.   Current Medications, Allergies, Past Medical History, Past Surgical History, Family History and Social History were reviewed in [employer] record.   Physical Exam: BP 108/72 mmHg  Pulse 72  Ht 5\' 6"  (1.676 m)  Wt 185 lb 6.4 oz (84.097 kg)  BMI 29.94 kg/m2 General: Well developed white male in no acute distress Head: Normocephalic and atraumatic Eyes:  Sclerae anicteric, conjunctiva pink  Ears: Normal auditory acuity Lungs: Clear throughout to auscultation Heart: Regular rate and rhythm Abdomen: Soft, non-distended.  Normal bowel  sounds.  Non-tender. Rectal:  Some peri-anal irritation seen on exam today but no anal fissure or external hemorrhoids.  DRE revealed no masses and only trace light brown, heme negative stool on exam glove.   Musculoskeletal: Symmetrical with no gross deformities  Extremities: No edema  Neurological: Alert oriented x 4, grossly non-focal Psychological:  Alert and cooperative. Normal mood and affect  Assessment and Recommendations: -Rectal bleeding:  Likely outlet bleeding or from external irritation.  Some mild peri-anal irritation seen today but no other findings.  Will try Zinc oxide applied to peri-anal area at bedtime and other times prn.   -Ulcerative colitis:  Well controlled on Colazol 1500 mg BID.  Will continue.

## 2015-06-03 NOTE — Progress Notes (Signed)
Agree with assessment and plan with the following suggestions. UC index symptoms appear well controlled however having some rectal bleeding noted on the toilet paper only, more consistent with perianal source, however mild anemia noted from recent labs.[name] could you review old labs to assess for iron deficiency and send iron studies if not done. Is anemia old or new? I would recommend if symptoms persist after a few Blust he let us know and would consider flex sig or colonoscopy to further evaluate. His surveillance colonoscopy is otherwise up to date, last done 08/2014.

## 2016-08-01 ENCOUNTER — Encounter: Payer: Self-pay | Admitting: Family Medicine

## 2016-08-01 ENCOUNTER — Ambulatory Visit (INDEPENDENT_AMBULATORY_CARE_PROVIDER_SITE_OTHER): Payer: BLUE CROSS/BLUE SHIELD | Admitting: Family Medicine

## 2016-08-01 DIAGNOSIS — J069 Acute upper respiratory infection, unspecified: Secondary | ICD-10-CM | POA: Insufficient documentation

## 2016-08-01 DIAGNOSIS — B9789 Other viral agents as the cause of diseases classified elsewhere: Secondary | ICD-10-CM | POA: Diagnosis not present

## 2016-08-01 MED ORDER — HYDROCODONE-HOMATROPINE 5-1.5 MG/5ML PO SYRP: 5.0000 mL | ORAL_SOLUTION | Freq: Three times a day (TID) | ORAL | 0 refills | Status: DC | PRN

## 2016-08-01 NOTE — Progress Notes (Signed)
[name] is a 61 year old married male nonsmoker comes in with a four-day history of the flu  It started over the weekend with sore throat then he developed head congestion fever chills and cough. No nausea vomiting or diarrhea  Review of systems otherwise negative history of any pulmonary disease asthma etc.  BP 100/60 (BP Location: Right Arm, Patient Position: Sitting, Cuff Size: Normal)   Pulse 95   Temp 99.7 F (37.6 C) (Oral)   Ht 5\' 6"  (1.676 m)   Wt 173 lb 8 oz (78.7 kg)   BMI 28.00 kg/m  Examination HEENT were negative neck was supple no adenopathy thyroid normal cardiopulmonary exam normal  #1 viral infection.......... treat symptomatically Tylenol liquids cough syrup

## 2016-08-01 NOTE — Patient Instructions (Signed)
Drink lots of liquids  Tylenol......... 2 tabs 3 times daily for fever chills  Hydromet.......Marland Kitchen 1/2-1 teaspoon 3 times daily as needed for cough  Return when necessary

## 2016-08-28 ENCOUNTER — Other Ambulatory Visit: Payer: Self-pay | Admitting: Family Medicine

## 2016-08-28 ENCOUNTER — Ambulatory Visit
Admission: RE | Admit: 2016-08-28 | Discharge: 2016-08-28 | Disposition: A | Discharge status: Home / Self Care | Payer: BLUE CROSS/BLUE SHIELD | Source: Ambulatory Visit | Attending: Family Medicine | Admitting: Family Medicine

## 2016-08-28 DIAGNOSIS — R0781 Pleurodynia: Secondary | ICD-10-CM

## 2016-09-01 ENCOUNTER — Emergency Department (HOSPITAL_BASED_OUTPATIENT_CLINIC_OR_DEPARTMENT_OTHER)
Admission: EM | Admit: 2016-09-01 | Discharge: 2016-09-01 | Disposition: A | Discharge status: Home / Self Care | Payer: BLUE CROSS/BLUE SHIELD | Source: Home / Self Care | Attending: Emergency Medicine | Admitting: Emergency Medicine

## 2016-09-01 ENCOUNTER — Emergency Department (HOSPITAL_BASED_OUTPATIENT_CLINIC_OR_DEPARTMENT_OTHER): Payer: BLUE CROSS/BLUE SHIELD

## 2016-09-01 ENCOUNTER — Encounter (HOSPITAL_BASED_OUTPATIENT_CLINIC_OR_DEPARTMENT_OTHER): Payer: Self-pay | Admitting: Respiratory Therapy

## 2016-09-01 DIAGNOSIS — J189 Pneumonia, unspecified organism: Secondary | ICD-10-CM | POA: Diagnosis not present

## 2016-09-01 DIAGNOSIS — Z87891 Personal history of nicotine dependence: Secondary | ICD-10-CM

## 2016-09-01 DIAGNOSIS — Z8546 Personal history of malignant neoplasm of prostate: Secondary | ICD-10-CM | POA: Insufficient documentation

## 2016-09-01 DIAGNOSIS — J90 Pleural effusion, not elsewhere classified: Secondary | ICD-10-CM | POA: Insufficient documentation

## 2016-09-01 DIAGNOSIS — Z79899 Other long term (current) drug therapy: Secondary | ICD-10-CM | POA: Insufficient documentation

## 2016-09-01 DIAGNOSIS — J181 Lobar pneumonia, unspecified organism: Principal | ICD-10-CM

## 2016-09-01 DIAGNOSIS — J869 Pyothorax without fistula: Secondary | ICD-10-CM | POA: Diagnosis not present

## 2016-09-01 DIAGNOSIS — R091 Pleurisy: Secondary | ICD-10-CM

## 2016-09-01 LAB — BASIC METABOLIC PANEL
Anion gap: 8 (ref 5–15)
BUN: 25 mg/dL — AB (ref 6–20)
CHLORIDE: 103 mmol/L (ref 101–111)
CO2: 26 mmol/L (ref 22–32)
Calcium: 8.9 mg/dL (ref 8.9–10.3)
Creatinine, Ser: 1.05 mg/dL (ref 0.61–1.24)
GFR calc Af Amer: >60 mL/min (ref >60)
GFR calc non Af Amer: >60 mL/min (ref >60)
Glucose, Bld: 128 mg/dL — ABNORMAL HIGH (ref 65–99)
POTASSIUM: 4.1 mmol/L (ref 3.5–5.1)
Sodium: 137 mmol/L (ref 135–145)

## 2016-09-01 LAB — CBC WITH DIFFERENTIAL/PLATELET
Basophils Absolute: 0 10*3/uL (ref 0.0–0.1)
Basophils Relative: 0 %
EOS ABS: 0.2 10*3/uL (ref 0.0–0.7)
Eosinophils Relative: 1 %
HEMATOCRIT: 36.2 % — AB (ref 39.0–52.0)
HEMOGLOBIN: 12.1 g/dL — AB (ref 13.0–17.0)
LYMPHS ABS: 0.9 10*3/uL (ref 0.7–4.0)
LYMPHS PCT: 7 %
MCH: 31.8 pg (ref 26.0–34.0)
MCHC: 33.4 g/dL (ref 30.0–36.0)
MCV: 95 fL (ref 78.0–100.0)
MONOS PCT: 8 %
Monocytes Absolute: 0.9 10*3/uL (ref 0.1–1.0)
NEUTROS ABS: 9.6 10*3/uL — AB (ref 1.7–7.7)
NEUTROS PCT: 84 %
Platelets: 417 10*3/uL — ABNORMAL HIGH (ref 150–400)
RBC: 3.81 MIL/uL — AB (ref 4.22–5.81)
RDW: 12 % (ref 11.5–15.5)
WBC: 11.6 10*3/uL — AB (ref 4.0–10.5)

## 2016-09-01 MED ORDER — IOPAMIDOL (ISOVUE-370) INJECTION 76%
100.0000 mL | Freq: Once | INTRAVENOUS | Status: AC | PRN
  Administered 2016-09-01: 100 mL via INTRAVENOUS

## 2016-09-01 MED ORDER — FENTANYL CITRATE (PF) 100 MCG/2ML IJ SOLN
100.0000 ug | Freq: Once | INTRAMUSCULAR | Status: AC
  Administered 2016-09-01: 100 ug via INTRAVENOUS
  Filled 2016-09-01: qty 2

## 2016-09-01 MED ORDER — SODIUM CHLORIDE 0.9 % IV SOLN
Freq: Once | INTRAVENOUS | Status: AC
  Administered 2016-09-01: 07:00:00 via INTRAVENOUS

## 2016-09-01 MED ORDER — ONDANSETRON HCL 4 MG/2ML IJ SOLN
4.0000 mg | Freq: Once | INTRAMUSCULAR | Status: AC
  Administered 2016-09-01: 4 mg via INTRAVENOUS
  Filled 2016-09-01: qty 2

## 2016-09-01 MED ORDER — HYDROCODONE-ACETAMINOPHEN 5-325 MG PO TABS
2.0000 | ORAL_TABLET | Freq: Once | ORAL | Status: AC
  Administered 2016-09-01: 2 via ORAL
  Filled 2016-09-01: qty 2

## 2016-09-01 MED ORDER — IBUPROFEN 800 MG PO TABS
800.0000 mg | ORAL_TABLET | Freq: Once | ORAL | Status: AC
  Administered 2016-09-01: 800 mg via ORAL
  Filled 2016-09-01: qty 1

## 2016-09-01 MED ORDER — NAPROXEN 500 MG PO TABS: 500.0000 mg | ORAL_TABLET | Freq: Two times a day (BID) | ORAL | 0 refills | Status: DC

## 2016-09-01 MED ORDER — LEVOFLOXACIN 500 MG PO TABS: 500.0000 mg | ORAL_TABLET | Freq: Every day | ORAL | 0 refills | Status: DC

## 2016-09-01 MED ORDER — LEVOFLOXACIN 750 MG PO TABS
750.0000 mg | ORAL_TABLET | Freq: Once | ORAL | Status: AC
  Administered 2016-09-01: 750 mg via ORAL
  Filled 2016-09-01: qty 1

## 2016-09-01 MED ORDER — HYDROCODONE-ACETAMINOPHEN 5-325 MG PO TABS: 1.0000 | ORAL_TABLET | ORAL | 0 refills | Status: DC | PRN

## 2016-09-01 NOTE — ED Provider Notes (Signed)
Pt discussed with Dr. Florina Ou. Care assumed. CT reviewed, and removal results discussed with patient. Has stable rib metastasis. Small left pleural effusion, and left lower lobe infiltrate. Patient given IV pain medication. Given by mouth ibuprofen, Vicodin, and Levaquin. Discussed cough and deep breathing. Prescriptions for naproxen, Levaquin, Vicodin for home. Primary care follow-up. ER with worsening or acute changes.    Tanna Furry, MD 09/01/16 [phone]

## 2016-09-01 NOTE — ED Notes (Signed)
ED Provider at bedside. 

## 2016-09-01 NOTE — Discharge Instructions (Signed)
Cough, and/or deep breathe 10x each hour.

## 2016-09-01 NOTE — ED Provider Notes (Signed)
[facility] Provider Note: Jeffery Spurling, MD, FACEP  CSN: [id number] MRN: [id number] ARRIVAL: 09/01/16 at 0512 ROOM: [place]  [name] is a 61 y.o. male with prostate cancer. He has had left sided posterior rib pain for about the last 3 Faries. The pain is worse with deep breathing. He was seen by his PCP 3 days ago and had left-sided rib films which were unremarkable. The pain acutely worsened this morning and he is having difficulty taking a deep breath due to the pain. He describes the pain as dull and severe. It is not well localized and is felt overall an area about the size of the palm of his hand. He denies chest trauma. He woke up in a sweat 2 nights ago and thinks he may have had a fever then. He is having some nausea with this pain but no vomiting or diarrhea. He denies lower extremity pain or swelling.   Past Medical History:  Diagnosis Date  . Allergy   . Anxiety   . Arthritis   . Hyperlipidemia   . Neoplasm of face    benign  . Prostate cancer ([place])   . Sleep apnea   . Ulcerative colitis     Past Surgical History:  Procedure Laterality Date  . INGUINAL HERNIA REPAIR    . SHOULDER ARTHROTOMY Right     Family History  Problem Relation Age of Onset  . Heart attack Father   . Hyperlipidemia    . Diabetes    . Thyroid disease    . Lung cancer    . Breast cancer    . Colon cancer      Social History  Substance Use Topics  . Smoking status: Former Research scientist (life sciences)  . Smokeless tobacco: Never Used  . Alcohol use 0.0 oz/week    Prior to Admission medications   Medication Sig Start Date End Date Taking? Authorizing Provider  valACYclovir (VALTREX) 500 MG tablet Take 500 mg by mouth 2 (two) times daily. PRN   Yes Historical Provider, MD  balsalazide (COLAZAL) 750 MG capsule Take 2,250 mg by mouth daily.    Historical Provider, MD  Cholecalciferol (VITAMIN D) 2000 UNITS tablet Take  4,000 Units by mouth daily.    Historical Provider, MD  fish oil-omega-3 fatty acids 1000 MG capsule Take 2 g by mouth daily.      Historical Provider, MD  glucosamine-chondroitin 500-400 MG tablet Take 1 tablet by mouth 3 (three) times daily.      Historical Provider, MD  HYDROcodone-acetaminophen (NORCO/VICODIN) 5-325 MG tablet Take 1 tablet by mouth every 4 (four) hours as needed. 09/01/16   Tanna Furry, MD  HYDROcodone-homatropine [facility]) 5-1.5 MG/5ML syrup Take 5 mLs by mouth every 8 (eight) hours as needed. 08/01/16   Dorena Cookey, MD  levofloxacin (LEVAQUIN) 500 MG tablet Take 1 tablet (500 mg total) by mouth daily. 09/01/16   Tanna Furry, MD  Lycopene 10 MG CAPS Take 10 mg by mouth.    Historical Provider, MD  Multiple Vitamin (MULTIVITAMIN) tablet Take 1 tablet by mouth daily.      Historical Provider, MD  naproxen (NAPROSYN) 500 MG tablet Take 1 tablet (500 mg total) by mouth 2 (two) times daily. 09/01/16   Tanna Furry, MD  vitamin E 200 UNIT capsule Take 200 Units by mouth daily.    Historical Provider, MD    Allergies Sulfamethoxazole   REVIEW  OF SYSTEMS  Negative except as noted here or in the History of Present Illness.   PHYSICAL EXAMINATION  Initial Vital Signs Blood pressure 120/69, pulse 73, temperature 98.4 F (36.9 C), temperature source Oral, resp. rate 18, height 5\' 6"  (1.676 m), weight 172 lb (78 kg), SpO2 98 %.  Examination General: Well-developed, well-nourished male in no acute distress; appearance consistent with age of record HENT: normocephalic; atraumatic Eyes: pupils equal, round and reactive to light; extraocular muscles intact Neck: supple Heart: regular rate and rhythm Lungs: Shallow breaths; diminished sounds left base Chest: Nontender; no rash Abdomen: soft; nondistended; nontender; no masses or hepatosplenomegaly; bowel sounds present Extremities: No deformity; full range of motion; pulses normal Neurologic: Awake, alert and oriented; motor  function intact in all extremities and symmetric; no facial droop Skin: Warm and dry Psychiatric: Flat affect   RESULTS  Summary of this visit's results, reviewed by myself:   EKG Interpretation  Date/Time:  Saturday September 01 2016 05:35:45 EST Ventricular Rate:  78 PR Interval:    QRS Duration: 98 QT Interval:  365 QTC Calculation: 416 R Axis:   4 Text Interpretation:  Sinus rhythm RSR' in V1 or V2, right VCD or RVH No previous ECGs available Confirmed by Keelynn Furgerson  MD, [name] ([id number]) on 09/01/2016 5:41:53 AM      Laboratory Studies: Results for orders placed or performed during the hospital encounter of 09/01/16 (from the past 24 hour(s))  CBC with Differential/Platelet     Status: Abnormal   Collection Time: 09/01/16  6:50 AM  Result Value Ref Range   WBC 11.6 (H) 4.0 - 10.5 K/uL   RBC 3.81 (L) 4.22 - 5.81 MIL/uL   Hemoglobin 12.1 (L) 13.0 - 17.0 g/dL   HCT 36.2 (L) 39.0 - 52.0 %   MCV 95.0 78.0 - 100.0 fL   MCH 31.8 26.0 - 34.0 pg   MCHC 33.4 30.0 - 36.0 g/dL   RDW 12.0 11.5 - 15.5 %   Platelets 417 (H) 150 - 400 K/uL   Neutrophils Relative % 84 %   Neutro Abs 9.6 (H) 1.7 - 7.7 K/uL   Lymphocytes Relative 7 %   Lymphs Abs 0.9 0.7 - 4.0 K/uL   Monocytes Relative 8 %   Monocytes Absolute 0.9 0.1 - 1.0 K/uL   Eosinophils Relative 1 %   Eosinophils Absolute 0.2 0.0 - 0.7 K/uL   Basophils Relative 0 %   Basophils Absolute 0.0 0.0 - 0.1 K/uL  Basic metabolic panel     Status: Abnormal   Collection Time: 09/01/16  6:50 AM  Result Value Ref Range   Sodium 137 135 - 145 mmol/L   Potassium 4.1 3.5 - 5.1 mmol/L   Chloride 103 101 - 111 mmol/L   CO2 26 22 - 32 mmol/L   Glucose, Bld 128 (H) 65 - 99 mg/dL   BUN 25 (H) 6 - 20 mg/dL   Creatinine, Ser 1.05 0.61 - 1.24 mg/dL   Calcium 8.9 8.9 - 10.3 mg/dL   GFR calc non Af Amer >60 >60 mL/min   GFR calc Af Amer >60 >60 mL/min   Anion gap 8 5 - 15   Imaging Studies: Dg Chest 2 View  Result Date: 09/01/2016 CLINICAL DATA:   Initial evaluation for acute shortness of breath. EXAM: CHEST  2 VIEW COMPARISON:  Prior radiograph from 08/28/2016. FINDINGS: Mild cardiomegaly, stable. Mediastinal silhouette within normal limits. Lungs are hypoinflated. Patchy left basilar opacity, which may reflect atelectasis and/or infiltrates. No other focal  airspace disease. No pulmonary edema or pleural effusion. No pneumothorax. No acute osseous abnormality. IMPRESSION: Shallow lung inflation with patchy left basilar opacity, which may reflect atelectasis and/ or infiltrate. Electronically Signed   By: Jeannine Boga M.D.   On: 09/01/2016 06:50   Ct Angio Chest Pe W Or Wo Contrast  Result Date: 09/01/2016 CLINICAL DATA:  Shortness of breath and left chest pain. History of metastatic prostate carcinoma. EXAM: CT ANGIOGRAPHY CHEST WITH CONTRAST TECHNIQUE: Multidetector CT imaging of the chest was performed using the standard protocol during bolus administration of intravenous contrast. Multiplanar CT image reconstructions and MIPs were obtained to evaluate the vascular anatomy. CONTRAST:  100 cc Isovue 370. COMPARISON:  MRI chest 08/23/2014. PA and lateral chest this same day and 08/28/2016. FINDINGS: Cardiovascular: No pulmonary embolus is identified. Heart size is mildly enlarged. No pericardial effusion. Mediastinum/Nodes: No enlarged mediastinal, hilar, or axillary lymph nodes. Thyroid gland, trachea, and esophagus demonstrate no significant findings. Lungs/Pleura: The patient has small left pleural effusion. No right pleural effusion is identified. Left lower lobe airspace disease has an appearance most worrisome for pneumonia. Mild dependent airspace disease on the right is most in keeping with atelectasis. No nodule or mass is seen. Upper Abdomen: Small amount of perisplenic fluid is seen. 0.5 cm in diameter hypoattenuating lesion in the left hepatic lobe is most consistent with a cyst and unchanged. Musculoskeletal: Sclerotic lesion in the  posterior arc of the left sixth rib consistent with metastatic disease is identified as seen on the prior MRI. Small densely sclerotic lesion in a mid thoracic vertebral body has appearance most suggestive of a bone island. Review of the MIP images confirms the above findings. IMPRESSION: Negative for pulmonary embolus. Left lower lobe airspace disease and small effusion most consistent with pneumonia. 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 Electronically Signed   By: Inge Rise M.D.   On: 09/01/2016 08:45    ED COURSE  Nursing notes and initial vitals signs, including pulse oximetry, reviewed.  Vitals:   09/01/16 0711 09/01/16 0806 09/01/16 0900 09/01/16 0930  BP: 112/71 130/74 119/76   Pulse: 79 90 92 86  Resp: 19 25 23  (!) 27  Temp:      TempSrc:      SpO2: 96% 95% 94% 93%  Weight:      Height:        PROCEDURES    ED DIAGNOSES     ICD-9-CM ICD-10-CM   1. Community acquired pneumonia of left lower lobe of lung ([place]) 481 J18.1   2. Pleural effusion 511.9 J90   3. Pleurisy 511.0 R09.1        Shanon Rosser, MD 09/01/16 [phone]

## 2016-09-01 NOTE — ED Notes (Signed)
Pt placed on O2@3L . Pt O2 sats immediately jumped up to 95%. Pt states "That feels much better. I'm not having to take such deep breaths."

## 2016-09-01 NOTE — ED Triage Notes (Signed)
Pt woke thi am x 1 hour ago with [facility] and left sided back pain. Was seen by PCP on Wed - Chest xray dx with rib pain

## 2016-09-02 ENCOUNTER — Emergency Department (HOSPITAL_BASED_OUTPATIENT_CLINIC_OR_DEPARTMENT_OTHER): Payer: BLUE CROSS/BLUE SHIELD

## 2016-09-02 ENCOUNTER — Inpatient Hospital Stay (HOSPITAL_BASED_OUTPATIENT_CLINIC_OR_DEPARTMENT_OTHER)
Admission: EM | Admit: 2016-09-02 | Discharge: 2016-09-11 | DRG: 163 | Disposition: A | Discharge status: Home Health | Payer: BLUE CROSS/BLUE SHIELD | Attending: Thoracic Surgery (Cardiothoracic Vascular Surgery) | Admitting: Thoracic Surgery (Cardiothoracic Vascular Surgery)

## 2016-09-02 ENCOUNTER — Encounter (HOSPITAL_BASED_OUTPATIENT_CLINIC_OR_DEPARTMENT_OTHER): Payer: Self-pay | Admitting: *Deleted

## 2016-09-02 DIAGNOSIS — J90 Pleural effusion, not elsewhere classified: Secondary | ICD-10-CM | POA: Diagnosis present

## 2016-09-02 DIAGNOSIS — Z882 Allergy status to sulfonamides status: Secondary | ICD-10-CM | POA: Diagnosis not present

## 2016-09-02 DIAGNOSIS — J869 Pyothorax without fistula: Secondary | ICD-10-CM | POA: Diagnosis present

## 2016-09-02 DIAGNOSIS — C7951 Secondary malignant neoplasm of bone: Secondary | ICD-10-CM | POA: Diagnosis present

## 2016-09-02 DIAGNOSIS — C61 Malignant neoplasm of prostate: Secondary | ICD-10-CM | POA: Diagnosis present

## 2016-09-02 DIAGNOSIS — Z23 Encounter for immunization: Secondary | ICD-10-CM | POA: Diagnosis not present

## 2016-09-02 DIAGNOSIS — K5190 Ulcerative colitis, unspecified, without complications: Secondary | ICD-10-CM | POA: Diagnosis present

## 2016-09-02 DIAGNOSIS — Z9221 Personal history of antineoplastic chemotherapy: Secondary | ICD-10-CM

## 2016-09-02 DIAGNOSIS — K51911 Ulcerative colitis, unspecified with rectal bleeding: Secondary | ICD-10-CM

## 2016-09-02 DIAGNOSIS — Z9689 Presence of other specified functional implants: Secondary | ICD-10-CM

## 2016-09-02 DIAGNOSIS — G4730 Sleep apnea, unspecified: Secondary | ICD-10-CM | POA: Diagnosis present

## 2016-09-02 DIAGNOSIS — Z79899 Other long term (current) drug therapy: Secondary | ICD-10-CM

## 2016-09-02 DIAGNOSIS — Z801 Family history of malignant neoplasm of trachea, bronchus and lung: Secondary | ICD-10-CM | POA: Diagnosis not present

## 2016-09-02 DIAGNOSIS — R079 Chest pain, unspecified: Secondary | ICD-10-CM

## 2016-09-02 DIAGNOSIS — E785 Hyperlipidemia, unspecified: Secondary | ICD-10-CM | POA: Diagnosis present

## 2016-09-02 DIAGNOSIS — Z8349 Family history of other endocrine, nutritional and metabolic diseases: Secondary | ICD-10-CM | POA: Diagnosis not present

## 2016-09-02 DIAGNOSIS — M1990 Unspecified osteoarthritis, unspecified site: Secondary | ICD-10-CM | POA: Diagnosis present

## 2016-09-02 DIAGNOSIS — Z9981 Dependence on supplemental oxygen: Secondary | ICD-10-CM | POA: Diagnosis not present

## 2016-09-02 DIAGNOSIS — R0902 Hypoxemia: Secondary | ICD-10-CM | POA: Diagnosis not present

## 2016-09-02 DIAGNOSIS — J939 Pneumothorax, unspecified: Secondary | ICD-10-CM

## 2016-09-02 DIAGNOSIS — R042 Hemoptysis: Secondary | ICD-10-CM | POA: Diagnosis not present

## 2016-09-02 DIAGNOSIS — B9689 Other specified bacterial agents as the cause of diseases classified elsewhere: Secondary | ICD-10-CM | POA: Diagnosis not present

## 2016-09-02 DIAGNOSIS — R918 Other nonspecific abnormal finding of lung field: Secondary | ICD-10-CM | POA: Diagnosis not present

## 2016-09-02 DIAGNOSIS — J181 Lobar pneumonia, unspecified organism: Secondary | ICD-10-CM | POA: Diagnosis not present

## 2016-09-02 DIAGNOSIS — Z803 Family history of malignant neoplasm of breast: Secondary | ICD-10-CM | POA: Diagnosis not present

## 2016-09-02 DIAGNOSIS — D649 Anemia, unspecified: Secondary | ICD-10-CM | POA: Diagnosis present

## 2016-09-02 DIAGNOSIS — D62 Acute posthemorrhagic anemia: Secondary | ICD-10-CM | POA: Diagnosis not present

## 2016-09-02 DIAGNOSIS — J189 Pneumonia, unspecified organism: Secondary | ICD-10-CM | POA: Diagnosis present

## 2016-09-02 DIAGNOSIS — Z800 Family history of malignant neoplasm of digestive organs: Secondary | ICD-10-CM | POA: Diagnosis not present

## 2016-09-02 DIAGNOSIS — Z87891 Personal history of nicotine dependence: Secondary | ICD-10-CM

## 2016-09-02 DIAGNOSIS — F419 Anxiety disorder, unspecified: Secondary | ICD-10-CM | POA: Diagnosis present

## 2016-09-02 DIAGNOSIS — Z9889 Other specified postprocedural states: Secondary | ICD-10-CM

## 2016-09-02 DIAGNOSIS — Z833 Family history of diabetes mellitus: Secondary | ICD-10-CM | POA: Diagnosis not present

## 2016-09-02 DIAGNOSIS — Z8249 Family history of ischemic heart disease and other diseases of the circulatory system: Secondary | ICD-10-CM | POA: Diagnosis not present

## 2016-09-02 HISTORY — DX: Secondary malignant neoplasm of bone: C79.51

## 2016-09-02 LAB — COMPREHENSIVE METABOLIC PANEL
ALBUMIN: 3.3 g/dL — AB (ref 3.5–5.0)
ALK PHOS: 66 U/L (ref 38–126)
ALT: 18 U/L (ref 17–63)
ANION GAP: 8 (ref 5–15)
AST: 14 U/L — ABNORMAL LOW (ref 15–41)
BILIRUBIN TOTAL: 0.6 mg/dL (ref 0.3–1.2)
BUN: 23 mg/dL — AB (ref 6–20)
CALCIUM: 8.9 mg/dL (ref 8.9–10.3)
CO2: 27 mmol/L (ref 22–32)
CREATININE: 0.87 mg/dL (ref 0.61–1.24)
Chloride: 99 mmol/L — ABNORMAL LOW (ref 101–111)
GFR calc Af Amer: >60 mL/min (ref >60)
GFR calc non Af Amer: >60 mL/min (ref >60)
GLUCOSE: 104 mg/dL — AB (ref 65–99)
Potassium: 4.3 mmol/L (ref 3.5–5.1)
Sodium: 134 mmol/L — ABNORMAL LOW (ref 135–145)
TOTAL PROTEIN: 7.3 g/dL (ref 6.5–8.1)

## 2016-09-02 LAB — CBC WITH DIFFERENTIAL/PLATELET
BASOS PCT: 0 %
Basophils Absolute: 0 10*3/uL (ref 0.0–0.1)
Eosinophils Absolute: 0.1 10*3/uL (ref 0.0–0.7)
Eosinophils Relative: 1 %
HEMATOCRIT: 33.7 % — AB (ref 39.0–52.0)
HEMOGLOBIN: 11.3 g/dL — AB (ref 13.0–17.0)
LYMPHS ABS: 0.8 10*3/uL (ref 0.7–4.0)
Lymphocytes Relative: 5 %
MCH: 31.8 pg (ref 26.0–34.0)
MCHC: 33.5 g/dL (ref 30.0–36.0)
MCV: 94.9 fL (ref 78.0–100.0)
MONOS PCT: 10 %
Monocytes Absolute: 1.5 10*3/uL — ABNORMAL HIGH (ref 0.1–1.0)
NEUTROS PCT: 84 %
Neutro Abs: 12.3 10*3/uL — ABNORMAL HIGH (ref 1.7–7.7)
Platelets: 419 10*3/uL — ABNORMAL HIGH (ref 150–400)
RBC: 3.55 MIL/uL — ABNORMAL LOW (ref 4.22–5.81)
RDW: 12 % (ref 11.5–15.5)
WBC: 14.7 10*3/uL — ABNORMAL HIGH (ref 4.0–10.5)

## 2016-09-02 LAB — TROPONIN I: Troponin I: 0.03 ng/mL (ref <0.03)

## 2016-09-02 LAB — I-STAT CG4 LACTIC ACID, ED: Lactic Acid, Venous: 1.55 mmol/L (ref 0.5–1.9)

## 2016-09-02 LAB — MRSA PCR SCREENING: MRSA BY PCR: NEGATIVE

## 2016-09-02 LAB — LIPASE, BLOOD: Lipase: 16 U/L (ref 11–51)

## 2016-09-02 MED ORDER — SODIUM CHLORIDE 0.9% FLUSH
3.0000 mL | Freq: Two times a day (BID) | INTRAVENOUS | Status: DC
  Administered 2016-09-02 – 2016-09-05 (×7): 3 mL via INTRAVENOUS

## 2016-09-02 MED ORDER — ACETAMINOPHEN 650 MG RE SUPP: 650.0000 mg | Freq: Four times a day (QID) | RECTAL | Status: DC | PRN

## 2016-09-02 MED ORDER — KETOROLAC TROMETHAMINE 30 MG/ML IJ SOLN
30.0000 mg | Freq: Once | INTRAMUSCULAR | Status: AC
  Administered 2016-09-02: 30 mg via INTRAVENOUS
  Filled 2016-09-02: qty 1

## 2016-09-02 MED ORDER — ENOXAPARIN SODIUM 40 MG/0.4ML ~~LOC~~ SOLN
40.0000 mg | SUBCUTANEOUS | Status: DC
  Administered 2016-09-02 – 2016-09-03 (×2): 40 mg via SUBCUTANEOUS
  Filled 2016-09-02 (×2): qty 0.4

## 2016-09-02 MED ORDER — VANCOMYCIN HCL IN DEXTROSE 1-5 GM/200ML-% IV SOLN
1000.0000 mg | Freq: Once | INTRAVENOUS | Status: AC
  Administered 2016-09-02: 1000 mg via INTRAVENOUS
  Filled 2016-09-02: qty 200

## 2016-09-02 MED ORDER — OXYCODONE HCL 5 MG PO TABS
5.0000 mg | ORAL_TABLET | ORAL | Status: DC | PRN
  Administered 2016-09-02: 5 mg via ORAL
  Filled 2016-09-02: qty 1

## 2016-09-02 MED ORDER — DEXTROSE 5 % IV SOLN
1.0000 g | INTRAVENOUS | Status: AC
  Administered 2016-09-02 – 2016-09-08 (×7): 1 g via INTRAVENOUS
  Filled 2016-09-02 (×7): qty 10

## 2016-09-02 MED ORDER — HYDROMORPHONE HCL 1 MG/ML IJ SOLN: 1.0000 mg | INTRAMUSCULAR | Status: DC | PRN

## 2016-09-02 MED ORDER — AZITHROMYCIN 500 MG PO TABS
500.0000 mg | ORAL_TABLET | ORAL | Status: DC
  Administered 2016-09-02 – 2016-09-05 (×4): 500 mg via ORAL
  Filled 2016-09-02 (×5): qty 1

## 2016-09-02 MED ORDER — SIMVASTATIN 20 MG PO TABS
20.0000 mg | ORAL_TABLET | Freq: Every day | ORAL | Status: DC
  Administered 2016-09-03 – 2016-09-10 (×7): 20 mg via ORAL
  Filled 2016-09-02 (×7): qty 1

## 2016-09-02 MED ORDER — PIPERACILLIN-TAZOBACTAM 3.375 G IVPB 30 MIN
3.3750 g | Freq: Once | INTRAVENOUS | Status: AC
  Administered 2016-09-02: 3.375 g via INTRAVENOUS
  Filled 2016-09-02 (×2): qty 50

## 2016-09-02 MED ORDER — FERROUS SULFATE 325 (65 FE) MG PO TABS
325.0000 mg | ORAL_TABLET | Freq: Every day | ORAL | Status: DC
  Administered 2016-09-03 – 2016-09-11 (×8): 325 mg via ORAL
  Filled 2016-09-02 (×8): qty 1

## 2016-09-02 MED ORDER — HYDROMORPHONE HCL 1 MG/ML IJ SOLN
1.0000 mg | INTRAMUSCULAR | Status: DC | PRN
  Administered 2016-09-03 – 2016-09-06 (×15): 1 mg via INTRAVENOUS
  Filled 2016-09-02 (×18): qty 1

## 2016-09-02 MED ORDER — ACETAMINOPHEN 325 MG PO TABS
650.0000 mg | ORAL_TABLET | Freq: Once | ORAL | Status: AC
  Administered 2016-09-02: 650 mg via ORAL

## 2016-09-02 MED ORDER — ACETAMINOPHEN 325 MG PO TABS
650.0000 mg | ORAL_TABLET | Freq: Four times a day (QID) | ORAL | Status: DC | PRN
  Administered 2016-09-04: 650 mg via ORAL
  Filled 2016-09-02: qty 2

## 2016-09-02 MED ORDER — SODIUM CHLORIDE 0.9 % IV SOLN
INTRAVENOUS | Status: AC
  Administered 2016-09-02: 20:00:00 via INTRAVENOUS

## 2016-09-02 MED ORDER — HYDROCODONE-HOMATROPINE 5-1.5 MG/5ML PO SYRP
5.0000 mL | ORAL_SOLUTION | Freq: Three times a day (TID) | ORAL | Status: DC | PRN
  Administered 2016-09-03 – 2016-09-04 (×2): 5 mL via ORAL
  Filled 2016-09-02 (×2): qty 5

## 2016-09-02 MED ORDER — HYDROMORPHONE HCL 1 MG/ML IJ SOLN
1.0000 mg | Freq: Once | INTRAMUSCULAR | Status: AC
  Administered 2016-09-02: 1 mg via INTRAVENOUS
  Filled 2016-09-02: qty 1

## 2016-09-02 MED ORDER — ACETAMINOPHEN 325 MG PO TABS
ORAL_TABLET | ORAL | Status: AC
  Administered 2016-09-02: 650 mg via ORAL
  Filled 2016-09-02: qty 2

## 2016-09-02 MED ORDER — BALSALAZIDE DISODIUM 750 MG PO CAPS
2250.0000 mg | ORAL_CAPSULE | Freq: Every day | ORAL | Status: DC
  Administered 2016-09-03 – 2016-09-05 (×3): 2250 mg via ORAL
  Filled 2016-09-02 (×3): qty 3

## 2016-09-02 NOTE — H&P (Signed)
History and Physical  Patient Name: [name]     [id number]    DOB: 1956/01/16    DOA: 09/02/2016 PCP: Marylene Land, MD   Patient coming from: Home  Chief Complaint: Chest pain, dyspnea  HPI: [name] is a 61 y.o. male with a past medical history significant for prostate cancer, metastatic to bone, stable, and ulcerative colitis who presents with cough, chest pain.  The patient was in his usual state of health until about 5 Touhey ago when he developed a "slight case of the flu", followed by a "lingering cough". In the last week, he is noticing he is MUCH more tired than usual, had an increased cough, dyspnea on exertion, night sweats once, and vague malaise.  Yesterday morning, he woke up with severe left flank/rib pain, almost making it difficult to breathe, so he went to the emergency room. There he had a slight leukocytosis, and a CT angiogram of the chest showed no PE with small left effusion and infiltrate. He was discharged with Levaquin for pneumonia and Vicodin for pain.  The Vicodin helped somewhat, but today he had new pain across his back and in his chest, moderate in intensity, worse with inspiration or laying back, and so he returned to the ER.  ED course: -Temp 71F, heart rate 78, respirations 20-30, BP 120/75, Pulse ox 94-99% on 2L [place] -Na 134, K 4.3, Cr 0.87, WBC 14.9K (from 11.6K yesterday), Hgb 11.3 -Troponin 0.03, ECG RSR' pattern, no change from previous, no ST changes -Lipase normal -Lactate 1.55 -CXR showed increased size of left effusion -He was given vancomycin and Zosyn and TRH were asked to evaluate for admission for pneumonia with parapneumonic effusion vs empyema    The patient follows at [place] for his UC ([facility]) and [facility] for his prostate cancer (diagnosed in 2016, has mets to rib, but has been stable on hormone therapy for >1 year now).    ROS: Review of Systems  Constitutional: Positive for diaphoresis and malaise/fatigue. Negative for  chills and fever.  Respiratory: Positive for cough and shortness of breath. Negative for hemoptysis and sputum production.   Cardiovascular: Positive for chest pain and orthopnea. Negative for leg swelling.  Musculoskeletal: Positive for back pain.  Neurological: Positive for weakness.  All other systems reviewed and are negative.         Past Medical History:  Diagnosis Date  . Allergy   . Anxiety   . Arthritis   . Hyperlipidemia   . Metastatic cancer to bone ([place])    left rib.  [place] Neoplasm of face    benign  . Prostate cancer ([place])   . Sleep apnea   . Ulcerative colitis     Past Surgical History:  Procedure Laterality Date  . INGUINAL HERNIA REPAIR    . PROSTATECTOMY  04/2015  . SHOULDER ARTHROTOMY Right     Social History: Patient lives with his dog.  The patient walks unassisted.  He runs a flooring business, still works.  He is from [place] originally.  He is a remote former smoker.    Allergies  Allergen Reactions  . Sulfamethoxazole     REACTION: unspecified    Family history: family history includes Heart attack in his father; Juvenile idiopathic arthritis in his daughter; Raynaud syndrome in his daughter.  Prior to Admission medications   Medication Sig Start Date End Date Taking? Authorizing Provider  balsalazide (COLAZAL) 750 MG capsule Take 2,250 mg by mouth daily.   Yes  Historical Provider, MD  Cholecalciferol (VITAMIN D) 2000 UNITS tablet Take 4,000 Units by mouth daily.   Yes Historical Provider, MD  Cyanocobalamin (VITAMIN B 12 PO) Take by mouth.   Yes Historical Provider, MD  ferrous sulfate 325 (65 FE) MG tablet Take 325 mg by mouth daily with breakfast.   Yes Historical Provider, MD  fish oil-omega-3 fatty acids 1000 MG capsule Take 2 g by mouth daily.     Yes Historical Provider, MD  glucosamine-chondroitin 500-400 MG tablet Take 1 tablet by mouth 3 (three) times daily.     Yes Historical Provider, MD  HYDROcodone-acetaminophen (NORCO/VICODIN)  5-325 MG tablet Take 1 tablet by mouth every 4 (four) hours as needed. 09/01/16  Yes Tanna Furry, MD  levofloxacin (LEVAQUIN) 500 MG tablet Take 1 tablet (500 mg total) by mouth daily. 09/01/16  Yes Tanna Furry, MD  Lycopene 10 MG CAPS Take 10 mg by mouth.   Yes Historical Provider, MD  Multiple Vitamin (MULTIVITAMIN) tablet Take 1 tablet by mouth daily.     Yes Historical Provider, MD  naproxen (NAPROSYN) 500 MG tablet Take 1 tablet (500 mg total) by mouth 2 (two) times daily. 09/01/16  Yes Tanna Furry, MD  valACYclovir (VALTREX) 500 MG tablet Take 500 mg by mouth 2 (two) times daily. PRN   Yes Historical Provider, MD  HYDROcodone-homatropine (HYCODAN) 5-1.5 MG/5ML syrup Take 5 mLs by mouth every 8 (eight) hours as needed. 08/01/16   Dorena Cookey, MD  vitamin E 200 UNIT capsule Take 200 Units by mouth daily.    Historical Provider, MD       Physical Exam: BP 138/80 (BP Location: Right Arm)   Pulse 78   Temp 99 F (37.2 C) (Oral)   Resp 24   Ht 5\' 6"  (1.676 m)   Wt 79.8 kg (175 lb 14.8 oz)   SpO2 98%   BMI 28.40 kg/m  General appearance: Well-developed, adult male, alert and in no acute distress.   Eyes: Anicteric, conjunctiva pink, lids and lashes normal. PERRL.    ENT: No nasal deformity, discharge, epistaxis.  Hearing normal. OP moist without lesions.   Neck: No neck masses.  Trachea midline.  No thyromegaly/tenderness. Lymph: No cervical or supraclavicular lymphadenopathy. Skin: Warm and dry.  No jaundice.  No suspicious rashes or lesions. Cardiac: RRR, nl S1-S2, no murmurs appreciated.  Capillary refill is brisk.  JVP normal.  No LE edema.  Radial and DP pulses 2+ and symmetric. Respiratory: Respiratory rate fast and shallow.  No wheezes.  No sounds on left, no rales on right. Abdomen: Abdomen soft.  No TTP. No ascites, distension, hepatosplenomegaly.   MSK: No deformities or effusions.  No cyanosis or clubbing. Neuro: Cranial nerves 3-12 intact.  Sensation intact to light touch.  Speech is fluent.  Muscle strength 5/5 and symmetric.    Psych: Sensorium intact and responding to questions, attention normal.  Behavior appropriate.  Affect normal.  Judgment and insight appear normal.     Labs on Admission:  I have personally reviewed following labs and imaging studies: CBC:  Recent Labs Lab 09/01/16 0650 09/02/16 1305  WBC 11.6* 14.7*  NEUTROABS 9.6* 12.3*  HGB 12.1* 11.3*  HCT 36.2* 33.7*  MCV 95.0 94.9  PLT 417* [id number]*   Basic Metabolic Panel:  Recent Labs Lab 09/01/16 0650 09/02/16 1305  NA 137 134*  K 4.1 4.3  CL 103 99*  CO2 26 27  GLUCOSE 128* 104*  BUN 25* 23*  CREATININE 1.05 0.87  CALCIUM 8.9 8.9   GFR: Estimated Creatinine Clearance: 89.7 mL/min (by C-G formula based on SCr of 0.87 mg/dL).  Liver Function Tests:  Recent Labs Lab 09/02/16 1305  AST 14*  ALT 18  ALKPHOS 66  BILITOT 0.6  PROT 7.3  ALBUMIN 3.3*    Recent Labs Lab 09/02/16 1305  LIPASE 16   No results for input(s): AMMONIA in the last 168 hours. Coagulation Profile: No results for input(s): INR, PROTIME in the last 168 hours. Cardiac Enzymes:  Recent Labs Lab 09/02/16 1305  TROPONINI 0.03*   BNP (last 3 results) No results for input(s): PROBNP in the last 8760 hours. HbA1C: No results for input(s): HGBA1C in the last 72 hours. CBG: No results for input(s): GLUCAP in the last 168 hours. Lipid Profile: No results for input(s): CHOL, HDL, LDLCALC, TRIG, CHOLHDL, LDLDIRECT in the last 72 hours. Thyroid Function Tests: No results for input(s): TSH, T4TOTAL, FREET4, T3FREE, THYROIDAB in the last 72 hours. Anemia Panel: No results for input(s): VITAMINB12, FOLATE, FERRITIN, TIBC, IRON, RETICCTPCT in the last 72 hours. Sepsis Labs: Lactic acid 1.55 Invalid input(s): PROCALCITONIN, LACTICIDVEN No results found for this or any previous visit (from the past 240 hour(s)).       Radiological Exams on Admission: Personally reviewed CXR, CTA and CXR from  yesterday: Dg Chest 2 View  Result Date: 09/02/2016 CLINICAL DATA:  Left-sided chest pain.  Pneumonia. EXAM: CHEST  2 VIEW COMPARISON:  Two-view chest x-ray and CT the chest 2/24/ 18. FINDINGS: Heart size is upper limits of normal. A left pleural effusion has progressed. Increasing left lower lobe airspace disease is noted. The right lung remains clear. IMPRESSION: 1. Progressive left lower lobe pneumonia with increasing pleural fluid. Electronically Signed   By: San Morelle M.D.   On: 09/02/2016 14:46   Dg Chest 2 View  Result Date: 09/01/2016 CLINICAL DATA:  Initial evaluation for acute shortness of breath. EXAM: CHEST  2 VIEW COMPARISON:  Prior radiograph from 08/28/2016. FINDINGS: Mild cardiomegaly, stable. Mediastinal silhouette within normal limits. Lungs are hypoinflated. Patchy left basilar opacity, which may reflect atelectasis and/or infiltrates. No other focal airspace disease. No pulmonary edema or pleural effusion. No pneumothorax. No acute osseous abnormality. IMPRESSION: Shallow lung inflation with patchy left basilar opacity, which may reflect atelectasis and/ or infiltrate. Electronically Signed   By: Jeannine Boga M.D.   On: 09/01/2016 06:50   Ct Angio Chest Pe W Or Wo Contrast  Result Date: 09/01/2016 CLINICAL DATA:  Shortness of breath and left chest pain. History of metastatic prostate carcinoma. EXAM: CT ANGIOGRAPHY CHEST WITH CONTRAST TECHNIQUE: Multidetector CT imaging of the chest was performed using the standard protocol during bolus administration of intravenous contrast. Multiplanar CT image reconstructions and MIPs were obtained to evaluate the vascular anatomy. CONTRAST:  100 cc Isovue 370. COMPARISON:  MRI chest 08/23/2014. PA and lateral chest this same day and 08/28/2016. FINDINGS: Cardiovascular: No pulmonary embolus is identified. Heart size is mildly enlarged. No pericardial effusion. Mediastinum/Nodes: No enlarged mediastinal, hilar, or axillary lymph  nodes. Thyroid gland, trachea, and esophagus demonstrate no significant findings. Lungs/Pleura: The patient has small left pleural effusion. No right pleural effusion is identified. Left lower lobe airspace disease has an appearance most worrisome for pneumonia. Mild dependent airspace disease on the right is most in keeping with atelectasis. No nodule or mass is seen. Upper Abdomen: Small amount of perisplenic fluid is seen. 0.5 cm in diameter hypoattenuating lesion in the left hepatic lobe is most consistent with  a cyst and unchanged. Musculoskeletal: Sclerotic lesion in the posterior arc of the left sixth rib consistent with metastatic disease is identified as seen on the prior MRI. Small densely sclerotic lesion in a mid thoracic vertebral body has appearance most suggestive of a bone island. Review of the MIP images confirms the above findings. IMPRESSION: Negative for pulmonary embolus. Left lower lobe airspace disease and small effusion most consistent with pneumonia. 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 Electronically Signed   By: Inge Rise M.D.   On: 09/01/2016 08:45    EKG: Independently reviewed. Rate 79, QTc normal, RSR', no ST changes, LVH present.         Assessment/Plan  1. Community-acquired pneumonia with effusion:  Suspect effusion is parapneumonic.   Other considerations include empyema, CHF.  Doubt malignant effusion.    -Ceftriaxone and azithromycin -Sputum culture if able -Legionella and strep pneumo urinary antigens -Acetaminophen, Oxycodone, or Dilaudid for pain -Nothing by mouth in a.m. and US thoracentesis is ordered     -Check Gram stain, culture, cell count, LDH, protein     -Therapeutic and diagnostic tap ordered    2. Anemia:  Normocytic.  No recent symptoms of bleeding. -Check ferritin, iron stores -Check B12, folate, TSH -Continue iron suppl  3. Elevated troponin:  Unclear significance of  minimally elevated troponin.  Doubt ACS.  CHF with transudative effusion is theoretically within the differential. -Trend troponin  4. Prostate cancer:  Recently finished Casodex, finasteride, Firmagron treatments, has follow up with [facility].  5. Ulcerative colitis:  -Continue Colazal  6. Other medications:  -Continue simvastatin      DVT prophylaxis: SCDs now, start Lovenox after thora  Code Status: FULL  Family Communication: Daughter and son at bedside  Disposition Plan: Anticipate thoracentesis tomorrow,  And if parapneumonic, if symptoms improve and effusion does not quickly recur, possibly home Tuesday Consults called: None Admission status: INPATIENT        Medical decision making: Patient seen at 7:30 PM on 09/02/2016.  What exists of the patient's chart was reviewed in depth  And outside records were reviewed and summarized above.  Clinical condition: stable.        Edwin Dada [facility] Pager [phone]        At the time of admission, it appears that the appropriate admission status for this patient is INPATIENT. This is judged to be reasonable and necessary in order to provide the required intensity of service to ensure the patient's safety given the presenting symptoms, physical exam findings, and initial radiographic and laboratory data in the context of their chronic comorbidities.  Together, these circumstances are felt to place him at high risk for further clinical deterioration threatening life, limb, or organ.   Patient requires inpatient status due to high intensity of service, high risk for further deterioration and high frequency of surveillance required because of this acute illness that poses a threat to life, limb or bodily function.    I certify that at the point of admission it is my clinical judgment that the patient will require inpatient hospital care spanning beyond 2 midnights from the point of admission  and that early discharge would result in unnecessary risk of decompensation and readmission or threat to life, limb or bodily function.

## 2016-09-02 NOTE — ED Notes (Signed)
Patient denies pain and is resting comfortably.  

## 2016-09-02 NOTE — ED Notes (Signed)
Attempted to call report to receiving nurse Clarise Cruz , unable to take call. Will return call for report. Mathew at [facility] informed

## 2016-09-02 NOTE — ED Notes (Signed)
Diagnosed yesterday pneumonia. CP started 1100 today

## 2016-09-02 NOTE — ED Notes (Signed)
Critical results given to Dr. Johnney Killian, troponin .03

## 2016-09-02 NOTE — ED Provider Notes (Signed)
[facility] Provider Note   CSN: [id number] Arrival date & time: 09/02/16  1146     History   Chief Complaint Chief Complaint  Patient presents with  . Chest Pain    HPI TECUMSEH JAVADI is a 61 y.o. male.  HPI Patient was seen yesterday and had CT scan done. A metastatic lesion was identified to his left rib as well as a suspicion for pneumonia and pleural effusion. Patient was started on Levaquin and Vicodin for pain control. Patient reports initially the Vicodin was helpful for pain control. Pain however has worsened and expanded. Now he has pain in the left lateral chest and the front of the chest. Much worse with movements or deep breath or cough. Past Medical History:  Diagnosis Date  . Allergy   . Anxiety   . Arthritis   . Hyperlipidemia   . 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  Marland Kitchen Neoplasm of face    benign  . Prostate cancer ([place])   . Sleep apnea   . Ulcerative colitis     Patient Active Problem List   Diagnosis Date Noted  . Pneumonia 09/02/2016  . Viral URI with cough 08/01/2016  . Rectal bleeding 05/31/2015  . Ulcerative colitis with rectal bleeding ([place]) 05/31/2015  . Cellulitis and abscess of trunk 04/20/2014  . Prostate cancer ([place]) 03/16/2014  . Cutaneous fungal infection 08/20/2013  . Boil, ear 05/12/2013  . PSA elevation 10/21/2012  . Urticaria due to food allergy 01/02/2012  . Hearing loss sensory, bilateral 07/17/2011  . Herpes simplex 09/05/2010  . BENIGN NEOPLASM SKIN OTHER&UNSPEC PARTS FACE 09/06/2008  . OBSTRUCTIVE SLEEP APNEA 08/29/2007  . FAMILIAL COMBINED HYPERLIPIDEMIA 07/08/2007  . ULCERATIVE COLITIS 01/23/2007    Past Surgical History:  Procedure Laterality Date  . INGUINAL HERNIA REPAIR    . PROSTATECTOMY  04/2015  . SHOULDER ARTHROTOMY Right        Home Medications    Prior to Admission medications   Medication Sig Start Date End Date Taking? Authorizing Provider  balsalazide (COLAZAL) 750 MG capsule  Take 2,250 mg by mouth daily.   Yes Historical Provider, MD  Cholecalciferol (VITAMIN D) 2000 UNITS tablet Take 4,000 Units by mouth daily.   Yes Historical Provider, MD  Cyanocobalamin (VITAMIN B 12 PO) Take by mouth.   Yes Historical Provider, MD  ferrous sulfate 325 (65 FE) MG tablet Take 325 mg by mouth daily with breakfast.   Yes Historical Provider, MD  fish oil-omega-3 fatty acids 1000 MG capsule Take 2 g by mouth daily.     Yes Historical Provider, MD  glucosamine-chondroitin 500-400 MG tablet Take 1 tablet by mouth 3 (three) times daily.     Yes Historical Provider, MD  HYDROcodone-acetaminophen (NORCO/VICODIN) 5-325 MG tablet Take 1 tablet by mouth every 4 (four) hours as needed. 09/01/16  Yes Tanna Furry, MD  levofloxacin (LEVAQUIN) 500 MG tablet Take 1 tablet (500 mg total) by mouth daily. 09/01/16  Yes Tanna Furry, MD  Lycopene 10 MG CAPS Take 10 mg by mouth.   Yes Historical Provider, MD  Multiple Vitamin (MULTIVITAMIN) tablet Take 1 tablet by mouth daily.     Yes Historical Provider, MD  naproxen (NAPROSYN) 500 MG tablet Take 1 tablet (500 mg total) by mouth 2 (two) times daily. 09/01/16  Yes Tanna Furry, MD  valACYclovir (VALTREX) 500 MG tablet Take 500 mg by mouth 2 (two) times daily. PRN   Yes Historical Provider, MD  HYDROcodone-homatropine [facility])  5-1.5 MG/5ML syrup Take 5 mLs by mouth every 8 (eight) hours as needed. 08/01/16   Dorena Cookey, MD  vitamin E 200 UNIT capsule Take 200 Units by mouth daily.    Historical Provider, MD    Family History Family History  Problem Relation Age of Onset  . Heart attack Father   . Hyperlipidemia    . Diabetes    . Thyroid disease    . Lung cancer    . Breast cancer    . Colon cancer      Social History Social History  Substance Use Topics  . Smoking status: Former Research scientist (life sciences)  . Smokeless tobacco: Never Used  . Alcohol use 0.0 oz/week     Allergies   Sulfamethoxazole   Review of Systems Review of Systems 10 Systems reviewed  and are negative for acute change except as noted in the HPI.   Physical Exam Updated Vital Signs BP 109/59   Pulse 77   Temp 98.7 F (37.1 C) (Oral)   Resp 23   SpO2 94%   Physical Exam  Constitutional: He is oriented to person, place, and time. He appears well-developed and well-nourished.  Patient appears to be in moderate pain with deep breath or movement. He is alert and nontoxic. No respiratory distress at rest.  HENT:  Head: Normocephalic and atraumatic.  Eyes: EOM are normal.  Cardiovascular: Normal rate, regular rhythm, normal heart sounds and intact distal pulses.   Pulmonary/Chest:  Decreased breath sounds left lateral lung field. Significant pain with movement or deep inspiration.  Abdominal: Soft. He exhibits no distension. There is no tenderness. There is no guarding.  Musculoskeletal: Normal range of motion. He exhibits no edema or tenderness.  Neurological: He is alert and oriented to person, place, and time. No cranial nerve deficit. He exhibits normal muscle tone. Coordination normal.  Skin: Skin is warm and dry.  Psychiatric: He has a normal mood and affect.     ED Treatments / Results  Labs (all labs ordered are listed, but only abnormal results are displayed) Labs Reviewed  CBC WITH DIFFERENTIAL/PLATELET - Abnormal; Notable for the following:       Result Value   WBC 14.7 (*)    RBC 3.55 (*)    Hemoglobin 11.3 (*)    HCT 33.7 (*)    Platelets 419 (*)    Neutro Abs 12.3 (*)    Monocytes Absolute 1.5 (*)    All other components within normal limits  COMPREHENSIVE METABOLIC PANEL - Abnormal; Notable for the following:    Sodium 134 (*)    Chloride 99 (*)    Glucose, Bld 104 (*)    BUN 23 (*)    Albumin 3.3 (*)    AST 14 (*)    All other components within normal limits  TROPONIN I - Abnormal; Notable for the following:    Troponin I 0.03 (*)    All other components within normal limits  LIPASE, BLOOD  I-STAT CG4 LACTIC ACID, ED    EKG  EKG  Interpretation None       Radiology Dg Chest 2 View  Result Date: 09/02/2016 CLINICAL DATA:  Left-sided chest pain.  Pneumonia. EXAM: CHEST  2 VIEW COMPARISON:  Two-view chest x-ray and CT the chest 2/24/ 18. FINDINGS: Heart size is upper limits of normal. A left pleural effusion has progressed. Increasing left lower lobe airspace disease is noted. The right lung remains clear. IMPRESSION: 1. Progressive left lower lobe pneumonia with increasing  pleural fluid. Electronically Signed   By: San Morelle M.D.   On: 09/02/2016 14:46   Dg Chest 2 View  Result Date: 09/01/2016 CLINICAL DATA:  Initial evaluation for acute shortness of breath. EXAM: CHEST  2 VIEW COMPARISON:  Prior radiograph from 08/28/2016. FINDINGS: Mild cardiomegaly, stable. Mediastinal silhouette within normal limits. Lungs are hypoinflated. Patchy left basilar opacity, which may reflect atelectasis and/or infiltrates. No other focal airspace disease. No pulmonary edema or pleural effusion. No pneumothorax. No acute osseous abnormality. IMPRESSION: Shallow lung inflation with patchy left basilar opacity, which may reflect atelectasis and/ or infiltrate. Electronically Signed   By: Jeannine Boga M.D.   On: 09/01/2016 06:50   Ct Angio Chest Pe W Or Wo Contrast  Result Date: 09/01/2016 CLINICAL DATA:  Shortness of breath and left chest pain. History of metastatic prostate carcinoma. EXAM: CT ANGIOGRAPHY CHEST WITH CONTRAST TECHNIQUE: Multidetector CT imaging of the chest was performed using the standard protocol during bolus administration of intravenous contrast. Multiplanar CT image reconstructions and MIPs were obtained to evaluate the vascular anatomy. CONTRAST:  100 cc Isovue 370. COMPARISON:  MRI chest 08/23/2014. PA and lateral chest this same day and 08/28/2016. FINDINGS: Cardiovascular: No pulmonary embolus is identified. Heart size is mildly enlarged. No pericardial effusion. Mediastinum/Nodes: No enlarged  mediastinal, hilar, or axillary lymph nodes. Thyroid gland, trachea, and esophagus demonstrate no significant findings. Lungs/Pleura: The patient has small left pleural effusion. No right pleural effusion is identified. Left lower lobe airspace disease has an appearance most worrisome for pneumonia. Mild dependent airspace disease on the right is most in keeping with atelectasis. No nodule or mass is seen. Upper Abdomen: Small amount of perisplenic fluid is seen. 0.5 cm in diameter hypoattenuating lesion in the left hepatic lobe is most consistent with a cyst and unchanged. Musculoskeletal: Sclerotic lesion in the posterior arc of the left sixth rib consistent with metastatic disease is identified as seen on the prior MRI. Small densely sclerotic lesion in a mid thoracic vertebral body has appearance most suggestive of a bone island. Review of the MIP images confirms the above findings. IMPRESSION: Negative for pulmonary embolus. Left lower lobe airspace disease and small effusion most consistent with pneumonia. 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 Electronically Signed   By: Inge Rise M.D.   On: 09/01/2016 08:45    Procedures Procedures (including critical care time)  Medications Ordered in ED Medications  vancomycin (VANCOCIN) IVPB 1000 mg/200 mL premix (1,000 mg Intravenous New Bag/Given 09/02/16 1510)  HYDROmorphone (DILAUDID) injection 1 mg (1 mg Intravenous Given 09/02/16 1428)  piperacillin-tazobactam (ZOSYN) IVPB 3.375 g (0 g Intravenous Stopped 09/02/16 1508)  ketorolac (TORADOL) 30 MG/ML injection 30 mg (30 mg Intravenous Given 09/02/16 1428)     Initial Impression / Assessment and Plan / ED Course  I have reviewed the triage vital signs and the nursing notes.  Pertinent labs & imaging results that were available during my care of the patient were reviewed by me and considered in my medical decision making (see chart for  details).      Final Clinical Impressions(s) / ED Diagnoses   Final diagnoses:  Community acquired pneumonia of left lower lobe of lung ([place])  Empyema lung ([place])  Prostate cancer metastatic to bone [facility])   Patient has had worsening of symptoms since his evaluation yesterday. Treatment was initiated for pneumonia with pleural effusion. Patient's pain has expanded to encompass his anterior chest and radiated up  his thorax. White count has elevated and chest x-ray shows increasing volume of effusion. At this time, concern is for possible empyema or rapidly progressing pleural effusion with pneumonia. Patient be started on IV antibiotics and treated for pain. Plan will be for admission. New Prescriptions New Prescriptions   No medications on file     Charlesetta Shanks, MD 09/02/16 1558

## 2016-09-02 NOTE — ED Notes (Addendum)
ED Provider at bedside. Pt on monitor and auto VS.

## 2016-09-02 NOTE — ED Triage Notes (Signed)
Patient states around 1000 today he had some twinges of pain in the left chest and at 1100 had a sharp pain in the left chest.  Patient was diagnosed with left lung pneumonia yesterday.  Denies other symptoms at this time.

## 2016-09-02 NOTE — Progress Notes (Addendum)
[facility] ED requesting admission for 61 yo male with prostate cancer (gleason 4+5=9 on biopsy 04/14/2014 [facility])  w stable rib metastasis w CT scan => left lower lung infiltrate with small left pleural effusion c/o chest pain left chest

## 2016-09-03 ENCOUNTER — Inpatient Hospital Stay (HOSPITAL_COMMUNITY): Payer: BLUE CROSS/BLUE SHIELD

## 2016-09-03 LAB — FERRITIN: FERRITIN: 764 ng/mL — AB (ref 24–336)

## 2016-09-03 LAB — COMPREHENSIVE METABOLIC PANEL
ALBUMIN: 3 g/dL — AB (ref 3.5–5.0)
ALK PHOS: 91 U/L (ref 38–126)
ALT: 27 U/L (ref 17–63)
AST: 24 U/L (ref 15–41)
Anion gap: 10 (ref 5–15)
BUN: 17 mg/dL (ref 6–20)
CALCIUM: 8.7 mg/dL — AB (ref 8.9–10.3)
CO2: 26 mmol/L (ref 22–32)
CREATININE: 0.88 mg/dL (ref 0.61–1.24)
Chloride: 100 mmol/L — ABNORMAL LOW (ref 101–111)
GFR calc Af Amer: >60 mL/min (ref >60)
GFR calc non Af Amer: >60 mL/min (ref >60)
Glucose, Bld: 138 mg/dL — ABNORMAL HIGH (ref 65–99)
Potassium: 4 mmol/L (ref 3.5–5.1)
SODIUM: 136 mmol/L (ref 135–145)
Total Bilirubin: 0.3 mg/dL (ref 0.3–1.2)
Total Protein: 6.6 g/dL (ref 6.5–8.1)

## 2016-09-03 LAB — TSH: TSH: 1.704 u[IU]/mL (ref 0.350–4.500)

## 2016-09-03 LAB — IRON AND TIBC
Iron: 13 ug/dL — ABNORMAL LOW (ref 45–182)
Saturation Ratios: 6 % — ABNORMAL LOW (ref 17.9–39.5)
TIBC: 203 ug/dL — ABNORMAL LOW (ref 250–450)
UIBC: 190 ug/dL

## 2016-09-03 LAB — CBC
HCT: 34.9 % — ABNORMAL LOW (ref 39.0–52.0)
HEMOGLOBIN: 11.6 g/dL — AB (ref 13.0–17.0)
MCH: 31 pg (ref 26.0–34.0)
MCHC: 33.2 g/dL (ref 30.0–36.0)
MCV: 93.3 fL (ref 78.0–100.0)
PLATELETS: 412 10*3/uL — AB (ref 150–400)
RBC: 3.74 MIL/uL — ABNORMAL LOW (ref 4.22–5.81)
RDW: 12.5 % (ref 11.5–15.5)
WBC: 15.3 10*3/uL — ABNORMAL HIGH (ref 4.0–10.5)

## 2016-09-03 LAB — HIV ANTIBODY (ROUTINE TESTING W REFLEX): HIV Screen 4th Generation wRfx: NONREACTIVE

## 2016-09-03 LAB — VITAMIN B12: Vitamin B-12: 777 pg/mL (ref 180–914)

## 2016-09-03 MED ORDER — PNEUMOCOCCAL VAC POLYVALENT 25 MCG/0.5ML IJ INJ
0.5000 mL | INJECTION | INTRAMUSCULAR | Status: AC
  Administered 2016-09-04: 0.5 mL via INTRAMUSCULAR
  Filled 2016-09-03: qty 0.5

## 2016-09-03 MED ORDER — IPRATROPIUM-ALBUTEROL 0.5-2.5 (3) MG/3ML IN SOLN
3.0000 mL | RESPIRATORY_TRACT | Status: DC | PRN
  Administered 2016-09-03: 3 mL via RESPIRATORY_TRACT
  Filled 2016-09-03: qty 3

## 2016-09-03 MED ORDER — LIDOCAINE HCL (PF) 1 % IJ SOLN
INTRAMUSCULAR | Status: AC
  Filled 2016-09-03: qty 10

## 2016-09-03 MED ORDER — HYDROCODONE-ACETAMINOPHEN 5-325 MG PO TABS
1.0000 | ORAL_TABLET | ORAL | Status: DC | PRN
  Administered 2016-09-03 – 2016-09-04 (×2): 1 via ORAL
  Filled 2016-09-03 (×2): qty 1

## 2016-09-03 MED ORDER — GUAIFENESIN ER 600 MG PO TB12
1200.0000 mg | ORAL_TABLET | Freq: Two times a day (BID) | ORAL | Status: DC | PRN
  Filled 2016-09-03: qty 2

## 2016-09-03 MED ORDER — POLYETHYLENE GLYCOL 3350 17 G PO PACK: 17.0000 g | PACK | Freq: Every day | ORAL | Status: DC | PRN

## 2016-09-03 NOTE — Clinical Social Work Note (Signed)
CSW acknowledges consult for home care services. Will notify RNCM.  CSW signing off. Consult again if any social work needs arise.  Dayton Scrape, [place]

## 2016-09-03 NOTE — Evaluation (Signed)
Occupational Therapy Evaluation Patient Details Name: [name] MRN: [id number] DOB: [birth year] Today's Date: 09/03/2016    History of Present Illness Pt admitted with CAP and L pleural effusion. PMH: prostate cancer with bone mets to ribs, ulcerative colitis.   Clinical Impression   Pt was independent prior to admission. Presents with poor activity tolerance, L rib pain, R knee buckling and decreased activity tolerance. Pt with 02 sat of 90% at rest on 2L, Huang remained on 02 throughout session. Pt unable to tolerate standing to wash hands following toileting. Educated pt in purse lip breathing and use of incentive spirometer. Will follow acutely.    Follow Up Recommendations  Home health OT;Supervision/Assistance - 24 hour    Equipment Recommendations  3 in 1 bedside commode    Recommendations for Other Services       Precautions / Restrictions Precautions Precautions: Fall Precaution Comments: watch 02, R knee buckles Restrictions Weight Bearing Restrictions: No      Mobility Bed Mobility Overal bed mobility: Needs Assistance Bed Mobility: Supine to Sit     Supine to sit: Supervision     General bed mobility comments: supervision for safety  Transfers Overall transfer level: Needs assistance Equipment used: 1 person hand held assist Transfers: Sit to/from Stand Sit to Stand: Min assist         General transfer comment: assist to steady, R knee buckles    Balance Overall balance assessment: Needs assistance Sitting-balance support: Feet supported Sitting balance-Leahy Scale: Good       Standing balance-Leahy Scale: Poor                              ADL Overall ADL's : Needs assistance/impaired Eating/Feeding: Independent;Sitting   Grooming: Wash/dry hands;Sitting;Set up   Upper Body Bathing: Set up;Sitting   Lower Body Bathing: Minimal assistance;Sit to/from stand   Upper Body Dressing : Set up;Sitting   Lower Body Dressing:  Minimal assistance;Sit to/from stand Lower Body Dressing Details (indicate cue type and reason): donned socks in long sitting Toilet Transfer: Minimal assistance;Ambulation;Comfort height toilet;Grab bars   Toileting- Clothing Manipulation and Hygiene: Sit to/from stand;Min guard       Functional mobility during ADLs: Minimal assistance General ADL Comments: Pt with 02 sat at 90% on RA, replaced 2L for ambulation to bathroom and sink. Instructed in pursed lip breathing and use of incentive spirometer. Educated in benefits of OOB.     Vision Patient Visual Report: No change from baseline       Perception     Praxis      Pertinent Vitals/Pain Pain Assessment: 0-10 Pain Score: 9  Pain Location: L ribs Pain Descriptors / Indicators: Aching Pain Intervention(s): Monitored during session;Limited activity within patient's tolerance;Repositioned     Hand Dominance Right   Extremity/Trunk Assessment Upper Extremity Assessment Upper Extremity Assessment: Overall WFL for tasks assessed   Lower Extremity Assessment Lower Extremity Assessment: Defer to PT evaluation   Cervical / Trunk Assessment Cervical / Trunk Assessment: Other exceptions (L rib pain from mets)   Communication Communication Communication: No difficulties   Cognition Arousal/Alertness: Awake/alert Behavior During Therapy: WFL for tasks assessed/performed Overall Cognitive Status: Within Functional Limits for tasks assessed                     General Comments       Exercises       Shoulder Instructions      Home  Living Family/patient expects to be discharged to:: Private residence Living Arrangements: Alone Available Help at Discharge: Friend(s);Available PRN/intermittently Type of Home: House Home Access: Stairs to enter Entrance Stairs-Number of Steps: 1   Home Layout: Two level;Able to live on main level with bedroom/bathroom     Bathroom Shower/Tub: Radiographer, therapeutic: Standard     Home Equipment: Cane - single point          Prior Functioning/Environment Level of Independence: Independent        Comments: runs 2 businesses        OT Problem List: Decreased strength;Decreased activity tolerance;Impaired balance (sitting and/or standing);Pain;Decreased knowledge of use of DME or AE;Cardiopulmonary status limiting activity      OT Treatment/Interventions: Self-care/ADL training;Energy conservation;DME and/or AE instruction;Therapeutic activities;Patient/family education    OT Goals(Current goals can be found in the care plan section) Acute Rehab OT Goals Patient Stated Goal: to return home to his dog OT Goal Formulation: With patient Time For Goal Achievement: 09/17/16 Potential to Achieve Goals: Good ADL Goals Pt Will Perform Grooming: with supervision;standing (2 activities) Pt Will Perform Lower Body Bathing: with supervision;sit to/from stand Pt Will Perform Lower Body Dressing: with supervision;sit to/from stand Pt Will Transfer to Toilet: with supervision;ambulating;regular height toilet Pt Will Perform Toileting - Clothing Manipulation and hygiene: with supervision;sit to/from stand Pt Will Perform Tub/Shower Transfer: Shower transfer;ambulating;3 in 1;with supervision Additional ADL Goal #1: Pt will state at least 3 energy conservation strategies and utilize purse lip breathing during ADL and mobility.  OT Frequency: Min 2X/week   Barriers to D/C: Decreased caregiver support          Co-evaluation              End of Session Equipment Utilized During Treatment: Gait belt Nurse Communication: Mobility status  Activity Tolerance: Patient limited by fatigue;Patient limited by pain Patient left: in chair;with call bell/phone within reach  OT Visit Diagnosis: Unsteadiness on feet (R26.81);Muscle weakness (generalized) (M62.81);Pain Pain - Right/Left: Left Pain - part of body:  (ribs)                ADL either  performed or assessed with clinical judgement  Time: 1433-1500 OT Time Calculation (min): 27 min Charges:  OT General Charges $OT Visit: 1 Procedure OT Evaluation $OT Eval Moderate Complexity: 1 Procedure G-Codes:      [name] 09/03/2016, 3:50 PM  [phone]

## 2016-09-03 NOTE — Progress Notes (Signed)
Report called to [facility] on 6E. Patient going to rm 01. Will transport patient after he finishes eating his lunch.

## 2016-09-03 NOTE — Progress Notes (Signed)
Thoracentesis not performed.  Please see dictation for further details.  [name] E 10:33 AM 09/03/2016

## 2016-09-03 NOTE — Evaluation (Signed)
Physical Therapy Evaluation Patient Details Name: [name] MRN: [id number] DOB: Sep 07, 1955 Today's Date: 09/03/2016   History of Present Illness  Pt admitted with CAP and L pleural effusion. PMH: prostate cancer with bone mets to ribs, ulcerative colitis.  Clinical Impression  Patient demonstrates deficits in functional mobility as indicated below. Will need continued skilled PT to address deficits and maximize function. Will see as indicated and progress as tolerated. Recommend HHPT and increased supervision/assist upon initial discharge.   OF NOTE: all activity performed on 2 liters [place], attempted room air with desaturation to 87%, >93% on 2 liters.   Provided patient with IS and educated on use    Follow Up Recommendations Home health PT;Supervision/Assistance - 24 hour    Equipment Recommendations  Rolling walker with 5" wheels    Recommendations for Other Services       Precautions / Restrictions Precautions Precautions: Fall Precaution Comments: watch 02, R knee buckles Restrictions Weight Bearing Restrictions: No      Mobility  Bed Mobility Overal bed mobility: Needs Assistance Bed Mobility: Supine to Sit     Supine to sit: Supervision     General bed mobility comments: supervision for safety  Transfers Overall transfer level: Needs assistance Equipment used: 1 person hand held assist Transfers: Sit to/from Stand Sit to Stand: Min assist         General transfer comment: assist to steady, R knee buckles  Ambulation/Gait Ambulation/Gait assistance: Min assist Ambulation Distance (Feet): 26 Feet Assistive device: 1 person hand held assist Gait Pattern/deviations: Step-through pattern;Decreased stride length;Staggering right;Trunk flexed;Narrow base of support Gait velocity: decreased Gait velocity interpretation: <1.8 ft/sec, indicative of risk for recurrent falls General Gait Details: patient extremely limited by left flank pain and right knee  pain during mobility. Decreased overall activity tolerance. Unable to tolerate increased ambulation  Stairs            Wheelchair Mobility    Modified Rankin (Stroke Patients Only)       Balance Overall balance assessment: Needs assistance Sitting-balance support: Feet supported Sitting balance-Leahy Scale: Good       Standing balance-Leahy Scale: Poor Standing balance comment: required assist for stability                             Pertinent Vitals/Pain Pain Assessment: 0-10 Pain Score: 9  Pain Location: L ribs Pain Descriptors / Indicators: Aching Pain Intervention(s): Monitored during session    Home Living Family/patient expects to be discharged to:: Private residence Living Arrangements: Alone Available Help at Discharge: Friend(s);Available PRN/intermittently Type of Home: House Home Access: Stairs to enter   Entrance Stairs-Number of Steps: 1 Home Layout: Two level;Able to live on main level with bedroom/bathroom Home Equipment: Cane - single point      Prior Function Level of Independence: Independent         Comments: runs 2 businesses     Hand Dominance   Dominant Hand: Right    Extremity/Trunk Assessment   Upper Extremity Assessment Upper Extremity Assessment: Overall WFL for tasks assessed    Lower Extremity Assessment Lower Extremity Assessment: RLE deficits/detail RLE Deficits / Details: baseline RLE deficits due to knee problems (pain and limited strength)    Cervical / Trunk Assessment Cervical / Trunk Assessment: Other exceptions (L rib pain from mets)  Communication   Communication: No difficulties  Cognition Arousal/Alertness: Awake/alert Behavior During Therapy: WFL for tasks assessed/performed Overall Cognitive Status: Within Functional Limits  for tasks assessed                      General Comments General comments (skin integrity, edema, etc.): provided patient with IS and educated on use,  provided patient with yonker and suction canister set up    Exercises     Assessment/Plan    PT Assessment Patient needs continued PT services  PT Problem List Decreased strength;Decreased activity tolerance;Decreased balance;Decreased mobility;Decreased coordination;Cardiopulmonary status limiting activity;Pain       PT Treatment Interventions DME instruction;Gait training;Functional mobility training;Therapeutic activities;Therapeutic exercise;Balance training;Patient/family education    PT Goals (Current goals can be found in the Care Plan section)  Acute Rehab PT Goals Patient Stated Goal: to return home to his dog PT Goal Formulation: With patient Time For Goal Achievement: 09/17/16 Potential to Achieve Goals: Good    Frequency Min 3X/week   Barriers to discharge Decreased caregiver support      Co-evaluation               End of Session Equipment Utilized During Treatment: Oxygen Activity Tolerance: Patient limited by fatigue;Patient limited by pain Patient left: in chair;with call bell/phone within reach Nurse Communication: Mobility status PT Visit Diagnosis: Unsteadiness on feet (R26.81);Muscle weakness (generalized) (M62.81);Difficulty in walking, not elsewhere classified (R26.2)         Time: [id number] PT Time Calculation (min) (ACUTE ONLY): 22 min   Charges:   PT Evaluation $PT Eval Moderate Complexity: 1 Procedure     PT G Codes:         Duncan Dull Sep 08, 2016, 4:03 PM Alben Deeds, [place] DPT  [phone]

## 2016-09-03 NOTE — Progress Notes (Signed)
PROGRESS NOTE    [name]  [id number] DOB: 10/23/1955 DOA: 09/02/2016 PCP: Marylene Land, MD   Brief Narrative: 62 y.o. male with a past medical history significant for prostate cancer, metastatic to bone, stable, and ulcerative colitis who presents with cough, chest pain. Patient was transferred from [place] for left lower lobe pneumonia and small left pleural effusion.   Assessment & Plan:  # Left lower lobe pneumonia with small pleural effusion , suspect parapneumonic effusion -Continue ceftriaxone and azithromycin. -IR unable to drain the fluid due to minimal complex fluid. Recommended to repeat CT scan of chest with contrast if patient does not improve in next 24-48 hours. I explained to the patient and his daughter at bedside. They agreed with the plan. -Pending legionella and strep antigen test -Follow up culture results. -Patient is currently on 2 L of oxygen, however hypoxia is not recorded. We will try to wean off oxygen gradually. I will add DuoNeb.  -We'll transfer patient out of the step down to telemetry floor. I discussed this with the patient.  #Mild elevation in troponin on admission: Repeat troponin negative. ACS less likely.  #History of prostate cancer: Recommended to follow-up with oncologist outpatient. -Patient reported that oxycodone is not helping his pain and wanted changed to Vicodin which he takes at home. Patient is also on IV Dilaudid as needed for pain management. I will add MiraLAX as needed for bowel regimen.  #History of ulcerative colitis: Continue Colazal  # Normocytic anemia: Likely iron deficiency. I will add oral iron. Monitor CBC.  Principal Problem:   Community acquired pneumonia of left lower lobe of lung ([place]) Active Problems:   Prostate cancer ([place])   Ulcerative colitis, unspecified, without complications (HCC)   Pleural effusion, left   Normocytic anemia  DVT prophylaxis: Lovenox subcutaneous Code Status:  Full code Family Communication: Patient's daughter at bedside. I discussed with the patient and her daughter at length. Disposition Plan: Likely discharge home in 2-3 days. I will add PT/OT evaluation.  Consultants:   Interventional radiologist  Procedures: Attempted ultrasound-guided thoracocentesis Antimicrobials: Ceftriaxone and azithromycin since February 25  Subjective: Patient was seen and examined at bedside. Patient reported left-sided chest pain and on the back. The pain is sharp and increased with the cough. The pain improves the pain medications. No shortness of breath. No nausea vomiting or abdominal pain. Patient's daughter at bedside. Denied diarrhea or constipation.  Objective: Vitals:   09/03/16 0945 09/03/16 1000 09/03/16 1100 09/03/16 1200  BP: 114/74     Pulse:  95 93 92  Resp:      Temp:      TempSrc:      SpO2:      Weight:      Height:        Intake/Output Summary (Last 24 hours) at 09/03/16 1230 Last data filed at 09/03/16 1200  Gross per 24 hour  Intake          1343.75 ml  Output              250 ml  Net          1093.75 ml   Filed Weights   09/02/16 1852 09/03/16 0500  Weight: 79.8 kg (175 lb 14.8 oz) 79.9 kg (176 lb 2.4 oz)    Examination:  General exam: Appears calm and comfortable  Respiratory system: Lungs left lower lobe crackles, respiratory effort normal, no wheezing Cardiovascular system: S1 & S2 heard, RRR.  No  pedal edema. Gastrointestinal system: Abdomen is nondistended, soft and nontender. Normal bowel sounds heard. Central nervous system: Alert and oriented. No focal neurological deficits. Extremities: Symmetric 5 x 5 power. Skin: No rashes, lesions or ulcers Psychiatry: Judgement and insight appear normal. Mood & affect appropriate.     Data Reviewed: I have personally reviewed following labs and imaging studies  CBC:  Recent Labs Lab 09/01/16 0650 09/02/16 1305 09/03/16 0220  WBC 11.6* 14.7* 15.3*  NEUTROABS 9.6*  12.3*  --   HGB 12.1* 11.3* 11.6*  HCT 36.2* 33.7* 34.9*  MCV 95.0 94.9 93.3  PLT 417* 419* 123456*   Basic Metabolic Panel:  Recent Labs Lab 09/01/16 0650 09/02/16 1305 09/03/16 0220  NA 137 134* 136  K 4.1 4.3 4.0  CL 103 99* 100*  CO2 26 27 26   GLUCOSE 128* 104* 138*  BUN 25* 23* 17  CREATININE 1.05 0.87 0.88  CALCIUM 8.9 8.9 8.7*   GFR: Estimated Creatinine Clearance: 88.6 mL/min (by C-G formula based on SCr of 0.88 mg/dL). Liver Function Tests:  Recent Labs Lab 09/02/16 1305 09/03/16 0220  AST 14* 24  ALT 18 27  ALKPHOS 66 91  BILITOT 0.6 0.3  PROT 7.3 6.6  ALBUMIN 3.3* 3.0*    Recent Labs Lab 09/02/16 1305  LIPASE 16   No results for input(s): AMMONIA in the last 168 hours. Coagulation Profile: No results for input(s): INR, PROTIME in the last 168 hours. Cardiac Enzymes:  Recent Labs Lab 09/02/16 1305 09/02/16 2003  TROPONINI 0.03* <0.03   BNP (last 3 results) No results for input(s): PROBNP in the last 8760 hours. HbA1C: No results for input(s): HGBA1C in the last 72 hours. CBG: No results for input(s): GLUCAP in the last 168 hours. Lipid Profile: No results for input(s): CHOL, HDL, LDLCALC, TRIG, CHOLHDL, LDLDIRECT in the last 72 hours. Thyroid Function Tests:  Recent Labs  09/03/16 0220  TSH 1.704   Anemia Panel:  Recent Labs  09/03/16 0220  VITAMINB12 777  FERRITIN 764*  TIBC 203*  IRON 13*   Sepsis Labs:  Recent Labs Lab 09/02/16 1409  LATICACIDVEN 1.55    Recent Results (from the past 240 hour(s))  MRSA PCR Screening     Status: None   Collection Time: 09/02/16  7:01 PM  Result Value Ref Range Status   MRSA by PCR NEGATIVE NEGATIVE Final    Comment:        The GeneXpert MRSA Assay (FDA approved for NASAL specimens only), is one component of a comprehensive MRSA colonization surveillance program. It is not intended to diagnose MRSA infection nor to guide or monitor treatment for MRSA infections.           Radiology Studies: Dg Chest 2 View  Result Date: 09/02/2016 CLINICAL DATA:  Left-sided chest pain.  Pneumonia. EXAM: CHEST  2 VIEW COMPARISON:  Two-view chest x-ray and CT the chest 2/24/ 18. FINDINGS: Heart size is upper limits of normal. A left pleural effusion has progressed. Increasing left lower lobe airspace disease is noted. The right lung remains clear. IMPRESSION: 1. Progressive left lower lobe pneumonia with increasing pleural fluid. Electronically Signed   By: San Morelle M.D.   On: 09/02/2016 14:46   [place] Chest  Result Date: 09/03/2016 CLINICAL DATA:  Patient with metastatic prostate cancer and a recent history of community acquired pneumonia. Request is made for thoracentesis. EXAM: CHEST ULTRASOUND COMPARISON:  None. FINDINGS: Left chest reveals consolidation of his lung along with what appears to be  complex fluid. No simple fluid is identified. IMPRESSION: Unable to perform thoracentesis secondary to minimal complex fluid. If the patient does not improve over the next 24-48 hours, we would recommend a repeat CT scan with contrast to evaluate for empyema given appearance on ultrasound today. Read by: Saverio Danker, PA-C Electronically Signed   By: Aletta Edouard M.D.   On: 09/03/2016 10:33        Scheduled Meds: . azithromycin  500 mg Oral Q24H  . balsalazide  2,250 mg Oral Daily  . cefTRIAXone (ROCEPHIN)  IV  1 g Intravenous Q24H  . enoxaparin (LOVENOX) injection  40 mg Subcutaneous Q24H  . ferrous sulfate  325 mg Oral Q breakfast  . lidocaine (PF)      . [START ON 09/04/2016] pneumococcal 23 valent vaccine  0.5 mL Intramuscular Tomorrow-1000  . simvastatin  20 mg Oral QPC supper  . sodium chloride flush  3 mL Intravenous Q12H   Continuous Infusions: . sodium chloride 75 mL/hr at 09/03/16 1200     LOS: 1 day    Eesha Schmaltz Tanna Furry, MD [facility] Pager [phone]  If 7PM-7AM, please contact night-coverage [email or web address] Password  [email or web address] 09/03/2016, 12:30 PM

## 2016-09-03 NOTE — Progress Notes (Signed)
1st attempt to call report. RN to call back. 

## 2016-09-03 NOTE — Care Management Note (Signed)
Case Management Note  Patient Details  Name: [name] MRN: [id number] Date of Birth: [birth year]  Subjective/Objective:  Presents with CAP and Left pl. Effusion, hx of prostate ca, and ulcerative colitis.  IR unable to drain the fluid dur to minimal complex fluid per MD note, rec to repeat CT scan of chest if patient does not improve in next 24-48 hrs. Conts on ivf's and iv abx, iv pain meds prn.   PT/OT eval pending, he lives home alone with his dog.  He has insurance with medication coverage and he has a PCP and he has transportation at [employer].  Patient has no DME at home.    NCM left [place] list with daughter for [facility] services if needed.  Daughter, Caryl Pina will look into and decide on [facility] agency if needed.  NCM will cont to follow for dc needs.                 Action/Plan:   Expected Discharge Date:                  Expected Discharge Plan:  [place]  In-House Referral:     Discharge planning Services  CM Consult  Post Acute Care Choice:    Choice offered to:     DME Arranged:    DME Agency:     HH Arranged:    [place] Agency:     Status of Service:  In process, will continue to follow  If discussed at Long Length of Stay Meetings, dates discussed:    Additional Comments:  [name], RN 09/03/2016, 12:53 PM

## 2016-09-03 NOTE — Progress Notes (Signed)
Patient transported to 6E01. All personal belongings are accounted for. Family friend is with patient. No questions or concerns at this time.

## 2016-09-03 NOTE — Progress Notes (Signed)
IR unable to perform thoracentesis, patient visibly distraught. Daughter is at bedside and they would like to speak to the physician to discuss further plan of care. Paged Dr. Carolin Sicks and he stated he will call the family and talk with them.

## 2016-09-04 ENCOUNTER — Inpatient Hospital Stay (HOSPITAL_COMMUNITY): Payer: BLUE CROSS/BLUE SHIELD

## 2016-09-04 ENCOUNTER — Encounter (HOSPITAL_COMMUNITY): Payer: Self-pay | Admitting: Pulmonary Disease

## 2016-09-04 DIAGNOSIS — R918 Other nonspecific abnormal finding of lung field: Secondary | ICD-10-CM

## 2016-09-04 DIAGNOSIS — R042 Hemoptysis: Secondary | ICD-10-CM

## 2016-09-04 LAB — CBC
HCT: 31.3 % — ABNORMAL LOW (ref 39.0–52.0)
Hemoglobin: 10.2 g/dL — ABNORMAL LOW (ref 13.0–17.0)
MCH: 30.4 pg (ref 26.0–34.0)
MCHC: 32.6 g/dL (ref 30.0–36.0)
MCV: 93.4 fL (ref 78.0–100.0)
PLATELETS: 402 10*3/uL — AB (ref 150–400)
RBC: 3.35 MIL/uL — ABNORMAL LOW (ref 4.22–5.81)
RDW: 12.6 % (ref 11.5–15.5)
WBC: 16.8 10*3/uL — AB (ref 4.0–10.5)

## 2016-09-04 LAB — RESPIRATORY PANEL BY PCR
ADENOVIRUS-RVPPCR: NOT DETECTED
Bordetella pertussis: NOT DETECTED
CORONAVIRUS 229E-RVPPCR: NOT DETECTED
CORONAVIRUS HKU1-RVPPCR: NOT DETECTED
CORONAVIRUS NL63-RVPPCR: NOT DETECTED
Chlamydophila pneumoniae: NOT DETECTED
Coronavirus OC43: NOT DETECTED
Influenza A: NOT DETECTED
Influenza B: NOT DETECTED
METAPNEUMOVIRUS-RVPPCR: NOT DETECTED
Mycoplasma pneumoniae: NOT DETECTED
PARAINFLUENZA VIRUS 1-RVPPCR: NOT DETECTED
Parainfluenza Virus 2: NOT DETECTED
Parainfluenza Virus 3: NOT DETECTED
Parainfluenza Virus 4: NOT DETECTED
Respiratory Syncytial Virus: NOT DETECTED
Rhinovirus / Enterovirus: NOT DETECTED

## 2016-09-04 LAB — FOLATE RBC
FOLATE, HEMOLYSATE: 532.4 ng/mL
Folate, RBC: 1769 ng/mL (ref >498)
HEMATOCRIT: 30.1 % — AB (ref 37.5–51.0)

## 2016-09-04 LAB — INFLUENZA PANEL BY PCR (TYPE A & B)
INFLAPCR: NEGATIVE
Influenza B By PCR: NEGATIVE

## 2016-09-04 LAB — PROCALCITONIN: Procalcitonin: 0.35 ng/mL

## 2016-09-04 NOTE — Consult Note (Signed)
Name: [name] MRN: [id number] DOB: 05/24/1956    ADMISSION DATE:  09/02/2016 CONSULTATION DATE:  09/04/2016 REFERRING MD :  Dr. Carolin Sicks [facility]  CHIEF COMPLAINT:  L rib pain  HPI: 61 year old male with PMH as below, which is significant for prostate Ca (s/p resection) with mets to bone (rib) and ulcerative colitis. He was treated with chemotherapy and radiation after diagnosis, and had remained on hormone therapy up until January of 2018. About 2 [place] after coming off of this therapy (5 Schewe PTA) he developed a "flu like illness" (fever, malaise, cough) that was largely self limiting, however, his cough continued to linger. Mostly non-productive. 2/21 his symptoms began to worsen, specifically L chest/flank pain, worse with inspiration. PCP noted clear lungs and felt related to known bony mets to L rib. 2/24 pain was excruciating and he presented to ED. Underwent CXR and CT demonstrating PNA with small effusion. He was discharged to home with prescriptions for Levaquin and pain medications. 2/25 pain worsened and expanded to his anterior L chest and he returned to ED. CXR revealed worsening consolidation vs effusion and he was admitted to the hospitalist team. He was treated with Vanc/Zosyn in ED, and transitioned to CTX and azithro on admission. He was sent to IR for thoracentesis, however, unable to be done due to small effusion volume and complex appearance on ultrasound. PCCM asked to assist further.  SIGNIFICANT EVENTS  2/24 to ED with L chest pain diagnosed with CAP and discharged with levaquin.  2/25 symptoms worse, CXR worse, admit for IV ABX 2/26 unable to thora due to small complex effusion  STUDIES:  CT angio chest 2/25 > Negative for pulmonary embolus. Left lower lobe airspace disease and small effusion most consistent with pneumonia.   PAST MEDICAL HISTORY :   has a past medical history of Allergy; Anxiety; Arthritis; Hyperlipidemia; Metastatic cancer to bone ([place]); Neoplasm  of face; Prostate cancer ([place]); Sleep apnea; and Ulcerative colitis.  has a past surgical history that includes Inguinal hernia repair; Shoulder Arthrotomy (Right); and Prostatectomy (04/2015). Prior to Admission medications   Medication Sig Start Date End Date Taking? Authorizing Provider  balsalazide (COLAZAL) 750 MG capsule Take 2,250 mg by mouth daily.   Yes Historical Provider, MD  Cholecalciferol (VITAMIN D) 2000 UNITS tablet Take 4,000 Units by mouth daily.   Yes Historical Provider, MD  Cyanocobalamin (VITAMIN B 12 PO) Take by mouth.   Yes Historical Provider, MD  ferrous sulfate 325 (65 FE) MG tablet Take 325 mg by mouth daily with breakfast.   Yes Historical Provider, MD  fish oil-omega-3 fatty acids 1000 MG capsule Take 2 g by mouth daily.     Yes Historical Provider, MD  glucosamine-chondroitin 500-400 MG tablet Take 1 tablet by mouth 3 (three) times daily.     Yes Historical Provider, MD  HYDROcodone-acetaminophen (NORCO/VICODIN) 5-325 MG tablet Take 1 tablet by mouth every 4 (four) hours as needed. 09/01/16  Yes Tanna Furry, MD  HYDROcodone-homatropine [facility]) 5-1.5 MG/5ML syrup Take 5 mLs by mouth every 8 (eight) hours as needed. 08/01/16  Yes Dorena Cookey, MD  Lycopene 10 MG CAPS Take 10 mg by mouth.   Yes Historical Provider, MD  Multiple Vitamin (MULTIVITAMIN) tablet Take 1 tablet by mouth daily.     Yes Historical Provider, MD  simvastatin (ZOCOR) 20 MG tablet Take 20 mg by mouth daily after supper. 07/31/16  Yes Historical Provider, MD  valACYclovir (VALTREX) 500 MG tablet Take 500 mg by  mouth 2 (two) times daily. PRN   Yes Historical Provider, MD  naproxen (NAPROSYN) 500 MG tablet Take 1 tablet (500 mg total) by mouth 2 (two) times daily. Patient not taking: Reported on 09/03/2016 09/01/16   Tanna Furry, MD   Allergies  Allergen Reactions  . Sulfamethoxazole     REACTION: unspecified    FAMILY HISTORY:  family history includes Heart attack in his father; Juvenile  idiopathic arthritis in his daughter; Raynaud syndrome in his daughter. SOCIAL HISTORY:  reports that he has quit smoking. He has never used smokeless tobacco. He reports that he drinks alcohol. He reports that he does not use drugs.  REVIEW OF SYSTEMS:   Bolds are positive  Constitutional: weight loss, gain, night sweats, Fevers, chills, fatigue .  HEENT: headaches, Sore throat, sneezing, nasal congestion, post nasal drip, Difficulty swallowing, Tooth/dental problems, visual complaints visual changes, ear ache CV:  chest pain, radiates:,Orthopnea, PND, swelling in lower extremities**, dizziness, palpitations, syncope.  GI  heartburn, indigestion, abdominal pain, nausea, vomiting, diarrhea, change in bowel habits, loss of appetite, bloody stools.  Resp: cough, productive: , hemoptysis, dyspnea, chest pain, pleuritic.  Skin: rash or itching or icterus GU: dysuria, change in color of urine, urgency or frequency. flank pain, hematuria  MS: joint pain or swelling. decreased range of motion  Psych: change in mood or affect. depression or anxiety.  Neuro: difficulty with speech, weakness, numbness, ataxia    SUBJECTIVE:   VITAL SIGNS: Temp:  [97.7 F (36.5 C)-99.1 F (37.3 C)] 98.2 F (36.8 C) (02/27 1000) Pulse Rate:  [83-95] 90 (02/27 1000) Resp:  [19-30] 20 (02/27 1000) BP: (106-131)/(63-72) 131/63 (02/27 1000) SpO2:  [94 %-99 %] 96 % (02/27 1000) Weight:  [80.1 kg (176 lb 9.4 oz)] 80.1 kg (176 lb 9.4 oz) (02/26 2216)  PHYSICAL EXAMINATION: General:  Male of normal body habitus in NAD Neuro:  Alert, oriented, non-focal HEENT:  [place]/AT, PERRL, no JVD Cardiovascular:  RRR, no MRG Lungs:  Diminished L base Abdomen:  Soft, non-tender, non-distended Musculoskeletal:  No acute deformity or ROM limitation Skin:  Grossly intact   Recent Labs Lab 09/01/16 0650 09/02/16 1305 09/03/16 0220  NA 137 134* 136  K 4.1 4.3 4.0  CL 103 99* 100*  CO2 26 27 26   BUN 25* 23* 17  CREATININE  1.05 0.87 0.88  GLUCOSE 128* 104* 138*    Recent Labs Lab 09/02/16 1305 09/03/16 0220 09/04/16 0718  HGB 11.3* 11.6* 10.2*  HCT 33.7* 34.9* 31.3*  WBC 14.7* 15.3* 16.8*  PLT 419* 412* 402*   Dg Chest 2 View  Result Date: 09/02/2016 CLINICAL DATA:  Left-sided chest pain.  Pneumonia. EXAM: CHEST  2 VIEW COMPARISON:  Two-view chest x-ray and CT the chest 2/24/ 18. FINDINGS: Heart size is upper limits of normal. A left pleural effusion has progressed. Increasing left lower lobe airspace disease is noted. The right lung remains clear. IMPRESSION: 1. Progressive left lower lobe pneumonia with increasing pleural fluid. Electronically Signed   By: San Morelle M.D.   On: 09/02/2016 14:46   [place] Chest  Result Date: 09/03/2016 CLINICAL DATA:  Patient with metastatic prostate cancer and a recent history of community acquired pneumonia. Request is made for thoracentesis. EXAM: CHEST ULTRASOUND COMPARISON:  None. FINDINGS: Left chest reveals consolidation of his lung along with what appears to be complex fluid. No simple fluid is identified. IMPRESSION: Unable to perform thoracentesis secondary to minimal complex fluid. If the patient does not improve over the next 24-48  hours, we would recommend a repeat CT scan with contrast to evaluate for empyema given appearance on ultrasound today. Read by: Saverio Danker, PA-C Electronically Signed   By: Aletta Edouard M.D.   On: 09/03/2016 10:33    ASSESSMENT / PLAN:  Community Acquired Pneumonia vs viral pneumonia - Continue CTX/azithro as above - RVP/Flu PCR - Trend PCT, WBC, Fever curve - Follow 2/27 cultures - Please collect previously ordered s. pneumo and legionella urinary antigens  Left sided pleural effusion: complex by IR ultrasound evaluation. Most likely this represents parapneumonic effusion. Of course, potential for empyema and metastatic effusion remain in ddx. Less likely represents a transudate.  - CXR today to trend ABX response -  If WBC not trending down within next 24-48 hours would repeat CT with increasing concern for empyema.    Metastatic prostate Ca: finished hormone therapy just several [place] ago - Per primary  Rest per primary team  Georgann Housekeeper, AGACNP-BC [place] [facility] Pager [phone] or [phone]  09/04/2016 11:26 AM

## 2016-09-04 NOTE — Progress Notes (Signed)
Physical Therapy Treatment Patient Details Name: [name] MRN: [id number] DOB: May 22, 1956 Today's Date: 09/04/2016    History of Present Illness Pt admitted with CAP and L pleural effusion. PMH: prostate cancer with bone mets to ribs, ulcerative colitis.    PT Comments    Progressing well with stamina and gait stability.  Still requiring oxygen and sats drop very mildly on 2-3 L [place] with quick return into the low 90's   Follow Up Recommendations  Home health PT;Supervision/Assistance - 24 hour     Equipment Recommendations  Rolling walker with 5" wheels (MAY END UP NOT NEEDING RW)    Recommendations for Other Services       Precautions / Restrictions Precautions Precautions: Fall Precaution Comments: watch 02    Mobility  Bed Mobility                  Transfers Overall transfer level: Needs assistance   Transfers: Sit to/from Stand Sit to Stand: Min guard         General transfer comment: guard for safety  Ambulation/Gait Ambulation/Gait assistance: Min guard Ambulation Distance (Feet): 400 Feet Assistive device:  (portable O2 Caddie) Gait Pattern/deviations: Step-through pattern Gait velocity: decreased Gait velocity interpretation: at or above normal speed for age/gender General Gait Details: generally steady and moderate speed.  sats on 2L dropped to 87% and EHR 90 bpm, on 3L, sats dropped to 88-90% with quick return to 92/93%  EHR in th 80's   Stairs            Wheelchair Mobility    Modified Rankin (Stroke Patients Only)       Balance     Sitting balance-Leahy Scale: Good       Standing balance-Leahy Scale: Fair                      Cognition Arousal/Alertness: Awake/alert Behavior During Therapy: WFL for tasks assessed/performed Overall Cognitive Status: Within Functional Limits for tasks assessed                      Exercises General Exercises - Lower Extremity Hip ABduction/ADduction:  AROM;Strengthening;Both;10 reps;Standing Hip Flexion/Marching: AROM;Strengthening;Both;10 reps;Standing Toe Raises: AROM;Both;10 reps;Standing Heel Raises: AROM;Both;10 reps;Standing Mini-Sqauts: AROM;Strengthening;Both;10 reps;Standing    General Comments        Pertinent Vitals/Pain Pain Assessment: Faces Faces Pain Scale: Hurts little more Pain Location: L ribs Pain Descriptors / Indicators: Aching Pain Intervention(s): Monitored during session;Repositioned    Home Living                      Prior Function            PT Goals (current goals can now be found in the care plan section) Acute Rehab PT Goals Patient Stated Goal: to return home to his dog PT Goal Formulation: With patient Time For Goal Achievement: 09/17/16 Potential to Achieve Goals: Good    Frequency    Min 3X/week      PT Plan Current plan remains appropriate    Co-evaluation             End of Session Equipment Utilized During Treatment: Oxygen Activity Tolerance: Patient tolerated treatment well Patient left: in chair;with call bell/phone within reach Nurse Communication: Mobility status PT Visit Diagnosis: Unsteadiness on feet (R26.81);Muscle weakness (generalized) (M62.81)     Time: [id number] PT Time Calculation (min) (ACUTE ONLY): 24 min  Charges:  $Gait Training: 8-22 mins $Therapeutic  Exercise: 8-22 mins                    G Codes:       Tessie Fass Chidiebere Wynn 09/04/2016, 11:58 AM 09/04/2016  Donnella Sham, [place] [phone]  (pager)

## 2016-09-04 NOTE — Progress Notes (Addendum)
PROGRESS NOTE    OAKLAND DIRKSEN  [id number] DOB: [birth year] DOA: 09/02/2016 PCP: Marylene Land, MD   Brief Narrative: 61 y.o. male with a past medical history significant for prostate cancer, metastatic to bone, stable, and ulcerative colitis who presents with cough, chest pain. Patient was transferred from [facility] [facility] for left lower lobe pneumonia and small left pleural effusion.   Assessment & Plan:  # Left lower lobe pneumonia with left pleural effusion , suspect parapneumonic effusion -IR unable to drain the fluid due to minimal complex fluid. Recommended to repeat CT scan of chest with contrast if patient does not improve in next 24-48 hours.  -Patient with no significant clinical improvement. He continues to have left-sided chest pain mostly in the back side. I consulted the pulmonologist today and discussed with Dr. Ashok Cordia. The repeat chest x-ray showed complete consolidation of left thorax suggesting pleural effusion. I called and discussed the abnormal chest x-ray with critical care team Heber Jeff Davis, Eddie Dibbles, Np). Patient likely needs thoracocentesis, defer to pulmonologist. -Ordered repeat blood cultures because of leukocytosis -Patient is currently on 2 L of oxygen, however hypoxia is not recorded. We will try to wean off oxygen gradually. I will add DuoNeb.  -Continue ceftriaxone and azithromycin.  #Mild elevation in troponin on admission: Repeat troponin negative. ACS less likely.  #History of prostate cancer: Recommended to follow-up with oncologist outpatient. -Patient reported that her current pain medication is helping his pain management. He is currently on Vicodin and Dilaudid as needed. He is also on MiraLAX as needed.  #History of ulcerative colitis: Continue Colazal  # Normocytic anemia: Likely iron deficiency. Continue oral iron. Monitor CBC.  Addendum 2:50 Pm: I again called and discussed with PCCM (hoffman) regarding the plan for left lung  consolidation. As per him, he did bedside US which showed possible consolidation. They are discussing about further plan including ? CT chest ? Bronchoscopy etc. Defer to pulmonary team for further management.   Principal Problem:   Community acquired pneumonia of left lower lobe of lung ([place]) Active Problems:   Prostate cancer ([place])   Ulcerative colitis, unspecified, without complications (HCC)   Pleural effusion, left   Normocytic anemia  DVT prophylaxis: hold lovenox now since patient may need thoracocentesis. Code Status: Full code Family Communication: Patient's son at bedside. Disposition Plan: Likely discharge home in 2-3 days.  Consultants:   Interventional radiologist  Pulmonologist  Procedures: Attempted ultrasound-guided thoracocentesis Antimicrobials: Ceftriaxone and azithromycin since February 25  Subjective: Patient was seen and examined at bedside. No significant clinical improvement today. Continues to have left-sided chest pain mostly in the back side associated with cough. Denied nausea vomiting fevers or chills headache. I discussed with the patient and his son at bedside regarding further care including pulmonary consult and further imaging studies depending on consultant's evaluation.  Objective: Vitals:   09/03/16 2011 09/03/16 2216 09/04/16 0421 09/04/16 1000  BP:  122/71 117/68 131/63  Pulse:  83 86 90  Resp:  20 19 20   Temp:  98.8 F (37.1 C) 98.4 F (36.9 C) 98.2 F (36.8 C)  TempSrc:  Oral Oral Oral  SpO2: 99% 95% 95% 96%  Weight:  80.1 kg (176 lb 9.4 oz)    Height:        Intake/Output Summary (Last 24 hours) at 09/04/16 1219 Last data filed at 09/04/16 0900  Gross per 24 hour  Intake              630 ml  Output              750 ml  Net             -120 ml   Filed Weights   09/02/16 1852 09/03/16 0500 09/03/16 2216  Weight: 79.8 kg (175 lb 14.8 oz) 79.9 kg (176 lb 2.4 oz) 80.1 kg (176 lb 9.4 oz)    Examination:  General exam: Lying  on bed comfortable, not in distress Respiratory system: Decreased breath sound on the left side, no wheezing, respiratory effort normal. Cardiovascular system: Regular rate rhythm, S1-S2 normal. No pedal edema. Gastrointestinal system: Abdomen soft, nontender, nondistended. Bowel sound positive. Central nervous system: Alert, awake, oriented. No focal neurological deficit  Extremities: Symmetric 5 x 5 power. Skin: No rashes, lesions or ulcers Psychiatry: Judgement and insight appear normal. Mood & affect appropriate.     Data Reviewed: I have personally reviewed following labs and imaging studies  CBC:  Recent Labs Lab 09/01/16 0650 09/02/16 1305 09/03/16 0220 09/04/16 0718  WBC 11.6* 14.7* 15.3* 16.8*  NEUTROABS 9.6* 12.3*  --   --   HGB 12.1* 11.3* 11.6* 10.2*  HCT 36.2* 33.7* 34.9* 31.3*  MCV 95.0 94.9 93.3 93.4  PLT 417* 419* 412* [id number]*   Basic Metabolic Panel:  Recent Labs Lab 09/01/16 0650 09/02/16 1305 09/03/16 0220  NA 137 134* 136  K 4.1 4.3 4.0  CL 103 99* 100*  CO2 26 27 26   GLUCOSE 128* 104* 138*  BUN 25* 23* 17  CREATININE 1.05 0.87 0.88  CALCIUM 8.9 8.9 8.7*   GFR: Estimated Creatinine Clearance: 88.8 mL/min (by C-G formula based on SCr of 0.88 mg/dL). Liver Function Tests:  Recent Labs Lab 09/02/16 1305 09/03/16 0220  AST 14* 24  ALT 18 27  ALKPHOS 66 91  BILITOT 0.6 0.3  PROT 7.3 6.6  ALBUMIN 3.3* 3.0*    Recent Labs Lab 09/02/16 1305  LIPASE 16   No results for input(s): AMMONIA in the last 168 hours. Coagulation Profile: No results for input(s): INR, PROTIME in the last 168 hours. Cardiac Enzymes:  Recent Labs Lab 09/02/16 1305 09/02/16 2003  TROPONINI 0.03* <0.03   BNP (last 3 results) No results for input(s): PROBNP in the last 8760 hours. HbA1C: No results for input(s): HGBA1C in the last 72 hours. CBG: No results for input(s): GLUCAP in the last 168 hours. Lipid Profile: No results for input(s): CHOL, HDL, LDLCALC,  TRIG, CHOLHDL, LDLDIRECT in the last 72 hours. Thyroid Function Tests:  Recent Labs  09/03/16 0220  TSH 1.704   Anemia Panel:  Recent Labs  09/03/16 0220  VITAMINB12 777  FERRITIN 764*  TIBC 203*  IRON 13*   Sepsis Labs:  Recent Labs Lab 09/02/16 1409  LATICACIDVEN 1.55    Recent Results (from the past 240 hour(s))  MRSA PCR Screening     Status: None   Collection Time: 09/02/16  7:01 PM  Result Value Ref Range Status   MRSA by PCR NEGATIVE NEGATIVE Final    Comment:        The GeneXpert MRSA Assay (FDA approved for NASAL specimens only), is one component of a comprehensive MRSA colonization surveillance program. It is not intended to diagnose MRSA infection nor to guide or monitor treatment for MRSA infections.          Radiology Studies: Dg Chest 2 View  Result Date: 09/02/2016 CLINICAL DATA:  Left-sided chest pain.  Pneumonia. EXAM: CHEST  2 VIEW COMPARISON:  Two-view chest  x-ray and CT the chest 2/24/ 18. FINDINGS: Heart size is upper limits of normal. A left pleural effusion has progressed. Increasing left lower lobe airspace disease is noted. The right lung remains clear. IMPRESSION: 1. Progressive left lower lobe pneumonia with increasing pleural fluid. Electronically Signed   By: San Morelle M.D.   On: 09/02/2016 14:46   [place] Chest  Result Date: 09/03/2016 CLINICAL DATA:  Patient with metastatic prostate cancer and a recent history of community acquired pneumonia. Request is made for thoracentesis. EXAM: CHEST ULTRASOUND COMPARISON:  None. FINDINGS: Left chest reveals consolidation of his lung along with what appears to be complex fluid. No simple fluid is identified. IMPRESSION: Unable to perform thoracentesis secondary to minimal complex fluid. If the patient does not improve over the next 24-48 hours, we would recommend a repeat CT scan with contrast to evaluate for empyema given appearance on ultrasound today. Read by: Saverio Danker, PA-C  Electronically Signed   By: Aletta Edouard M.D.   On: 09/03/2016 10:33   Dg Chest Port 1 View  Result Date: 09/04/2016 CLINICAL DATA:  61 year old male with increasing shortness breath. Metastatic prostate cancer. Prior smoker. Subsequent encounter. EXAM: PORTABLE CHEST 1 VIEW COMPARISON:  09/02/2016 chest x-ray.  09/01/2016 CT. FINDINGS: Almost complete consolidation left thorax suggesting enlarging pleural effusion. Only small portion of the left upper lobe remains aerated. Shunting of blood to right lung. Right apical pleural thickening without associated bony destruction. Heart appears enlarged. Metastatic lesion left sixth rib better appreciated on CT. IMPRESSION: Almost complete consolidation left thorax suggesting enlarging pleural effusion. Only small portion of the left upper lobe remains aerated. These results will be called to the ordering clinician or representative by the Radiologist Assistant, and communication documented in the PACS or zVision Dashboard. Electronically Signed   By: Genia Del M.D.   On: 09/04/2016 12:06        Scheduled Meds: . azithromycin  500 mg Oral Q24H  . balsalazide  2,250 mg Oral Daily  . cefTRIAXone (ROCEPHIN)  IV  1 g Intravenous Q24H  . enoxaparin (LOVENOX) injection  40 mg Subcutaneous Q24H  . ferrous sulfate  325 mg Oral Q breakfast  . simvastatin  20 mg Oral QPC supper  . sodium chloride flush  3 mL Intravenous Q12H   Continuous Infusions:    LOS: 2 days    Alyss Granato Tanna Furry, MD [facility] Pager [phone]  If 7PM-7AM, please contact night-coverage [email or web address] Password [facility] 09/04/2016, 12:19 PM

## 2016-09-05 ENCOUNTER — Ambulatory Visit (HOSPITAL_COMMUNITY)
Admission: RE | Admit: 2016-09-05 | Payer: BLUE CROSS/BLUE SHIELD | Source: Ambulatory Visit | Admitting: Pulmonary Disease

## 2016-09-05 ENCOUNTER — Encounter (HOSPITAL_COMMUNITY): Payer: BLUE CROSS/BLUE SHIELD

## 2016-09-05 ENCOUNTER — Encounter (HOSPITAL_COMMUNITY)
Admission: EM | Disposition: A | Discharge status: Home Health | Payer: Self-pay | Source: Home / Self Care | Attending: Thoracic Surgery (Cardiothoracic Vascular Surgery)

## 2016-09-05 ENCOUNTER — Encounter (HOSPITAL_COMMUNITY): Admission: RE | Payer: Self-pay | Source: Ambulatory Visit

## 2016-09-05 DIAGNOSIS — J869 Pyothorax without fistula: Secondary | ICD-10-CM

## 2016-09-05 DIAGNOSIS — R0902 Hypoxemia: Secondary | ICD-10-CM

## 2016-09-05 DIAGNOSIS — J90 Pleural effusion, not elsewhere classified: Secondary | ICD-10-CM

## 2016-09-05 DIAGNOSIS — J181 Lobar pneumonia, unspecified organism: Secondary | ICD-10-CM

## 2016-09-05 DIAGNOSIS — R918 Other nonspecific abnormal finding of lung field: Secondary | ICD-10-CM

## 2016-09-05 LAB — PROTIME-INR
INR: 1.18
PROTHROMBIN TIME: 15.1 s (ref 11.4–15.2)

## 2016-09-05 LAB — TYPE AND SCREEN
ABO/RH(D): O POS
ANTIBODY SCREEN: NEGATIVE

## 2016-09-05 LAB — CBC
HEMATOCRIT: 31.2 % — AB (ref 39.0–52.0)
HEMOGLOBIN: 10.5 g/dL — AB (ref 13.0–17.0)
MCH: 31.3 pg (ref 26.0–34.0)
MCHC: 33.7 g/dL (ref 30.0–36.0)
MCV: 92.9 fL (ref 78.0–100.0)
Platelets: 447 10*3/uL — ABNORMAL HIGH (ref 150–400)
RBC: 3.36 MIL/uL — AB (ref 4.22–5.81)
RDW: 12.6 % (ref 11.5–15.5)
WBC: 17.2 10*3/uL — ABNORMAL HIGH (ref 4.0–10.5)

## 2016-09-05 LAB — URINALYSIS, ROUTINE W REFLEX MICROSCOPIC
Bacteria, UA: NONE SEEN
Bilirubin Urine: NEGATIVE
GLUCOSE, UA: NEGATIVE mg/dL
KETONES UR: NEGATIVE mg/dL
LEUKOCYTES UA: NEGATIVE
Nitrite: NEGATIVE
PH: 7 (ref 5.0–8.0)
Protein, ur: NEGATIVE mg/dL
SPECIFIC GRAVITY, URINE: 1.006 (ref 1.005–1.030)

## 2016-09-05 LAB — BASIC METABOLIC PANEL
Anion gap: 10 (ref 5–15)
BUN: 12 mg/dL (ref 6–20)
CHLORIDE: 97 mmol/L — AB (ref 101–111)
CO2: 28 mmol/L (ref 22–32)
Calcium: 8.6 mg/dL — ABNORMAL LOW (ref 8.9–10.3)
Creatinine, Ser: 0.8 mg/dL (ref 0.61–1.24)
GFR calc Af Amer: >60 mL/min (ref >60)
GFR calc non Af Amer: >60 mL/min (ref >60)
Glucose, Bld: 101 mg/dL — ABNORMAL HIGH (ref 65–99)
POTASSIUM: 3.6 mmol/L (ref 3.5–5.1)
SODIUM: 135 mmol/L (ref 135–145)

## 2016-09-05 LAB — APTT: APTT: 35 s (ref 24–36)

## 2016-09-05 LAB — ABO/RH: ABO/RH(D): O POS

## 2016-09-05 SURGERY — VIDEO BRONCHOSCOPY WITHOUT FLUORO
Anesthesia: Moderate Sedation | Laterality: Bilateral

## 2016-09-05 MED ORDER — ENOXAPARIN SODIUM 40 MG/0.4ML ~~LOC~~ SOLN
40.0000 mg | SUBCUTANEOUS | Status: DC
  Administered 2016-09-05: 40 mg via SUBCUTANEOUS
  Filled 2016-09-05: qty 0.4

## 2016-09-05 MED ORDER — VANCOMYCIN HCL IN DEXTROSE 1-5 GM/200ML-% IV SOLN
1000.0000 mg | INTRAVENOUS | Status: AC
  Administered 2016-09-06: 1000 mg via INTRAVENOUS
  Filled 2016-09-05 (×2): qty 200

## 2016-09-05 MED ORDER — DOCUSATE SODIUM 100 MG PO CAPS
100.0000 mg | ORAL_CAPSULE | Freq: Two times a day (BID) | ORAL | Status: DC
  Administered 2016-09-05 (×2): 100 mg via ORAL
  Filled 2016-09-05 (×2): qty 1

## 2016-09-05 MED ORDER — BALSALAZIDE DISODIUM 750 MG PO CAPS
750.0000 mg | ORAL_CAPSULE | Freq: Two times a day (BID) | ORAL | Status: DC
  Administered 2016-09-05 – 2016-09-11 (×10): 750 mg via ORAL
  Filled 2016-09-05 (×12): qty 1

## 2016-09-05 NOTE — Consult Note (Signed)
Reason for Consult:Left empyema Referring Physician: [facility]  [name] is an 61 y.o. male.  HPI: 61 yo ma with a PMH of metastatic prostate cancer, ulcerative colitis, sleep apnea, arthritis, hyperlipidemia and anxiety. He had a "mild case of the flu about 6 Nickless ago." He developed left sided rib pain on Saturday. Went to Ed and was found to have a small left pleural effusion. Was given PO antibiotics and discharged. Had worsening pain and increasing shortness of breath and returned to the ED on 2/25. Did have a cough but no fever or chills. His CXr had worsened. He was started on IV ceftriaxone and azithromycin and admitted. Repeat CT yesterday showed a large loculated left pleural effusion.   He currently has some left sided pain, controlled with medications. Mild shortness of breath but on room air and in no distress.  Past Medical History:  Diagnosis Date  . Allergy   . Anxiety   . Arthritis   . Hyperlipidemia   . Metastatic cancer to bone ([place])    left rib.  Marland Kitchen Neoplasm of face    benign  . Prostate cancer ([place])   . Sleep apnea   . Ulcerative colitis     Past Surgical History:  Procedure Laterality Date  . INGUINAL HERNIA REPAIR    . PROSTATECTOMY  04/2015  . SHOULDER ARTHROTOMY Right     Family History  Problem Relation Age of Onset  . Heart attack Father   . Hyperlipidemia    . Diabetes    . Thyroid disease    . Lung cancer    . Breast cancer    . Colon cancer    . Juvenile idiopathic arthritis Daughter   . Raynaud syndrome Daughter     Social History:  reports that he has quit smoking. He has never used smokeless tobacco. He reports that he drinks alcohol. He reports that he does not use drugs.  Allergies:  Allergies  Allergen Reactions  . Sulfamethoxazole     REACTION: unspecified    Medications:  Scheduled: . azithromycin  500 mg Oral Q24H  . balsalazide  750 mg Oral BID  . cefTRIAXone (ROCEPHIN)  IV  1 g Intravenous Q24H  . docusate  sodium  100 mg Oral BID  . enoxaparin (LOVENOX) injection  40 mg Subcutaneous Q24H  . ferrous sulfate  325 mg Oral Q breakfast  . simvastatin  20 mg Oral QPC supper  . sodium chloride flush  3 mL Intravenous Q12H  . [START ON 09/06/2016] vancomycin  1,000 mg Intravenous On Call to OR    Results for orders placed or performed during the hospital encounter of 09/02/16 (from the past 48 hour(s))  CBC     Status: Abnormal   Collection Time: 09/04/16  7:18 AM  Result Value Ref Range   WBC 16.8 (H) 4.0 - 10.5 K/uL   RBC 3.35 (L) 4.22 - 5.81 MIL/uL   Hemoglobin 10.2 (L) 13.0 - 17.0 g/dL   HCT 31.3 (L) 39.0 - 52.0 %   MCV 93.4 78.0 - 100.0 fL   MCH 30.4 26.0 - 34.0 pg   MCHC 32.6 30.0 - 36.0 g/dL   RDW 12.6 11.5 - 15.5 %   Platelets 402 (H) 150 - 400 K/uL  Respiratory Panel by PCR     Status: None   Collection Time: 09/04/16 11:29 AM  Result Value Ref Range   Adenovirus NOT DETECTED NOT DETECTED   Coronavirus 229E NOT DETECTED NOT DETECTED  Coronavirus HKU1 NOT DETECTED NOT DETECTED   Coronavirus NL63 NOT DETECTED NOT DETECTED   Coronavirus OC43 NOT DETECTED NOT DETECTED   Metapneumovirus NOT DETECTED NOT DETECTED   Rhinovirus / Enterovirus NOT DETECTED NOT DETECTED   Influenza A NOT DETECTED NOT DETECTED   Influenza B NOT DETECTED NOT DETECTED   Parainfluenza Virus 1 NOT DETECTED NOT DETECTED   Parainfluenza Virus 2 NOT DETECTED NOT DETECTED   Parainfluenza Virus 3 NOT DETECTED NOT DETECTED   Parainfluenza Virus 4 NOT DETECTED NOT DETECTED   Respiratory Syncytial Virus NOT DETECTED NOT DETECTED   Bordetella pertussis NOT DETECTED NOT DETECTED   Chlamydophila pneumoniae NOT DETECTED NOT DETECTED   Mycoplasma pneumoniae NOT DETECTED NOT DETECTED  Influenza panel by PCR (type A & B)     Status: None   Collection Time: 09/04/16 11:29 AM  Result Value Ref Range   Influenza A By PCR NEGATIVE NEGATIVE   Influenza B By PCR NEGATIVE NEGATIVE    Comment: (NOTE) The Xpert Xpress Flu assay  is intended as an aid in the diagnosis of  influenza and should not be used as a sole basis for treatment.  This  assay is FDA approved for nasopharyngeal swab specimens only. Nasal  washings and aspirates are unacceptable for Xpert Xpress Flu testing.   Procalcitonin - Baseline     Status: None   Collection Time: 09/04/16 12:10 PM  Result Value Ref Range   Procalcitonin 0.35 ng/mL    Comment:        Interpretation: PCT (Procalcitonin) <= 0.5 ng/mL: Systemic infection (sepsis) is not likely. Local bacterial infection is possible. (NOTE)         ICU PCT Algorithm               Non ICU PCT Algorithm    ----------------------------     ------------------------------         PCT < 0.25 ng/mL                 PCT < 0.1 ng/mL     Stopping of antibiotics            Stopping of antibiotics       strongly encouraged.               strongly encouraged.    ----------------------------     ------------------------------       PCT level decrease by               PCT < 0.25 ng/mL       >= 80% from peak PCT       OR PCT 0.25 - 0.5 ng/mL          Stopping of antibiotics                                             encouraged.     Stopping of antibiotics           encouraged.    ----------------------------     ------------------------------       PCT level decrease by              PCT >= 0.25 ng/mL       < 80% from peak PCT        AND PCT >= 0.5 ng/mL  Continuin g antibiotics                                              encouraged.       Continuing antibiotics            encouraged.    ----------------------------     ------------------------------     PCT level increase compared          PCT > 0.5 ng/mL         with peak PCT AND          PCT >= 0.5 ng/mL             Escalation of antibiotics                                          strongly encouraged.      Escalation of antibiotics        strongly encouraged.   CBC     Status: Abnormal   Collection Time: 09/05/16  4:43 AM   Result Value Ref Range   WBC 17.2 (H) 4.0 - 10.5 K/uL   RBC 3.36 (L) 4.22 - 5.81 MIL/uL   Hemoglobin 10.5 (L) 13.0 - 17.0 g/dL   HCT 31.2 (L) 39.0 - 52.0 %   MCV 92.9 78.0 - 100.0 fL   MCH 31.3 26.0 - 34.0 pg   MCHC 33.7 30.0 - 36.0 g/dL   RDW 12.6 11.5 - 15.5 %   Platelets 447 (H) 150 - 400 K/uL  Basic metabolic panel     Status: Abnormal   Collection Time: 09/05/16  4:43 AM  Result Value Ref Range   Sodium 135 135 - 145 mmol/L   Potassium 3.6 3.5 - 5.1 mmol/L   Chloride 97 (L) 101 - 111 mmol/L   CO2 28 22 - 32 mmol/L   Glucose, Bld 101 (H) 65 - 99 mg/dL   BUN 12 6 - 20 mg/dL   Creatinine, Ser 0.80 0.61 - 1.24 mg/dL   Calcium 8.6 (L) 8.9 - 10.3 mg/dL   GFR calc non Af Amer >60 >60 mL/min   GFR calc Af Amer >60 >60 mL/min    Comment: (NOTE) The eGFR has been calculated using the CKD EPI equation. This calculation has not been validated in all clinical situations. eGFR's persistently <60 mL/min signify possible Chronic Kidney Disease.    Anion gap 10 5 - 15  Protime-INR     Status: None   Collection Time: 09/05/16  4:43 AM  Result Value Ref Range   Prothrombin Time 15.1 11.4 - 15.2 seconds   INR 1.18   APTT     Status: None   Collection Time: 09/05/16  4:43 AM  Result Value Ref Range   aPTT 35 24 - 36 seconds    Ct Chest Wo Contrast  Result Date: 09/04/2016 CLINICAL DATA:  Chest pain, shortness of breath. EXAM: CT CHEST WITHOUT CONTRAST TECHNIQUE: Multidetector CT imaging of the chest was performed following the standard protocol without IV contrast. COMPARISON:  Radiograph of same day.  CT scan of September 01, 2016. FINDINGS: Cardiovascular: 4.1 cm ascending thoracic aortic aneurysm is noted. Atherosclerosis is noted. Mediastinum/Nodes: No enlarged mediastinal or axillary lymph nodes. Thyroid gland, trachea, and esophagus demonstrate no significant findings.  Lungs/Pleura: There is interval development of large multiloculated pleural effusion in the left hemithorax.  Atelectasis of left lower lobe is noted. Mild right posterior basilar subsegmental atelectasis is noted. Upper Abdomen: No acute abnormality. Musculoskeletal: No chest wall mass or suspicious bone lesions identified. IMPRESSION: 4.1 cm ascending thoracic aortic aneurysm. Recommend annual imaging followup by CTA or MRA. This recommendation follows 2010 ACCF/AHA/AATS/ACR/ASA/SCA/SCAI/SIR/STS/SVM Guidelines for the Diagnosis and Management of Patients with Thoracic Aortic Disease. Circulation. 2010; 121: S970-Y637. Interval development of large multi loculated left pleural effusion. Left lower lobe atelectasis is noted. Electronically Signed   By: Marijo Conception, M.D.   On: 09/04/2016 19:17   Dg Chest Port 1 View  Result Date: 09/04/2016 CLINICAL DATA:  61 year old male with increasing shortness breath. Metastatic prostate cancer. Prior smoker. Subsequent encounter. EXAM: PORTABLE CHEST 1 VIEW COMPARISON:  09/02/2016 chest x-ray.  09/01/2016 CT. FINDINGS: Almost complete consolidation left thorax suggesting enlarging pleural effusion. Only small portion of the left upper lobe remains aerated. Shunting of blood to right lung. Right apical pleural thickening without associated bony destruction. Heart appears enlarged. Metastatic lesion left sixth rib better appreciated on CT. IMPRESSION: Almost complete consolidation left thorax suggesting enlarging pleural effusion. Only small portion of the left upper lobe remains aerated. These results will be called to the ordering clinician or representative by the Radiologist Assistant, and communication documented in the PACS or zVision Dashboard. Electronically Signed   By: Genia Del M.D.   On: 09/04/2016 12:06    Review of Systems  Constitutional: Positive for malaise/fatigue. Negative for chills and fever.  Respiratory: Positive for cough and shortness of breath.   Cardiovascular: Positive for chest pain (left sided) and orthopnea.  Gastrointestinal: Negative  for blood in stool, nausea and vomiting.  Genitourinary: Negative for dysuria and urgency.       History of prostatectomy  Musculoskeletal: Positive for back pain and joint pain.  Neurological: Positive for weakness. Negative for focal weakness and loss of consciousness.  Endo/Heme/Allergies: Positive for environmental allergies. Does not bruise/bleed easily.  All other systems reviewed and are negative.  Blood pressure 128/66, pulse 91, temperature 99.6 F (37.6 C), temperature source Oral, resp. rate 18, height '5\' 6"'$  (1.676 m), weight 174 lb 13.2 oz (79.3 kg), SpO2 95 %. Physical Exam  Vitals reviewed. Constitutional: He is oriented to person, place, and time. He appears well-developed and well-nourished. No distress.  HENT:  Head: Normocephalic and atraumatic.  Eyes: Conjunctivae and EOM are normal. No scleral icterus.  Neck: No thyromegaly present.  Cardiovascular: Normal rate, regular rhythm and normal heart sounds.   No murmur heard. Respiratory: Effort normal. No respiratory distress. He has no wheezes.  Absent BS on left  GI: Soft. He exhibits no distension. There is no tenderness.  Musculoskeletal: He exhibits no edema.  Lymphadenopathy:    He has no cervical adenopathy.  Neurological: He is alert and oriented to person, place, and time. No cranial nerve deficit.  Skin: Skin is warm and dry.    Assessment/Plan: 61 yo man who presents with pleuritic chest pain and shortness of breath. He has a left lower lobe pneumonia with an early organizing empyema that has developed rapidly while in the hospital on IV antibiotics. Bronchoscopy and left VATS to drain the empyema and decorticate the lung are indicated for relief of symptoms and to prevent loss of pulmonary function.  I have discussed the general nature of the procedure, the need for general anesthesia, the incisions to be used and the  need for drainage tubes postoperatively with Mr. Mcbreen and his children. We discussed the  expected hospital stay, overall recovery and short and long term outcomes. I informed them of the indications, risks, benefits and alternatives. They understand the risks include, but are not limited to death, stroke, MI, DVT/PE, bleeding, possible need for transfusion, infections, prolonged air leak, cardiac arrhythmias, as well as other organ system dysfunction including respiratory, renal, or GI complications.   He accepts the risks and agrees to proceed.  [name] 09/05/2016, 2:59 PM

## 2016-09-05 NOTE — Progress Notes (Addendum)
Name: [name] MRN: [id number] DOB: 03-22-1956    ADMISSION DATE:  09/02/2016 CONSULTATION DATE:  09/04/2016 REFERRING MD :  Dr. Carolin Sicks [facility]  CHIEF COMPLAINT:  L rib pain   SUBJECTIVE:  Up in chair no distress.   VITAL SIGNS: Temp:  [98.2 F (36.8 C)-99.6 F (37.6 C)] 99.6 F (37.6 C) (02/28 0432) Pulse Rate:  [86-92] 91 (02/28 0432) Resp:  [18-20] 18 (02/28 0432) BP: (128-131)/(63-66) 128/66 (02/28 0432) SpO2:  [95 %-96 %] 95 % (02/28 0432) Weight:  [174 lb 12.8 oz (79.3 kg)-174 lb 13.2 oz (79.3 kg)] 174 lb 13.2 oz (79.3 kg) (02/28 0500)  General appearance:  61 Year old  Male, well nourished,  NAD,d,  conversant  Eyes: anicteric sclerae icteric , moist conjunctivae; PERRL, EOMI bilaterally. Mouth:  membranes and no mucosal ulcerations; normal hard and soft palate Neck: Trachea midline; neck supple, no JVD Lungs/chest: CTA, decreased on left. with normal respiratory effort and no intercostal retractions CV: RRR, no MRGs  Abdomen: Soft, non-tender; no masses or HSM Extremities: No peripheral edema or extremity lymphadenopathy Skin: Normal temperature, turgor and texture; no rash, ulcers or subcutaneous nodules Psych: Appropriate affect, alert and oriented to person, place and time  Recent Labs Lab 09/02/16 1305 09/03/16 0220 09/05/16 0443  NA 134* 136 135  K 4.3 4.0 3.6  CL 99* 100* 97*  CO2 27 26 28   BUN 23* 17 12  CREATININE 0.87 0.88 0.80  GLUCOSE 104* 138* 101*   Recent Labs Lab 09/03/16 0220 09/04/16 0718 09/05/16 0443  HGB 11.6* 10.2* 10.5*  HCT 34.9*  30.1* 31.3* 31.2*  WBC 15.3* 16.8* 17.2*  PLT 412* 402* 447*   Ct Chest Wo Contrast  Result Date: 09/04/2016 CLINICAL DATA:  Chest pain, shortness of breath. EXAM: CT CHEST WITHOUT CONTRAST TECHNIQUE: Multidetector CT imaging of the chest was performed following the standard protocol without IV contrast. COMPARISON:  Radiograph of same day.  CT scan of September 01, 2016. FINDINGS:  Cardiovascular: 4.1 cm ascending thoracic aortic aneurysm is noted. Atherosclerosis is noted. Mediastinum/Nodes: No enlarged mediastinal or axillary lymph nodes. Thyroid gland, trachea, and esophagus demonstrate no significant findings. Lungs/Pleura: There is interval development of large multiloculated pleural effusion in the left hemithorax. Atelectasis of left lower lobe is noted. Mild right posterior basilar subsegmental atelectasis is noted. Upper Abdomen: No acute abnormality. Musculoskeletal: No chest wall mass or suspicious bone lesions identified. IMPRESSION: 4.1 cm ascending thoracic aortic aneurysm. Recommend annual imaging followup by CTA or MRA. This recommendation follows 2010 ACCF/AHA/AATS/ACR/ASA/SCA/SCAI/SIR/STS/SVM Guidelines for the Diagnosis and Management of Patients with Thoracic Aortic Disease. Circulation. 2010; [id number]. Interval development of large multi loculated left pleural effusion. Left lower lobe atelectasis is noted. Electronically Signed   By: Marijo Conception, M.D.   On: 09/04/2016 19:17   Dg Chest Port 1 View  Result Date: 09/04/2016 CLINICAL DATA:  61 year old male with increasing shortness breath. Metastatic prostate cancer. Prior smoker. Subsequent encounter. EXAM: PORTABLE CHEST 1 VIEW COMPARISON:  09/02/2016 chest x-ray.  09/01/2016 CT. FINDINGS: Almost complete consolidation left thorax suggesting enlarging pleural effusion. Only small portion of the left upper lobe remains aerated. Shunting of blood to right lung. Right apical pleural thickening without associated bony destruction. Heart appears enlarged. Metastatic lesion left sixth rib better appreciated on CT. IMPRESSION: Almost complete consolidation left thorax suggesting enlarging pleural effusion. Only small portion of the left upper lobe remains aerated. These results will be called to the ordering clinician or representative by the  Psychologist, clinical, and communication documented in the PACS or zVision  Dashboard. Electronically Signed   By: Genia Del M.D.   On: 09/04/2016 12:06    ASSESSMENT / PLAN:  CAP w/ complex loculated left effusion  Plan VATs per CVTS Cont ABX PCCM s/o call PRN  Erick Colace ACNP-BC [place] Pager # [phone] OR # [phone] if no answer  09/05/2016 12:57 PM  Attending Note:  61 year old male with prostate cancer history how presents with loculated whiteout on the left and was concerned for mass.  CT of the chest that I reviewed myself reveals loculated pleural effusions.  Bronch was cancelled and CVTS is to see patient.  Evaluated by Dr. Roxan Hockey who will take patient to the OR in AM for VATS.  On exam, decreased BS on the left with normal BS on the right.  Discussed with PCCM-NP.  Whiteout: due to loculated pleural effusion no airway obstruction noted  - Cancel bronch  Loculated pleural effusion:  - VATS in AM  - Hope to get cultures and cytology from that as well.  PNA:  - Rocephin  - Zithromax  - Vanc  - F/u on cultures  Hypoxemia: Patient is concerned about improvement in breathing  - Titrate O2 for sat of 88-92%  - IS  - Flutter valve  - PT post op  PCCM will sign off, please call back if needed.  Patient seen and examined, agree with above note.  I dictated the care and orders written for this patient under my direction.  Rush Farmer, MD [phone]

## 2016-09-05 NOTE — Progress Notes (Addendum)
PROGRESS NOTE    [name]  [id number] DOB: 1956/06/05 DOA: 09/02/2016 PCP: Marylene Land, MD   Brief Narrative: 61 y.o. male with a past medical history significant for prostate cancer, metastatic to bone, stable, and ulcerative colitis who presents with cough, chest pain. Patient was transferred from [place] [facility] for left lower lobe pneumonia and small left pleural effusion.   Assessment & Plan:  # Left lower lobe pneumonia with large multi-loculated left pleural effusion: -IR unable to drain the fluid due to minimal complex fluid on admission. The repeat Xray and CT chest with expanding pleural effusion. Evaluated by pulmonologist who initially thought maybe doing bronchoscopy today. I discussed with Dr. Ashok Cordia this morning who thought that patient needs cardiothoracic surgery consult and they will not do bronchoscopy today. I consulted cardiothoracic surgeon and discussed with Dr.Hendrickson. He said the patient will be seen today but there will be no procedure done today therefore patient doesn't need to be NPO. I again called and discussed with Dr. Ashok Cordia. He said since there is no plan for bronchoscopy today patient can eat and patient will be followed by pulmonary team. I will discontinue NPO order and order diet after discussion with consultants. -Patient is requiring 2 L of oxygen for possible acute hypoxic respiratory failure, no hypoxia isn't recorded. Patient remains clinically stable. Pain is controlled with the current regimen. -Continue azithromycin and ceftriaxone. Cultures are negative so far. Monitor leukocytosis. -Influenza, respiratory viral studies and HIV negative. -I discussed above with the patient and his daughter at bedside in detail.  #Mild elevation in troponin on admission: Repeat troponin negative. ACS less likely.  #History of prostate cancer: Recommended to follow-up with oncologist outpatient. -Continue current pain management. He is also  on MiraLAX as needed.  #History of ulcerative colitis: Continue Colazal  # Normocytic anemia: Likely iron deficiency. Continue oral iron. Monitor CBC.  Principal Problem:   Community acquired pneumonia of left lower lobe of lung ([place]) Active Problems:   Prostate cancer ([place])   Ulcerative colitis, unspecified, without complications (HCC)   Pleural effusion, left   Normocytic anemia  DVT prophylaxis: I will start Lovenox since there is no plan for procedure today. May need to hold if surgeon decides any procedures. Code Status: Full code Family Communication: Patient's daughter at bedside Disposition Plan: Likely discharge home in 2-3 days  Consultants:   Interventional radiologist  Pulmonologist  Cardiothoracic surgeon.  Procedures: Attempted ultrasound-guided thoracocentesis Antimicrobials: Ceftriaxone and azithromycin since February 25  Subjective: Patient was seen and examined at bedside. No significant clinical improvement. The pain is stable with current medication. Denied shortness of breath. Cough is stable. Denied fever or chills, nausea vomiting or abdominal pain. Objective: Vitals:   09/04/16 1830 09/04/16 2235 09/05/16 0432 09/05/16 0500  BP: 128/66 131/63 128/66   Pulse: 86 92 91   Resp: 20 18 18    Temp: 98.2 F (36.8 C) 99.4 F (37.4 C) 99.6 F (37.6 C)   TempSrc: Oral Oral Oral   SpO2: 96% 95% 95%   Weight:  79.3 kg (174 lb 12.8 oz)  79.3 kg (174 lb 13.2 oz)  Height:        Intake/Output Summary (Last 24 hours) at 09/05/16 1100 Last data filed at 09/05/16 0900  Gross per 24 hour  Intake              280 ml  Output              900 ml  Net             -620 ml   Filed Weights   09/03/16 2216 09/04/16 2235 09/05/16 0500  Weight: 80.1 kg (176 lb 9.4 oz) 79.3 kg (174 lb 12.8 oz) 79.3 kg (174 lb 13.2 oz)    Examination:  General exam: Sitting on chair comfortable, not in distress Respiratory system: Decreased breath sound on the left side, no  wheezing. Respiratory effort normal. Cardiovascular system: Regular rate rhythm, S1 and S2 normal. No pedal edema. Gastrointestinal system: Abdomen soft, nontender, nondistended. Bowel sound positive Central nervous system: Alert, awake, oriented. No focal neurological deficit  Extremities: Symmetric 5 x 5 power. Skin: No rashes, lesions or ulcers Psychiatry: Judgement and insight appear normal. Mood & affect appropriate.     Data Reviewed: I have personally reviewed following labs and imaging studies  CBC:  Recent Labs Lab 09/01/16 0650 09/02/16 1305 09/03/16 0220 09/04/16 0718 09/05/16 0443  WBC 11.6* 14.7* 15.3* 16.8* 17.2*  NEUTROABS 9.6* 12.3*  --   --   --   HGB 12.1* 11.3* 11.6* 10.2* 10.5*  HCT 36.2* 33.7* 34.9*  30.1* 31.3* 31.2*  MCV 95.0 94.9 93.3 93.4 92.9  PLT 417* 419* 412* 402* [id number]*   Basic Metabolic Panel:  Recent Labs Lab 09/01/16 0650 09/02/16 1305 09/03/16 0220 09/05/16 0443  NA 137 134* 136 135  K 4.1 4.3 4.0 3.6  CL 103 99* 100* 97*  CO2 26 27 26 28   GLUCOSE 128* 104* 138* 101*  BUN 25* 23* 17 12  CREATININE 1.05 0.87 0.88 0.80  CALCIUM 8.9 8.9 8.7* 8.6*   GFR: Estimated Creatinine Clearance: 97.2 mL/min (by C-G formula based on SCr of 0.8 mg/dL). Liver Function Tests:  Recent Labs Lab 09/02/16 1305 09/03/16 0220  AST 14* 24  ALT 18 27  ALKPHOS 66 91  BILITOT 0.6 0.3  PROT 7.3 6.6  ALBUMIN 3.3* 3.0*    Recent Labs Lab 09/02/16 1305  LIPASE 16   No results for input(s): AMMONIA in the last 168 hours. Coagulation Profile:  Recent Labs Lab 09/05/16 0443  INR 1.18   Cardiac Enzymes:  Recent Labs Lab 09/02/16 1305 09/02/16 2003  TROPONINI 0.03* <0.03   BNP (last 3 results) No results for input(s): PROBNP in the last 8760 hours. HbA1C: No results for input(s): HGBA1C in the last 72 hours. CBG: No results for input(s): GLUCAP in the last 168 hours. Lipid Profile: No results for input(s): CHOL, HDL, LDLCALC, TRIG,  CHOLHDL, LDLDIRECT in the last 72 hours. Thyroid Function Tests:  Recent Labs  09/03/16 0220  TSH 1.704   Anemia Panel:  Recent Labs  09/03/16 0220  VITAMINB12 777  FERRITIN 764*  TIBC 203*  IRON 13*   Sepsis Labs:  Recent Labs Lab 09/02/16 1409 09/04/16 1210  PROCALCITON  --  0.35  LATICACIDVEN 1.55  --     Recent Results (from the past 240 hour(s))  MRSA PCR Screening     Status: None   Collection Time: 09/02/16  7:01 PM  Result Value Ref Range Status   MRSA by PCR NEGATIVE NEGATIVE Final    Comment:        The GeneXpert MRSA Assay (FDA approved for NASAL specimens only), is one component of a comprehensive MRSA colonization surveillance program. It is not intended to diagnose MRSA infection nor to guide or monitor treatment for MRSA infections.   Respiratory Panel by PCR     Status: None   Collection Time: 09/04/16 11:29  AM  Result Value Ref Range Status   Adenovirus NOT DETECTED NOT DETECTED Final   Coronavirus 229E NOT DETECTED NOT DETECTED Final   Coronavirus HKU1 NOT DETECTED NOT DETECTED Final   Coronavirus NL63 NOT DETECTED NOT DETECTED Final   Coronavirus OC43 NOT DETECTED NOT DETECTED Final   Metapneumovirus NOT DETECTED NOT DETECTED Final   Rhinovirus / Enterovirus NOT DETECTED NOT DETECTED Final   Influenza A NOT DETECTED NOT DETECTED Final   Influenza B NOT DETECTED NOT DETECTED Final   Parainfluenza Virus 1 NOT DETECTED NOT DETECTED Final   Parainfluenza Virus 2 NOT DETECTED NOT DETECTED Final   Parainfluenza Virus 3 NOT DETECTED NOT DETECTED Final   Parainfluenza Virus 4 NOT DETECTED NOT DETECTED Final   Respiratory Syncytial Virus NOT DETECTED NOT DETECTED Final   Bordetella pertussis NOT DETECTED NOT DETECTED Final   Chlamydophila pneumoniae NOT DETECTED NOT DETECTED Final   Mycoplasma pneumoniae NOT DETECTED NOT DETECTED Final         Radiology Studies: Ct Chest Wo Contrast  Result Date: 09/04/2016 CLINICAL DATA:  Chest pain,  shortness of breath. EXAM: CT CHEST WITHOUT CONTRAST TECHNIQUE: Multidetector CT imaging of the chest was performed following the standard protocol without IV contrast. COMPARISON:  Radiograph of same day.  CT scan of September 01, 2016. FINDINGS: Cardiovascular: 4.1 cm ascending thoracic aortic aneurysm is noted. Atherosclerosis is noted. Mediastinum/Nodes: No enlarged mediastinal or axillary lymph nodes. Thyroid gland, trachea, and esophagus demonstrate no significant findings. Lungs/Pleura: There is interval development of large multiloculated pleural effusion in the left hemithorax. Atelectasis of left lower lobe is noted. Mild right posterior basilar subsegmental atelectasis is noted. Upper Abdomen: No acute abnormality. Musculoskeletal: No chest wall mass or suspicious bone lesions identified. IMPRESSION: 4.1 cm ascending thoracic aortic aneurysm. Recommend annual imaging followup by CTA or MRA. This recommendation follows 2010 ACCF/AHA/AATS/ACR/ASA/SCA/SCAI/SIR/STS/SVM Guidelines for the Diagnosis and Management of Patients with Thoracic Aortic Disease. Circulation. 2010; [id number]. Interval development of large multi loculated left pleural effusion. Left lower lobe atelectasis is noted. Electronically Signed   By: Marijo Conception, M.D.   On: 09/04/2016 19:17   Dg Chest Port 1 View  Result Date: 09/04/2016 CLINICAL DATA:  61 year old male with increasing shortness breath. Metastatic prostate cancer. Prior smoker. Subsequent encounter. EXAM: PORTABLE CHEST 1 VIEW COMPARISON:  09/02/2016 chest x-ray.  09/01/2016 CT. FINDINGS: Almost complete consolidation left thorax suggesting enlarging pleural effusion. Only small portion of the left upper lobe remains aerated. Shunting of blood to right lung. Right apical pleural thickening without associated bony destruction. Heart appears enlarged. Metastatic lesion left sixth rib better appreciated on CT. IMPRESSION: Almost complete consolidation left thorax  suggesting enlarging pleural effusion. Only small portion of the left upper lobe remains aerated. These results will be called to the ordering clinician or representative by the Radiologist Assistant, and communication documented in the PACS or zVision Dashboard. Electronically Signed   By: Genia Del M.D.   On: 09/04/2016 12:06        Scheduled Meds: . azithromycin  500 mg Oral Q24H  . balsalazide  750 mg Oral BID  . cefTRIAXone (ROCEPHIN)  IV  1 g Intravenous Q24H  . ferrous sulfate  325 mg Oral Q breakfast  . simvastatin  20 mg Oral QPC supper  . sodium chloride flush  3 mL Intravenous Q12H   Continuous Infusions:    LOS: 3 days    Deziyah Arvin Tanna Furry, MD [facility] Pager [phone]  If 7PM-7AM, please contact  night-coverage [email or web address] Password [email or web address] 09/05/2016, 11:00 AM

## 2016-09-05 NOTE — Progress Notes (Signed)
Occupational Therapy Treatment Patient Details Name: [name] MRN: [id number] DOB: [birth year] Today's Date: 09/05/2016    History of present illness Pt admitted with CAP and L pleural effusion. PMH: prostate cancer with bone mets to ribs, ulcerative colitis.   OT comments  Pt demonstrates decreased activity tolerance and endurance during ADLs. During grooming at sink pt Sp02 decreased: Sp02 98 sitting on 2L [place]; 92 standing at sink on RM; 82 seated on RM. Provided 2L [place] and pt returned to 96 Sp02. Pt benefits from Min guard A during ADLs for safety. Pt verbalized feeling weak stating " I just feel winded." Pt educated on purse lip breathing; pt verbalized understanding. Pt denies dizziness. Will continued skilled acute OT to increase endurance during ADLs and functional mobility.   Follow Up Recommendations  Home health OT;Supervision/Assistance - 24 hour    Equipment Recommendations  3 in 1 bedside commode    Recommendations for Other Services      Precautions / Restrictions Precautions Precautions: Fall Precaution Comments: watch 02 Restrictions Weight Bearing Restrictions: No       Mobility Bed Mobility               General bed mobility comments: In recliner upon arrival  Transfers Overall transfer level: Needs assistance Equipment used: None Transfers: Sit to/from Stand Sit to Stand: Min guard         General transfer comment: guard for safety    Balance Overall balance assessment: Needs assistance Sitting-balance support: Feet supported Sitting balance-Leahy Scale: Good     Standing balance support: No upper extremity supported;During functional activity Standing balance-Leahy Scale: Fair Standing balance comment: Min guard for safety                   ADL Overall ADL's : Needs assistance/impaired     Grooming: Wash/dry face;Brushing hair;Standing (~7 min)           Upper Body Dressing : Standing;Set up;Supervision/safety                    Functional mobility during ADLs: Min guard General ADL Comments: Pt 02 (RM) decrease during grooming at sink, 98>92>82      Vision                     Perception     Praxis      Cognition   Behavior During Therapy: [facility] for tasks assessed/performed Overall Cognitive Status: Within Functional Limits for tasks assessed                         Exercises     Shoulder Instructions       General Comments      Pertinent Vitals/ Pain       Pain Assessment: Faces Faces Pain Scale: Hurts little more Pain Location: L ribs Pain Descriptors / Indicators: Aching Pain Intervention(s): Monitored during session  Home Living                                          Prior Functioning/Environment              Frequency  Min 2X/week        Progress Toward Goals  OT Goals(current goals can now be found in the care plan section)     Acute Rehab OT Goals  Patient Stated Goal: to return home to his dog OT Goal Formulation: With patient Time For Goal Achievement: 09/17/16 Potential to Achieve Goals: Good ADL Goals Pt Will Perform Grooming: with supervision;standing Pt Will Perform Lower Body Bathing: with supervision;sit to/from stand Pt Will Perform Lower Body Dressing: with supervision;sit to/from stand Pt Will Transfer to Toilet: with supervision;ambulating;regular height toilet Pt Will Perform Toileting - Clothing Manipulation and hygiene: with supervision;sit to/from stand Pt Will Perform Tub/Shower Transfer: Shower transfer;ambulating;3 in 1;with supervision Additional ADL Goal #1: Pt will state at least 3 energy conservation strategies and utilize purse lip breathing during ADL and mobility.  Plan Discharge plan remains appropriate    Co-evaluation                 End of Session Equipment Utilized During Treatment: Oxygen  OT Visit Diagnosis: Unsteadiness on feet (R26.81);Muscle weakness (generalized)  (M62.81);Pain Pain - Right/Left: Left Pain - part of body:  (Ribs)   Activity Tolerance Patient limited by fatigue   Patient Left in chair;with call bell/phone within reach;with family/visitor present   Nurse Communication Mobility status;Other (comment) (Pt requests to walk with 02 later today)        Time: [id number] OT Time Calculation (min): 21 min  Charges: OT General Charges $OT Visit: 1 Procedure OT Treatments $Self Care/Home Management : 8-22 mins  [place], OTR/L [place] 09/05/2016, 2:19 PM

## 2016-09-06 ENCOUNTER — Encounter (HOSPITAL_COMMUNITY): Payer: Self-pay | Admitting: Anesthesiology

## 2016-09-06 ENCOUNTER — Inpatient Hospital Stay (HOSPITAL_COMMUNITY): Payer: BLUE CROSS/BLUE SHIELD | Admitting: Anesthesiology

## 2016-09-06 ENCOUNTER — Inpatient Hospital Stay (HOSPITAL_COMMUNITY): Payer: BLUE CROSS/BLUE SHIELD

## 2016-09-06 ENCOUNTER — Encounter (HOSPITAL_COMMUNITY)
Admission: EM | Disposition: A | Discharge status: Home Health | Payer: Self-pay | Source: Home / Self Care | Attending: Thoracic Surgery (Cardiothoracic Vascular Surgery)

## 2016-09-06 DIAGNOSIS — J869 Pyothorax without fistula: Secondary | ICD-10-CM | POA: Diagnosis present

## 2016-09-06 HISTORY — PX: VIDEO ASSISTED THORACOSCOPY (VATS)/EMPYEMA: SHX6172

## 2016-09-06 HISTORY — PX: VIDEO BRONCHOSCOPY: SHX5072

## 2016-09-06 HISTORY — PX: DECORTICATION: SHX5101

## 2016-09-06 LAB — GLUCOSE, CAPILLARY
GLUCOSE-CAPILLARY: 131 mg/dL — AB (ref 65–99)
GLUCOSE-CAPILLARY: 186 mg/dL — AB (ref 65–99)

## 2016-09-06 LAB — PROCALCITONIN: PROCALCITONIN: 0.46 ng/mL

## 2016-09-06 LAB — CBC
HCT: 31.7 % — ABNORMAL LOW (ref 39.0–52.0)
HEMOGLOBIN: 10.6 g/dL — AB (ref 13.0–17.0)
MCH: 31.1 pg (ref 26.0–34.0)
MCHC: 33.4 g/dL (ref 30.0–36.0)
MCV: 93 fL (ref 78.0–100.0)
Platelets: 441 10*3/uL — ABNORMAL HIGH (ref 150–400)
RBC: 3.41 MIL/uL — AB (ref 4.22–5.81)
RDW: 13 % (ref 11.5–15.5)
WBC: 15.1 10*3/uL — ABNORMAL HIGH (ref 4.0–10.5)

## 2016-09-06 LAB — SURGICAL PCR SCREEN
MRSA, PCR: NEGATIVE
Staphylococcus aureus: NEGATIVE

## 2016-09-06 SURGERY — VIDEO ASSISTED THORACOSCOPY (VATS)/EMPYEMA
Anesthesia: General | Site: Chest

## 2016-09-06 MED ORDER — ONDANSETRON HCL 4 MG/2ML IJ SOLN
INTRAMUSCULAR | Status: AC
  Filled 2016-09-06: qty 2

## 2016-09-06 MED ORDER — SUGAMMADEX SODIUM 200 MG/2ML IV SOLN
INTRAVENOUS | Status: DC | PRN
  Administered 2016-09-06: 160 mg via INTRAVENOUS

## 2016-09-06 MED ORDER — SENNOSIDES-DOCUSATE SODIUM 8.6-50 MG PO TABS
1.0000 | ORAL_TABLET | Freq: Every day | ORAL | Status: DC
  Administered 2016-09-07 – 2016-09-09 (×4): 1 via ORAL
  Filled 2016-09-06 (×5): qty 1

## 2016-09-06 MED ORDER — PHENYLEPHRINE HCL 10 MG/ML IJ SOLN
INTRAVENOUS | Status: DC | PRN
  Administered 2016-09-06: 20 ug/min via INTRAVENOUS

## 2016-09-06 MED ORDER — HYDROMORPHONE HCL 1 MG/ML IJ SOLN
INTRAMUSCULAR | Status: AC
  Filled 2016-09-06: qty 1

## 2016-09-06 MED ORDER — FENTANYL CITRATE (PF) 100 MCG/2ML IJ SOLN
INTRAMUSCULAR | Status: AC
  Filled 2016-09-06: qty 2

## 2016-09-06 MED ORDER — SODIUM CHLORIDE 0.9% FLUSH
9.0000 mL | INTRAVENOUS | Status: DC | PRN
  Administered 2016-09-11: 9 mL via INTRAVENOUS
  Filled 2016-09-06: qty 9

## 2016-09-06 MED ORDER — PROMETHAZINE HCL 25 MG/ML IJ SOLN: 6.2500 mg | INTRAMUSCULAR | Status: DC | PRN

## 2016-09-06 MED ORDER — INSULIN ASPART 100 UNIT/ML ~~LOC~~ SOLN
0.0000 [IU] | Freq: Four times a day (QID) | SUBCUTANEOUS | Status: DC
  Administered 2016-09-07 (×2): 4 [IU] via SUBCUTANEOUS

## 2016-09-06 MED ORDER — FENTANYL CITRATE (PF) 100 MCG/2ML IJ SOLN
INTRAMUSCULAR | Status: AC
  Filled 2016-09-06: qty 4

## 2016-09-06 MED ORDER — ALBUMIN HUMAN 5 % IV SOLN
INTRAVENOUS | Status: DC | PRN
  Administered 2016-09-06: 18:00:00 via INTRAVENOUS

## 2016-09-06 MED ORDER — DIPHENHYDRAMINE HCL 50 MG/ML IJ SOLN
12.5000 mg | Freq: Four times a day (QID) | INTRAMUSCULAR | Status: DC | PRN
  Filled 2016-09-06: qty 0.25

## 2016-09-06 MED ORDER — ACETAMINOPHEN 160 MG/5ML PO SOLN
1000.0000 mg | Freq: Four times a day (QID) | ORAL | Status: DC
  Administered 2016-09-11: 1000 mg via ORAL
  Filled 2016-09-06: qty 40.6

## 2016-09-06 MED ORDER — DEXAMETHASONE SODIUM PHOSPHATE 10 MG/ML IJ SOLN
INTRAMUSCULAR | Status: DC | PRN
  Administered 2016-09-06: 10 mg via INTRAVENOUS

## 2016-09-06 MED ORDER — FENTANYL 40 MCG/ML IV SOLN
INTRAVENOUS | Status: DC
  Administered 2016-09-07: 150 ug via INTRAVENOUS
  Administered 2016-09-07: 1000 ug via INTRAVENOUS
  Administered 2016-09-07: 50 ug via INTRAVENOUS
  Administered 2016-09-07: 275 ug via INTRAVENOUS
  Administered 2016-09-07: 260 ug via INTRAVENOUS
  Administered 2016-09-07: 150 ug via INTRAVENOUS
  Administered 2016-09-08: 165 ug via INTRAVENOUS
  Administered 2016-09-08: 150 ug via INTRAVENOUS
  Administered 2016-09-08: 210 ug via INTRAVENOUS
  Administered 2016-09-08: 18:00:00 via INTRAVENOUS
  Administered 2016-09-08: 105 ug via INTRAVENOUS
  Administered 2016-09-08: 30 ug via INTRAVENOUS
  Administered 2016-09-08: 180 ug via INTRAVENOUS
  Administered 2016-09-09: 75 ug via INTRAVENOUS
  Administered 2016-09-09: 150 ug via INTRAVENOUS
  Administered 2016-09-09: 0 ug via INTRAVENOUS
  Administered 2016-09-09: 180 ug via INTRAVENOUS
  Administered 2016-09-09: 270 ug via INTRAVENOUS
  Administered 2016-09-09: 30 ug via INTRAVENOUS
  Administered 2016-09-09: 195 ug via INTRAVENOUS
  Administered 2016-09-09: 1000 ug via INTRAVENOUS
  Administered 2016-09-09: 180 ug via INTRAVENOUS
  Administered 2016-09-10: 60 ug via INTRAVENOUS
  Administered 2016-09-10: 105 ug via INTRAVENOUS
  Administered 2016-09-10 (×2): 150 ug via INTRAVENOUS
  Administered 2016-09-10: 120 ug via INTRAVENOUS
  Administered 2016-09-10: 270 ug via INTRAVENOUS
  Administered 2016-09-11: 0 ug via INTRAVENOUS
  Administered 2016-09-11: 30 ug via INTRAVENOUS
  Filled 2016-09-06 (×4): qty 25

## 2016-09-06 MED ORDER — ONDANSETRON HCL 4 MG/2ML IJ SOLN: 4.0000 mg | Freq: Four times a day (QID) | INTRAMUSCULAR | Status: DC | PRN

## 2016-09-06 MED ORDER — MIDAZOLAM HCL 2 MG/2ML IJ SOLN
INTRAMUSCULAR | Status: AC
  Filled 2016-09-06: qty 2

## 2016-09-06 MED ORDER — ACETAMINOPHEN 500 MG PO TABS
1000.0000 mg | ORAL_TABLET | Freq: Four times a day (QID) | ORAL | Status: DC
  Administered 2016-09-07 – 2016-09-11 (×17): 1000 mg via ORAL
  Filled 2016-09-06 (×16): qty 2

## 2016-09-06 MED ORDER — LEVALBUTEROL HCL 0.63 MG/3ML IN NEBU: 0.6300 mg | INHALATION_SOLUTION | Freq: Three times a day (TID) | RESPIRATORY_TRACT | Status: DC | PRN

## 2016-09-06 MED ORDER — LIDOCAINE HCL (CARDIAC) 20 MG/ML IV SOLN
INTRAVENOUS | Status: DC | PRN
  Administered 2016-09-06: 60 mg via INTRAVENOUS

## 2016-09-06 MED ORDER — SODIUM CHLORIDE 0.9 % IV SOLN
30.0000 meq | Freq: Every day | INTRAVENOUS | Status: DC | PRN
  Filled 2016-09-06: qty 15

## 2016-09-06 MED ORDER — SUGAMMADEX SODIUM 200 MG/2ML IV SOLN
INTRAVENOUS | Status: AC
  Filled 2016-09-06: qty 2

## 2016-09-06 MED ORDER — ROCURONIUM BROMIDE 100 MG/10ML IV SOLN
INTRAVENOUS | Status: DC | PRN
  Administered 2016-09-06: 50 mg via INTRAVENOUS
  Administered 2016-09-06: 10 mg via INTRAVENOUS
  Administered 2016-09-06: 20 mg via INTRAVENOUS
  Administered 2016-09-06: 10 mg via INTRAVENOUS
  Administered 2016-09-06: 30 mg via INTRAVENOUS

## 2016-09-06 MED ORDER — LACTATED RINGERS IV SOLN
INTRAVENOUS | Status: DC | PRN
  Administered 2016-09-06: 14:00:00 via INTRAVENOUS

## 2016-09-06 MED ORDER — ONDANSETRON HCL 4 MG/2ML IJ SOLN
INTRAMUSCULAR | Status: DC | PRN
  Administered 2016-09-06: 4 mg via INTRAVENOUS

## 2016-09-06 MED ORDER — PROPOFOL 10 MG/ML IV BOLUS
INTRAVENOUS | Status: DC | PRN
  Administered 2016-09-06: 200 mg via INTRAVENOUS

## 2016-09-06 MED ORDER — TRAMADOL HCL 50 MG PO TABS
50.0000 mg | ORAL_TABLET | Freq: Four times a day (QID) | ORAL | Status: DC | PRN
  Administered 2016-09-06 – 2016-09-11 (×2): 50 mg via ORAL
  Filled 2016-09-06: qty 1
  Filled 2016-09-06: qty 2
  Filled 2016-09-06: qty 1

## 2016-09-06 MED ORDER — KCL IN DEXTROSE-NACL 20-5-0.45 MEQ/L-%-% IV SOLN
INTRAVENOUS | Status: AC
  Filled 2016-09-06: qty 1000

## 2016-09-06 MED ORDER — KCL IN DEXTROSE-NACL 20-5-0.9 MEQ/L-%-% IV SOLN
INTRAVENOUS | Status: DC
  Administered 2016-09-06: 100 mL via INTRAVENOUS
  Administered 2016-09-07 – 2016-09-08 (×2): via INTRAVENOUS
  Filled 2016-09-06 (×4): qty 1000

## 2016-09-06 MED ORDER — KETOROLAC TROMETHAMINE 15 MG/ML IJ SOLN
15.0000 mg | Freq: Four times a day (QID) | INTRAMUSCULAR | Status: DC | PRN
  Administered 2016-09-06 – 2016-09-11 (×18): 15 mg via INTRAVENOUS
  Filled 2016-09-06 (×18): qty 1

## 2016-09-06 MED ORDER — SUCCINYLCHOLINE CHLORIDE 200 MG/10ML IV SOSY
PREFILLED_SYRINGE | INTRAVENOUS | Status: AC
  Filled 2016-09-06: qty 10

## 2016-09-06 MED ORDER — HYDROMORPHONE HCL 1 MG/ML IJ SOLN
0.2500 mg | INTRAMUSCULAR | Status: DC | PRN
  Administered 2016-09-06 (×3): 0.5 mg via INTRAVENOUS

## 2016-09-06 MED ORDER — OXYCODONE HCL 5 MG PO TABS
ORAL_TABLET | ORAL | Status: AC
  Filled 2016-09-06: qty 2

## 2016-09-06 MED ORDER — PROPOFOL 10 MG/ML IV BOLUS
INTRAVENOUS | Status: AC
  Filled 2016-09-06: qty 20

## 2016-09-06 MED ORDER — BUPIVACAINE HCL 0.5 % IJ SOLN
INTRAMUSCULAR | Status: AC
  Filled 2016-09-06: qty 1

## 2016-09-06 MED ORDER — MEPERIDINE HCL 25 MG/ML IJ SOLN: 6.2500 mg | INTRAMUSCULAR | Status: DC | PRN

## 2016-09-06 MED ORDER — DEXAMETHASONE SODIUM PHOSPHATE 10 MG/ML IJ SOLN
INTRAMUSCULAR | Status: AC
  Filled 2016-09-06: qty 1

## 2016-09-06 MED ORDER — BUPIVACAINE HCL (PF) 0.5 % IJ SOLN
INTRAMUSCULAR | Status: AC
  Filled 2016-09-06: qty 30

## 2016-09-06 MED ORDER — OXYCODONE HCL 5 MG PO TABS
5.0000 mg | ORAL_TABLET | ORAL | Status: DC | PRN
  Administered 2016-09-06 – 2016-09-09 (×7): 10 mg via ORAL
  Filled 2016-09-06 (×6): qty 2

## 2016-09-06 MED ORDER — 0.9 % SODIUM CHLORIDE (POUR BTL) OPTIME
TOPICAL | Status: DC | PRN
  Administered 2016-09-06: 3000 mL

## 2016-09-06 MED ORDER — FENTANYL CITRATE (PF) 100 MCG/2ML IJ SOLN
INTRAMUSCULAR | Status: DC | PRN
  Administered 2016-09-06 (×4): 50 ug via INTRAVENOUS
  Administered 2016-09-06: 100 ug via INTRAVENOUS
  Administered 2016-09-06: 50 ug via INTRAVENOUS
  Administered 2016-09-06: 25 ug via INTRAVENOUS
  Administered 2016-09-06: 50 ug via INTRAVENOUS

## 2016-09-06 MED ORDER — BISACODYL 5 MG PO TBEC
10.0000 mg | DELAYED_RELEASE_TABLET | Freq: Every day | ORAL | Status: DC
  Administered 2016-09-07 – 2016-09-11 (×5): 10 mg via ORAL
  Filled 2016-09-06 (×5): qty 2

## 2016-09-06 MED ORDER — MIDAZOLAM HCL 5 MG/5ML IJ SOLN
INTRAMUSCULAR | Status: DC | PRN
  Administered 2016-09-06: 2 mg via INTRAVENOUS

## 2016-09-06 MED ORDER — LACTATED RINGERS IV SOLN: INTRAVENOUS | Status: DC

## 2016-09-06 MED ORDER — EPHEDRINE 5 MG/ML INJ
INTRAVENOUS | Status: AC
  Filled 2016-09-06: qty 10

## 2016-09-06 MED ORDER — NALOXONE HCL 0.4 MG/ML IJ SOLN: 0.4000 mg | INTRAMUSCULAR | Status: DC | PRN

## 2016-09-06 MED ORDER — DIPHENHYDRAMINE HCL 12.5 MG/5ML PO ELIX
12.5000 mg | ORAL_SOLUTION | Freq: Four times a day (QID) | ORAL | Status: DC | PRN
  Filled 2016-09-06: qty 5

## 2016-09-06 MED ORDER — VANCOMYCIN HCL IN DEXTROSE 1-5 GM/200ML-% IV SOLN
1000.0000 mg | Freq: Three times a day (TID) | INTRAVENOUS | Status: DC
  Administered 2016-09-06 – 2016-09-07 (×2): 1000 mg via INTRAVENOUS
  Filled 2016-09-06 (×3): qty 200

## 2016-09-06 MED ORDER — FENTANYL 40 MCG/ML IV SOLN
INTRAVENOUS | Status: DC
  Administered 2016-09-06: 1000 ug via INTRAVENOUS
  Filled 2016-09-06: qty 25

## 2016-09-06 MED ORDER — PANTOPRAZOLE SODIUM 40 MG PO TBEC
40.0000 mg | DELAYED_RELEASE_TABLET | Freq: Every day | ORAL | Status: DC
  Administered 2016-09-07 – 2016-09-11 (×5): 40 mg via ORAL
  Filled 2016-09-06 (×5): qty 1

## 2016-09-06 MED ORDER — ROCURONIUM BROMIDE 50 MG/5ML IV SOSY
PREFILLED_SYRINGE | INTRAVENOUS | Status: AC
  Filled 2016-09-06: qty 20

## 2016-09-06 MED ORDER — LACTATED RINGERS IV SOLN
INTRAVENOUS | Status: DC | PRN
  Administered 2016-09-06: 15:00:00 via INTRAVENOUS

## 2016-09-06 SURGICAL SUPPLY — 81 items
CANISTER SUCT 3000ML PPV (MISCELLANEOUS) ×8 IMPLANT
CATH KIT ON Q 5IN SLV (PAIN MANAGEMENT) IMPLANT
CATH THORACIC 28FR (CATHETERS) ×4 IMPLANT
CATH THORACIC 36FR (CATHETERS) IMPLANT
CATH THORACIC 36FR RT ANG (CATHETERS) IMPLANT
CLIP TI MEDIUM 6 (CLIP) ×4 IMPLANT
CONN ST 1/4X3/8  BEN (MISCELLANEOUS) ×2
CONN ST 1/4X3/8 BEN (MISCELLANEOUS) ×6 IMPLANT
CONN Y 3/8X3/8X3/8  BEN (MISCELLANEOUS) ×1
CONN Y 3/8X3/8X3/8 BEN (MISCELLANEOUS) ×3 IMPLANT
CONT SPEC 4OZ CLIKSEAL STRL BL (MISCELLANEOUS) ×8 IMPLANT
CONT SPECI 4OZ STER CLIK (MISCELLANEOUS) ×16 IMPLANT
DERMABOND ADVANCED (GAUZE/BANDAGES/DRESSINGS) ×1
DERMABOND ADVANCED .7 DNX12 (GAUZE/BANDAGES/DRESSINGS) ×3 IMPLANT
DRAIN CHANNEL 28F RND 3/8 FF (WOUND CARE) IMPLANT
DRAIN CHANNEL 32F RND 10.7 FF (WOUND CARE) ×8 IMPLANT
DRAPE LAPAROSCOPIC ABDOMINAL (DRAPES) ×4 IMPLANT
DRAPE WARM FLUID 44X44 (DRAPE) ×8 IMPLANT
ELECT BLADE 6.5 EXT (BLADE) ×4 IMPLANT
ELECT REM PT RETURN 9FT ADLT (ELECTROSURGICAL) ×4
ELECTRODE REM PT RTRN 9FT ADLT (ELECTROSURGICAL) ×3 IMPLANT
GAUZE SPONGE 4X4 12PLY STRL (GAUZE/BANDAGES/DRESSINGS) ×4 IMPLANT
GLOVE BIOGEL M 7.0 STRL (GLOVE) ×4 IMPLANT
GLOVE BIOGEL PI IND STRL 6.5 (GLOVE) ×12 IMPLANT
GLOVE BIOGEL PI INDICATOR 6.5 (GLOVE) ×4
GLOVE SURG SIGNA 7.5 PF LTX (GLOVE) ×12 IMPLANT
GOWN STRL REUS W/ TWL LRG LVL3 (GOWN DISPOSABLE) ×9 IMPLANT
GOWN STRL REUS W/ TWL XL LVL3 (GOWN DISPOSABLE) ×6 IMPLANT
GOWN STRL REUS W/TWL LRG LVL3 (GOWN DISPOSABLE) ×3
GOWN STRL REUS W/TWL XL LVL3 (GOWN DISPOSABLE) ×2
KIT BASIN OR (CUSTOM PROCEDURE TRAY) ×4 IMPLANT
KIT ROOM TURNOVER OR (KITS) ×4 IMPLANT
KIT SUCTION CATH 14FR (SUCTIONS) ×4 IMPLANT
NS IRRIG 1000ML POUR BTL (IV SOLUTION) ×12 IMPLANT
PACK CHEST (CUSTOM PROCEDURE TRAY) ×4 IMPLANT
PAD ARMBOARD 7.5X6 YLW CONV (MISCELLANEOUS) ×8 IMPLANT
POUCH ENDO CATCH II 15MM (MISCELLANEOUS) IMPLANT
POUCH SPECIMEN RETRIEVAL 10MM (ENDOMECHANICALS) IMPLANT
SEALANT PROGEL (MISCELLANEOUS) IMPLANT
SEALANT SURG COSEAL 4ML (VASCULAR PRODUCTS) IMPLANT
SEALANT SURG COSEAL 8ML (VASCULAR PRODUCTS) IMPLANT
SOLUTION ANTI FOG 6CC (MISCELLANEOUS) ×4 IMPLANT
SPECIMEN JAR MEDIUM (MISCELLANEOUS) ×12 IMPLANT
SPONGE INTESTINAL PEANUT (DISPOSABLE) ×8 IMPLANT
SPONGE TONSIL 1 RF SGL (DISPOSABLE) ×4 IMPLANT
SUT PROLENE 4 0 RB 1 (SUTURE)
SUT PROLENE 4-0 RB1 .5 CRCL 36 (SUTURE) IMPLANT
SUT SILK  1 MH (SUTURE) ×2
SUT SILK 1 MH (SUTURE) ×6 IMPLANT
SUT SILK 1 TIES 10X30 (SUTURE) ×4 IMPLANT
SUT SILK 2 0SH CR/8 30 (SUTURE) IMPLANT
SUT SILK 3 0SH CR/8 30 (SUTURE) IMPLANT
SUT VIC AB 0 CTX 27 (SUTURE) IMPLANT
SUT VIC AB 1 CTX 27 (SUTURE) IMPLANT
SUT VIC AB 2-0 CT1 27 (SUTURE)
SUT VIC AB 2-0 CT1 TAPERPNT 27 (SUTURE) IMPLANT
SUT VIC AB 2-0 CTX 36 (SUTURE) ×8 IMPLANT
SUT VIC AB 3-0 MH 27 (SUTURE) ×12 IMPLANT
SUT VIC AB 3-0 SH 27 (SUTURE)
SUT VIC AB 3-0 SH 27X BRD (SUTURE) IMPLANT
SUT VIC AB 3-0 X1 27 (SUTURE) ×4 IMPLANT
SUT VICRYL 0 UR6 27IN ABS (SUTURE) IMPLANT
SUT VICRYL 2 TP 1 (SUTURE) IMPLANT
SWAB COLLECTION DEVICE MRSA (MISCELLANEOUS) IMPLANT
SYR 10ML LL (SYRINGE) IMPLANT
SYR 20ML ECCENTRIC (SYRINGE) ×4 IMPLANT
SYR 30ML SLIP (SYRINGE) ×4 IMPLANT
SYSTEM SAHARA CHEST DRAIN ATS (WOUND CARE) ×4 IMPLANT
TAPE CLOTH SURG 6X10 WHT LF (GAUZE/BANDAGES/DRESSINGS) ×4 IMPLANT
TIP APPLICATOR SPRAY EXTEND 16 (VASCULAR PRODUCTS) IMPLANT
TOWEL GREEN STERILE (TOWEL DISPOSABLE) IMPLANT
TOWEL GREEN STERILE FF (TOWEL DISPOSABLE) IMPLANT
TOWEL OR 17X24 6PK STRL BLUE (TOWEL DISPOSABLE) ×4 IMPLANT
TOWEL OR 17X26 10 PK STRL BLUE (TOWEL DISPOSABLE) ×8 IMPLANT
TRAP SPECIMEN MUCOUS 40CC (MISCELLANEOUS) ×4 IMPLANT
TRAY FOLEY W/METER SILVER 16FR (SET/KITS/TRAYS/PACK) ×4 IMPLANT
TROCAR XCEL BLADELESS 5X75MML (TROCAR) ×4 IMPLANT
TUBE ANAEROBIC SPECIMEN COL (MISCELLANEOUS) IMPLANT
TUBE CONNECTING 20X1/4 (TUBING) ×4 IMPLANT
TUNNELER SHEATH ON-Q 11GX8 DSP (PAIN MANAGEMENT) IMPLANT
WATER STERILE IRR 1000ML POUR (IV SOLUTION) ×4 IMPLANT

## 2016-09-06 NOTE — Interval H&P Note (Signed)
History and Physical Interval Note:  09/06/2016 2:08 PM  [name]  has presented today for surgery, with the diagnosis of LOCULATED LEFT EFFUSION  The various methods of treatment have been discussed with the patient and family. After consideration of risks, benefits and other options for treatment, the patient has consented to  Procedure(s): VIDEO ASSISTED THORACOSCOPY (VATS)/DRAIN EMPYEMA (Left) DECORTICATION (Left) as a surgical intervention .  The patient's history has been reviewed, patient examined, no change in status, stable for surgery.  I have reviewed the patient's chart and labs.  Questions were answered to the patient's satisfaction.     [name]

## 2016-09-06 NOTE — Transfer of Care (Signed)
Immediate Anesthesia Transfer of Care Note  Patient: [name]  Procedure(s) Performed: Procedure(s): VIDEO ASSISTED THORACOSCOPY (VATS)/DRAIN EMPYEMA (Left) DECORTICATION (Left) VIDEO BRONCHOSCOPY (N/A)  Patient Location: PACU  Anesthesia Type:General  Level of Consciousness: awake, alert  and oriented  Airway & Oxygen Therapy: Patient Spontanous Breathing and Patient connected to face mask oxygen  Post-op Assessment: Report given to RN, Post -op Vital signs reviewed and stable and Patient moving all extremities  Post vital signs: Reviewed and stable  Last Vitals:  Vitals:   09/06/16 0449 09/06/16 0821  BP: 126/73 (!) 150/75  Pulse: 97 94  Resp: 19 20  Temp: 37.1 C 37.1 C    Last Pain:  Vitals:   09/06/16 0821  TempSrc: Oral  PainSc:       Patients Stated Pain Goal: 2 ([id number])  Complications: No apparent anesthesia complications

## 2016-09-06 NOTE — Progress Notes (Signed)
PROGRESS NOTE    [name]  [id number] DOB: Aug 12, 1955 DOA: 09/02/2016 PCP: Marylene Land, MD   Brief Narrative: 61 y.o. male with a past medical history significant for prostate cancer, metastatic to bone, stable, and ulcerative colitis who presents with cough, chest pain. Patient was transferred from [place] [facility] for left lower lobe pneumonia and small left pleural effusion.   Assessment & Plan:  # Left lower lobe pneumonia with large multi-loculated left pleural effusion: -IR unable to drain the fluid due to minimal complex fluid on admission. The repeat Xray and CT chest with expanding pleural effusion. Evaluated by pulmonologist and cardiothoracic surgery. Plan for VATS today. -Continue ceftriaxone and azithromycin. -Continue supportive care. -Follow-up consultants recommendation. -Monitor leukocytosis -Influenza, respiratory viral studies and HIV negative.  #Mild elevation in troponin on admission: Repeat troponin negative. ACS less likely.  #History of prostate cancer: Recommended to follow-up with oncologist outpatient. -Continue current pain management.  -Continue Colace and MiraLAX for bowel regimen  #History of ulcerative colitis: Continue Colazal  # Normocytic anemia: Likely iron deficiency. Continue oral iron. Monitor CBC.  Principal Problem:   Community acquired pneumonia of left lower lobe of lung ([place]) Active Problems:   Prostate cancer ([place])   Ulcerative colitis, unspecified, without complications ([facility])   Pleural effusion, left   Normocytic anemia   Opacity of lung on imaging study   Loculated pleural effusion   Hypoxemia  DVT prophylaxis: Discontinue Lovenox because of surgical procedure today. Continue SCD. Code Status: Full code Family Communication: Discussed with the patient's daughter and son at bedside in detail Disposition Plan: Likely discharge home in 2-3 days depending on cardiothoracic surgeons further plan and clinical  improvement Consultants:   Interventional radiologist  Pulmonologist  Cardiothoracic surgeon.  Procedures: Attempted ultrasound-guided thoracocentesis Antimicrobials: Ceftriaxone and azithromycin since February 25  Subjective: Patient was seen and examined at bedside. No new event. Plan for VATS today. Reported that left-sided chest discomfort  is stable, only worsens while coughing and moving. Denied shortness of breath, nausea, vomiting, chest pain or shortness of breath. Objective: Vitals:   09/05/16 2205 09/06/16 0315 09/06/16 0449 09/06/16 0821  BP: (!) 113/57  126/73 (!) 150/75  Pulse: 95  97 94  Resp: 20  19 20   Temp: 98.3 F (36.8 C)  98.7 F (37.1 C) 98.7 F (37.1 C)  TempSrc: Oral  Oral Oral  SpO2: 96%  94% 97%  Weight: 79.7 kg (175 lb 11.3 oz) 79.7 kg (175 lb 11.3 oz)    Height:        Intake/Output Summary (Last 24 hours) at 09/06/16 1100 Last data filed at 09/06/16 [id number]  Gross per 24 hour  Intake              170 ml  Output              675 ml  Net             -505 ml   Filed Weights   09/05/16 0500 09/05/16 2205 09/06/16 0315  Weight: 79.3 kg (174 lb 13.2 oz) 79.7 kg (175 lb 11.3 oz) 79.7 kg (175 lb 11.3 oz)    Examination:  General exam: Lying on bed comfortable, not in distress Respiratory system: Decreased breath sound on the left side, no wheezing, respiratory effort normal. Cardiovascular system: Regular rate rhythm, S1-S2 normal. No pedal edema Gastrointestinal system: Abdomen soft, nontender, nondistended. Bowel sound positive Central nervous system: Alert, awake, oriented. No focal neurological deficit  Extremities:  Symmetric 5 x 5 power. Skin: No rashes, lesions or ulcers Psychiatry: Judgement and insight appear normal. Mood & affect appropriate.     Data Reviewed: I have personally reviewed following labs and imaging studies  CBC:  Recent Labs Lab 09/01/16 0650 09/02/16 1305 09/03/16 0220 09/04/16 0718 09/05/16 0443  09/06/16 0448  WBC 11.6* 14.7* 15.3* 16.8* 17.2* 15.1*  NEUTROABS 9.6* 12.3*  --   --   --   --   HGB 12.1* 11.3* 11.6* 10.2* 10.5* 10.6*  HCT 36.2* 33.7* 34.9*  30.1* 31.3* 31.2* 31.7*  MCV 95.0 94.9 93.3 93.4 92.9 93.0  PLT 417* 419* 412* 402* 447* [id number]*   Basic Metabolic Panel:  Recent Labs Lab 09/01/16 0650 09/02/16 1305 09/03/16 0220 09/05/16 0443  NA 137 134* 136 135  K 4.1 4.3 4.0 3.6  CL 103 99* 100* 97*  CO2 26 27 26 28   GLUCOSE 128* 104* 138* 101*  BUN 25* 23* 17 12  CREATININE 1.05 0.87 0.88 0.80  CALCIUM 8.9 8.9 8.7* 8.6*   GFR: Estimated Creatinine Clearance: 97.5 mL/min (by C-G formula based on SCr of 0.8 mg/dL). Liver Function Tests:  Recent Labs Lab 09/02/16 1305 09/03/16 0220  AST 14* 24  ALT 18 27  ALKPHOS 66 91  BILITOT 0.6 0.3  PROT 7.3 6.6  ALBUMIN 3.3* 3.0*    Recent Labs Lab 09/02/16 1305  LIPASE 16   No results for input(s): AMMONIA in the last 168 hours. Coagulation Profile:  Recent Labs Lab 09/05/16 0443  INR 1.18   Cardiac Enzymes:  Recent Labs Lab 09/02/16 1305 09/02/16 2003  TROPONINI 0.03* <0.03   BNP (last 3 results) No results for input(s): PROBNP in the last 8760 hours. HbA1C: No results for input(s): HGBA1C in the last 72 hours. CBG: No results for input(s): GLUCAP in the last 168 hours. Lipid Profile: No results for input(s): CHOL, HDL, LDLCALC, TRIG, CHOLHDL, LDLDIRECT in the last 72 hours. Thyroid Function Tests: No results for input(s): TSH, T4TOTAL, FREET4, T3FREE, THYROIDAB in the last 72 hours. Anemia Panel: No results for input(s): VITAMINB12, FOLATE, FERRITIN, TIBC, IRON, RETICCTPCT in the last 72 hours. Sepsis Labs:  Recent Labs Lab 09/02/16 1409 09/04/16 1210 09/06/16 0448  PROCALCITON  --  0.35 0.46  LATICACIDVEN 1.55  --   --     Recent Results (from the past 240 hour(s))  MRSA PCR Screening     Status: None   Collection Time: 09/02/16  7:01 PM  Result Value Ref Range Status   MRSA  by PCR NEGATIVE NEGATIVE Final    Comment:        The GeneXpert MRSA Assay (FDA approved for NASAL specimens only), is one component of a comprehensive MRSA colonization surveillance program. It is not intended to diagnose MRSA infection nor to guide or monitor treatment for MRSA infections.   Culture, blood (routine x 2)     Status: None (Preliminary result)   Collection Time: 09/04/16  9:46 AM  Result Value Ref Range Status   Specimen Description BLOOD RIGHT HAND  Final   Special Requests IN PEDIATRIC BOTTLE 3ML  Final   Culture NO GROWTH 1 DAY  Final   Report Status PENDING  Incomplete  Culture, blood (routine x 2)     Status: None (Preliminary result)   Collection Time: 09/04/16  9:49 AM  Result Value Ref Range Status   Specimen Description BLOOD LEFT HAND  Final   Special Requests IN PEDIATRIC BOTTLE 3ML  Final  Culture NO GROWTH 1 DAY  Final   Report Status PENDING  Incomplete  Respiratory Panel by PCR     Status: None   Collection Time: 09/04/16 11:29 AM  Result Value Ref Range Status   Adenovirus NOT DETECTED NOT DETECTED Final   Coronavirus 229E NOT DETECTED NOT DETECTED Final   Coronavirus HKU1 NOT DETECTED NOT DETECTED Final   Coronavirus NL63 NOT DETECTED NOT DETECTED Final   Coronavirus OC43 NOT DETECTED NOT DETECTED Final   Metapneumovirus NOT DETECTED NOT DETECTED Final   Rhinovirus / Enterovirus NOT DETECTED NOT DETECTED Final   Influenza A NOT DETECTED NOT DETECTED Final   Influenza B NOT DETECTED NOT DETECTED Final   Parainfluenza Virus 1 NOT DETECTED NOT DETECTED Final   Parainfluenza Virus 2 NOT DETECTED NOT DETECTED Final   Parainfluenza Virus 3 NOT DETECTED NOT DETECTED Final   Parainfluenza Virus 4 NOT DETECTED NOT DETECTED Final   Respiratory Syncytial Virus NOT DETECTED NOT DETECTED Final   Bordetella pertussis NOT DETECTED NOT DETECTED Final   Chlamydophila pneumoniae NOT DETECTED NOT DETECTED Final   Mycoplasma pneumoniae NOT DETECTED NOT  DETECTED Final  Surgical pcr screen     Status: None   Collection Time: 09/05/16 11:32 PM  Result Value Ref Range Status   MRSA, PCR NEGATIVE NEGATIVE Final   Staphylococcus aureus NEGATIVE NEGATIVE Final    Comment:        The Xpert SA Assay (FDA approved for NASAL specimens in patients over 10 years of age), is one component of a comprehensive surveillance program.  Test performance has been validated by [facility] for patients greater than or equal to 17 year old. It is not intended to diagnose infection nor to guide or monitor treatment.          Radiology Studies: Ct Chest Wo Contrast  Result Date: 09/04/2016 CLINICAL DATA:  Chest pain, shortness of breath. EXAM: CT CHEST WITHOUT CONTRAST TECHNIQUE: Multidetector CT imaging of the chest was performed following the standard protocol without IV contrast. COMPARISON:  Radiograph of same day.  CT scan of September 01, 2016. FINDINGS: Cardiovascular: 4.1 cm ascending thoracic aortic aneurysm is noted. Atherosclerosis is noted. Mediastinum/Nodes: No enlarged mediastinal or axillary lymph nodes. Thyroid gland, trachea, and esophagus demonstrate no significant findings. Lungs/Pleura: There is interval development of large multiloculated pleural effusion in the left hemithorax. Atelectasis of left lower lobe is noted. Mild right posterior basilar subsegmental atelectasis is noted. Upper Abdomen: No acute abnormality. Musculoskeletal: No chest wall mass or suspicious bone lesions identified. IMPRESSION: 4.1 cm ascending thoracic aortic aneurysm. Recommend annual imaging followup by CTA or MRA. This recommendation follows 2010 ACCF/AHA/AATS/ACR/ASA/SCA/SCAI/SIR/STS/SVM Guidelines for the Diagnosis and Management of Patients with Thoracic Aortic Disease. Circulation. 2010; [id number]. Interval development of large multi loculated left pleural effusion. Left lower lobe atelectasis is noted. Electronically Signed   By: Marijo Conception, M.D.    On: 09/04/2016 19:17   Dg Chest Port 1 View  Result Date: 09/04/2016 CLINICAL DATA:  61 year old male with increasing shortness breath. Metastatic prostate cancer. Prior smoker. Subsequent encounter. EXAM: PORTABLE CHEST 1 VIEW COMPARISON:  09/02/2016 chest x-ray.  09/01/2016 CT. FINDINGS: Almost complete consolidation left thorax suggesting enlarging pleural effusion. Only small portion of the left upper lobe remains aerated. Shunting of blood to right lung. Right apical pleural thickening without associated bony destruction. Heart appears enlarged. Metastatic lesion left sixth rib better appreciated on CT. IMPRESSION: Almost complete consolidation left thorax suggesting enlarging pleural effusion. Only small  portion of the left upper lobe remains aerated. These results will be called to the ordering clinician or representative by the Radiologist Assistant, and communication documented in the PACS or zVision Dashboard. Electronically Signed   By: Genia Del M.D.   On: 09/04/2016 12:06        Scheduled Meds: . azithromycin  500 mg Oral Q24H  . balsalazide  750 mg Oral BID  . cefTRIAXone (ROCEPHIN)  IV  1 g Intravenous Q24H  . docusate sodium  100 mg Oral BID  . ferrous sulfate  325 mg Oral Q breakfast  . simvastatin  20 mg Oral QPC supper  . sodium chloride flush  3 mL Intravenous Q12H  . vancomycin  1,000 mg Intravenous On Call to OR   Continuous Infusions:    LOS: 4 days    Keiondre Colee Tanna Furry, MD [facility] Pager [phone]  If 7PM-7AM, please contact night-coverage [email or web address] Password [email or web address] 09/06/2016, 11:00 AM

## 2016-09-06 NOTE — Anesthesia Procedure Notes (Signed)
Procedure Name: Intubation Date/Time: 09/06/2016 3:01 PM Performed by: Trixie Deis A Pre-anesthesia Checklist: Patient identified, Emergency Drugs available, Suction available, Patient being monitored and Timeout performed Patient Re-evaluated:Patient Re-evaluated prior to inductionOxygen Delivery Method: Circle system utilized Preoxygenation: Pre-oxygenation with 100% oxygen Intubation Type: Inhalational induction with existing ETT Laryngoscope Size: Glidescope and 4 Grade View: Grade I Endobronchial tube: Left, Double lumen EBT, EBT position confirmed by fiberoptic bronchoscope and EBT position confirmed by auscultation and 39 Fr Number of attempts: 2 Placement Confirmation: ETT inserted through vocal cords under direct vision,  positive ETCO2 and breath sounds checked- equal and bilateral Secured at: 31 cm Tube secured with: Tape Dental Injury: Bloody posterior oropharynx  Comments: Switch from single lumen OETT to DLT after bronchoscopy performed by surgeon. Difficulty placing DLT during 1st attempt despite grade I view- unable to pass through cords. 2nd attempt 8.0 OETT passed easily, Cook catheter inserted, 39 L DLT passed with difficulty using glidescope for visualization. Blood/pus noted in endotracheal tube. In-line suction with NS lavage performed during confirmation of tube placement.

## 2016-09-06 NOTE — Anesthesia Procedure Notes (Addendum)
Procedure Name: Intubation Date/Time: 09/06/2016 2:46 PM Performed by: Trixie Deis A Pre-anesthesia Checklist: Patient identified, Emergency Drugs available, Suction available and Patient being monitored Patient Re-evaluated:Patient Re-evaluated prior to inductionOxygen Delivery Method: Circle System Utilized Preoxygenation: Pre-oxygenation with 100% oxygen Intubation Type: IV induction Ventilation: Mask ventilation without difficulty and Oral airway inserted - appropriate to patient size Laryngoscope Size: Mac and 4 Grade View: Grade I Tube type: Oral Tube size: 9.0 mm Number of attempts: 1 Airway Equipment and Method: Stylet and Oral airway Placement Confirmation: ETT inserted through vocal cords under direct vision,  positive ETCO2 and breath sounds checked- equal and bilateral Secured at: 22 cm Tube secured with: Tape Dental Injury: Teeth and Oropharynx as per pre-operative assessment

## 2016-09-06 NOTE — H&P (View-Only) (Signed)
Reason for Consult:Left empyema Referring Physician: [facility]  [name] is an 61 y.o. male.  HPI: 61 yo ma with a PMH of metastatic prostate cancer, ulcerative colitis, sleep apnea, arthritis, hyperlipidemia and anxiety. He had a "mild case of the flu about 6 Myint ago." He developed left sided rib pain on Saturday. Went to Ed and was found to have a small left pleural effusion. Was given PO antibiotics and discharged. Had worsening pain and increasing shortness of breath and returned to the ED on 2/25. Did have a cough but no fever or chills. His CXr had worsened. He was started on IV ceftriaxone and azithromycin and admitted. Repeat CT yesterday showed a large loculated left pleural effusion.   He currently has some left sided pain, controlled with medications. Mild shortness of breath but on room air and in no distress.  Past Medical History:  Diagnosis Date  . Allergy   . Anxiety   . Arthritis   . Hyperlipidemia   . Metastatic cancer to bone ([place])    left rib.  Marland Kitchen Neoplasm of face    benign  . Prostate cancer ([place])   . Sleep apnea   . Ulcerative colitis     Past Surgical History:  Procedure Laterality Date  . INGUINAL HERNIA REPAIR    . PROSTATECTOMY  04/2015  . SHOULDER ARTHROTOMY Right     Family History  Problem Relation Age of Onset  . Heart attack Father   . Hyperlipidemia    . Diabetes    . Thyroid disease    . Lung cancer    . Breast cancer    . Colon cancer    . Juvenile idiopathic arthritis Daughter   . Raynaud syndrome Daughter     Social History:  reports that he has quit smoking. He has never used smokeless tobacco. He reports that he drinks alcohol. He reports that he does not use drugs.  Allergies:  Allergies  Allergen Reactions  . Sulfamethoxazole     REACTION: unspecified    Medications:  Scheduled: . azithromycin  500 mg Oral Q24H  . balsalazide  750 mg Oral BID  . cefTRIAXone (ROCEPHIN)  IV  1 g Intravenous Q24H  . docusate  sodium  100 mg Oral BID  . enoxaparin (LOVENOX) injection  40 mg Subcutaneous Q24H  . ferrous sulfate  325 mg Oral Q breakfast  . simvastatin  20 mg Oral QPC supper  . sodium chloride flush  3 mL Intravenous Q12H  . [START ON 09/06/2016] vancomycin  1,000 mg Intravenous On Call to OR    Results for orders placed or performed during the hospital encounter of 09/02/16 (from the past 48 hour(s))  CBC     Status: Abnormal   Collection Time: 09/04/16  7:18 AM  Result Value Ref Range   WBC 16.8 (H) 4.0 - 10.5 K/uL   RBC 3.35 (L) 4.22 - 5.81 MIL/uL   Hemoglobin 10.2 (L) 13.0 - 17.0 g/dL   HCT 31.3 (L) 39.0 - 52.0 %   MCV 93.4 78.0 - 100.0 fL   MCH 30.4 26.0 - 34.0 pg   MCHC 32.6 30.0 - 36.0 g/dL   RDW 12.6 11.5 - 15.5 %   Platelets 402 (H) 150 - 400 K/uL  Respiratory Panel by PCR     Status: None   Collection Time: 09/04/16 11:29 AM  Result Value Ref Range   Adenovirus NOT DETECTED NOT DETECTED   Coronavirus 229E NOT DETECTED NOT DETECTED  Coronavirus HKU1 NOT DETECTED NOT DETECTED   Coronavirus NL63 NOT DETECTED NOT DETECTED   Coronavirus OC43 NOT DETECTED NOT DETECTED   Metapneumovirus NOT DETECTED NOT DETECTED   Rhinovirus / Enterovirus NOT DETECTED NOT DETECTED   Influenza A NOT DETECTED NOT DETECTED   Influenza B NOT DETECTED NOT DETECTED   Parainfluenza Virus 1 NOT DETECTED NOT DETECTED   Parainfluenza Virus 2 NOT DETECTED NOT DETECTED   Parainfluenza Virus 3 NOT DETECTED NOT DETECTED   Parainfluenza Virus 4 NOT DETECTED NOT DETECTED   Respiratory Syncytial Virus NOT DETECTED NOT DETECTED   Bordetella pertussis NOT DETECTED NOT DETECTED   Chlamydophila pneumoniae NOT DETECTED NOT DETECTED   Mycoplasma pneumoniae NOT DETECTED NOT DETECTED  Influenza panel by PCR (type A & B)     Status: None   Collection Time: 09/04/16 11:29 AM  Result Value Ref Range   Influenza A By PCR NEGATIVE NEGATIVE   Influenza B By PCR NEGATIVE NEGATIVE    Comment: (NOTE) The Xpert Xpress Flu assay  is intended as an aid in the diagnosis of  influenza and should not be used as a sole basis for treatment.  This  assay is FDA approved for nasopharyngeal swab specimens only. Nasal  washings and aspirates are unacceptable for Xpert Xpress Flu testing.   Procalcitonin - Baseline     Status: None   Collection Time: 09/04/16 12:10 PM  Result Value Ref Range   Procalcitonin 0.35 ng/mL    Comment:        Interpretation: PCT (Procalcitonin) <= 0.5 ng/mL: Systemic infection (sepsis) is not likely. Local bacterial infection is possible. (NOTE)         ICU PCT Algorithm               Non ICU PCT Algorithm    ----------------------------     ------------------------------         PCT < 0.25 ng/mL                 PCT < 0.1 ng/mL     Stopping of antibiotics            Stopping of antibiotics       strongly encouraged.               strongly encouraged.    ----------------------------     ------------------------------       PCT level decrease by               PCT < 0.25 ng/mL       >= 80% from peak PCT       OR PCT 0.25 - 0.5 ng/mL          Stopping of antibiotics                                             encouraged.     Stopping of antibiotics           encouraged.    ----------------------------     ------------------------------       PCT level decrease by              PCT >= 0.25 ng/mL       < 80% from peak PCT        AND PCT >= 0.5 ng/mL  Continuin g antibiotics                                              encouraged.       Continuing antibiotics            encouraged.    ----------------------------     ------------------------------     PCT level increase compared          PCT > 0.5 ng/mL         with peak PCT AND          PCT >= 0.5 ng/mL             Escalation of antibiotics                                          strongly encouraged.      Escalation of antibiotics        strongly encouraged.   CBC     Status: Abnormal   Collection Time: 09/05/16  4:43 AM   Result Value Ref Range   WBC 17.2 (H) 4.0 - 10.5 K/uL   RBC 3.36 (L) 4.22 - 5.81 MIL/uL   Hemoglobin 10.5 (L) 13.0 - 17.0 g/dL   HCT 31.2 (L) 39.0 - 52.0 %   MCV 92.9 78.0 - 100.0 fL   MCH 31.3 26.0 - 34.0 pg   MCHC 33.7 30.0 - 36.0 g/dL   RDW 12.6 11.5 - 15.5 %   Platelets 447 (H) 150 - 400 K/uL  Basic metabolic panel     Status: Abnormal   Collection Time: 09/05/16  4:43 AM  Result Value Ref Range   Sodium 135 135 - 145 mmol/L   Potassium 3.6 3.5 - 5.1 mmol/L   Chloride 97 (L) 101 - 111 mmol/L   CO2 28 22 - 32 mmol/L   Glucose, Bld 101 (H) 65 - 99 mg/dL   BUN 12 6 - 20 mg/dL   Creatinine, Ser 0.80 0.61 - 1.24 mg/dL   Calcium 8.6 (L) 8.9 - 10.3 mg/dL   GFR calc non Af Amer >60 >60 mL/min   GFR calc Af Amer >60 >60 mL/min    Comment: (NOTE) The eGFR has been calculated using the CKD EPI equation. This calculation has not been validated in all clinical situations. eGFR's persistently <60 mL/min signify possible Chronic Kidney Disease.    Anion gap 10 5 - 15  Protime-INR     Status: None   Collection Time: 09/05/16  4:43 AM  Result Value Ref Range   Prothrombin Time 15.1 11.4 - 15.2 seconds   INR 1.18   APTT     Status: None   Collection Time: 09/05/16  4:43 AM  Result Value Ref Range   aPTT 35 24 - 36 seconds    Ct Chest Wo Contrast  Result Date: 09/04/2016 CLINICAL DATA:  Chest pain, shortness of breath. EXAM: CT CHEST WITHOUT CONTRAST TECHNIQUE: Multidetector CT imaging of the chest was performed following the standard protocol without IV contrast. COMPARISON:  Radiograph of same day.  CT scan of September 01, 2016. FINDINGS: Cardiovascular: 4.1 cm ascending thoracic aortic aneurysm is noted. Atherosclerosis is noted. Mediastinum/Nodes: No enlarged mediastinal or axillary lymph nodes. Thyroid gland, trachea, and esophagus demonstrate no significant findings.  Lungs/Pleura: There is interval development of large multiloculated pleural effusion in the left hemithorax.  Atelectasis of left lower lobe is noted. Mild right posterior basilar subsegmental atelectasis is noted. Upper Abdomen: No acute abnormality. Musculoskeletal: No chest wall mass or suspicious bone lesions identified. IMPRESSION: 4.1 cm ascending thoracic aortic aneurysm. Recommend annual imaging followup by CTA or MRA. This recommendation follows 2010 ACCF/AHA/AATS/ACR/ASA/SCA/SCAI/SIR/STS/SVM Guidelines for the Diagnosis and Management of Patients with Thoracic Aortic Disease. Circulation. 2010; 121: J628-B151. Interval development of large multi loculated left pleural effusion. Left lower lobe atelectasis is noted. Electronically Signed   By: Marijo Conception, M.D.   On: 09/04/2016 19:17   Dg Chest Port 1 View  Result Date: 09/04/2016 CLINICAL DATA:  61 year old male with increasing shortness breath. Metastatic prostate cancer. Prior smoker. Subsequent encounter. EXAM: PORTABLE CHEST 1 VIEW COMPARISON:  09/02/2016 chest x-ray.  09/01/2016 CT. FINDINGS: Almost complete consolidation left thorax suggesting enlarging pleural effusion. Only small portion of the left upper lobe remains aerated. Shunting of blood to right lung. Right apical pleural thickening without associated bony destruction. Heart appears enlarged. Metastatic lesion left sixth rib better appreciated on CT. IMPRESSION: Almost complete consolidation left thorax suggesting enlarging pleural effusion. Only small portion of the left upper lobe remains aerated. These results will be called to the ordering clinician or representative by the Radiologist Assistant, and communication documented in the PACS or zVision Dashboard. Electronically Signed   By: Genia Del M.D.   On: 09/04/2016 12:06    Review of Systems  Constitutional: Positive for malaise/fatigue. Negative for chills and fever.  Respiratory: Positive for cough and shortness of breath.   Cardiovascular: Positive for chest pain (left sided) and orthopnea.  Gastrointestinal: Negative  for blood in stool, nausea and vomiting.  Genitourinary: Negative for dysuria and urgency.       History of prostatectomy  Musculoskeletal: Positive for back pain and joint pain.  Neurological: Positive for weakness. Negative for focal weakness and loss of consciousness.  Endo/Heme/Allergies: Positive for environmental allergies. Does not bruise/bleed easily.  All other systems reviewed and are negative.  Blood pressure 128/66, pulse 91, temperature 99.6 F (37.6 C), temperature source Oral, resp. rate 18, height '5\' 6"'$  (1.676 m), weight 174 lb 13.2 oz (79.3 kg), SpO2 95 %. Physical Exam  Vitals reviewed. Constitutional: He is oriented to person, place, and time. He appears well-developed and well-nourished. No distress.  HENT:  Head: Normocephalic and atraumatic.  Eyes: Conjunctivae and EOM are normal. No scleral icterus.  Neck: No thyromegaly present.  Cardiovascular: Normal rate, regular rhythm and normal heart sounds.   No murmur heard. Respiratory: Effort normal. No respiratory distress. He has no wheezes.  Absent BS on left  GI: Soft. He exhibits no distension. There is no tenderness.  Musculoskeletal: He exhibits no edema.  Lymphadenopathy:    He has no cervical adenopathy.  Neurological: He is alert and oriented to person, place, and time. No cranial nerve deficit.  Skin: Skin is warm and dry.    Assessment/Plan: 61 yo man who presents with pleuritic chest pain and shortness of breath. He has a left lower lobe pneumonia with an early organizing empyema that has developed rapidly while in the hospital on IV antibiotics. Bronchoscopy and left VATS to drain the empyema and decorticate the lung are indicated for relief of symptoms and to prevent loss of pulmonary function.  I have discussed the general nature of the procedure, the need for general anesthesia, the incisions to be used and the  need for drainage tubes postoperatively with Mr. Coate and his children. We discussed the  expected hospital stay, overall recovery and short and long term outcomes. I informed them of the indications, risks, benefits and alternatives. They understand the risks include, but are not limited to death, stroke, MI, DVT/PE, bleeding, possible need for transfusion, infections, prolonged air leak, cardiac arrhythmias, as well as other organ system dysfunction including respiratory, renal, or GI complications.   He accepts the risks and agrees to proceed.  [name] 09/05/2016, 2:59 PM

## 2016-09-06 NOTE — Anesthesia Procedure Notes (Signed)
Central Venous Catheter Insertion Performed by: Effie Berkshire, anesthesiologist Start/End3/07/2016 2:15 PM, 09/06/2016 2:25 PM Patient location: Pre-op. Preanesthetic checklist: patient identified, IV checked, site marked, risks and benefits discussed, surgical consent, monitors and equipment checked, pre-op evaluation, timeout performed and anesthesia consent Position: Trendelenburg Lidocaine 1% used for infiltration and patient sedated Hand hygiene performed , maximum sterile barriers used  and Seldinger technique used Catheter size: 8 Fr Total catheter length 16. Central line was placed.Double lumen Procedure performed using ultrasound guided technique. Ultrasound Notes:anatomy identified, needle tip was noted to be adjacent to the nerve/plexus identified, no ultrasound evidence of intravascular and/or intraneural injection and image(s) printed for medical record Attempts: 1 Following insertion, dressing applied, line sutured and Biopatch. Post procedure assessment: blood return through all ports  Patient tolerated the procedure well with no immediate complications.

## 2016-09-06 NOTE — Anesthesia Preprocedure Evaluation (Addendum)
Anesthesia Evaluation  Patient identified by MRN, date of birth, ID band Patient awake    Reviewed: Allergy & Precautions, NPO status , Patient's Chart, lab work & pertinent test results  Airway Mallampati: II  TM Distance: >3 FB Neck ROM: Full    Dental  (+) Teeth Intact, Dental Advisory Given   Pulmonary sleep apnea and Continuous Positive Airway Pressure Ventilation , former smoker,    breath sounds clear to auscultation       Cardiovascular negative cardio ROS   Rhythm:Regular Rate:Normal     Neuro/Psych Anxiety negative neurological ROS     GI/Hepatic Neg liver ROS, PUD,   Endo/Other  negative endocrine ROS  Renal/GU negative Renal ROS  negative genitourinary   Musculoskeletal  (+) Arthritis , Osteoarthritis,    Abdominal   Peds negative pediatric ROS (+)  Hematology   Anesthesia Other Findings - HLD  Reproductive/Obstetrics negative OB ROS                           Lab Results  Component Value Date   WBC 15.1 (H) 09/06/2016   HGB 10.6 (L) 09/06/2016   HCT 31.7 (L) 09/06/2016   MCV 93.0 09/06/2016   PLT 441 (H) 09/06/2016   Lab Results  Component Value Date   CREATININE 0.80 09/05/2016   BUN 12 09/05/2016   NA 135 09/05/2016   K 3.6 09/05/2016   CL 97 (L) 09/05/2016   CO2 28 09/05/2016   Lab Results  Component Value Date   INR 1.18 09/05/2016   EKG: normal sinus rhythm.  Anesthesia Physical Anesthesia Plan  ASA: II  Anesthesia Plan: General   Post-op Pain Management:    Induction: Intravenous  Airway Management Planned: Double Lumen EBT  Additional Equipment: Arterial line, CVP and Ultrasound Guidance Line Placement  Intra-op Plan:   Post-operative Plan: Extubation in OR  Informed Consent: I have reviewed the patients History and Physical, chart, labs and discussed the procedure including the risks, benefits and alternatives for the proposed anesthesia  with the patient or authorized representative who has indicated his/her understanding and acceptance.   Dental advisory given  Plan Discussed with: CRNA, Anesthesiologist and Surgeon  Anesthesia Plan Comments:        Anesthesia Quick Evaluation

## 2016-09-06 NOTE — Progress Notes (Signed)
Pharmacy Antibiotic Note  [name] is a 61 y.o. male admitted on 09/02/2016 with empyema.  Pharmacy has been consulted for vancomycin dosing. Loculated L effusion, left empyema s/p VATS and drainage of empyema on 3/1.  vanc 1 gm given on call to OR at 1420.  Pt on CTX/azith since 2/25. WBC 15.1, creat 0.8, afebrile. Wt 79.7 kg  Plan: Vancomycin 1000 mg IV every 8 hours.  Goal trough 15-20 mcg/mL.  F/u renal fxn, wbc, temp, culture data vanc trough as needed  Height: 5\' 6"  (167.6 cm) Weight: 175 lb 11.3 oz (79.7 kg) IBW/kg (Calculated) : 63.8  Temp (24hrs), Avg:98.3 F (36.8 C), Min:97.7 F (36.5 C), Max:98.7 F (37.1 C)   Recent Labs Lab 09/01/16 0650 09/02/16 1305 09/02/16 1409 09/03/16 0220 09/04/16 0718 09/05/16 0443 09/06/16 0448  WBC 11.6* 14.7*  --  15.3* 16.8* 17.2* 15.1*  CREATININE 1.05 0.87  --  0.88  --  0.80  --   LATICACIDVEN  --   --  1.55  --   --   --   --     Estimated Creatinine Clearance: 97.5 mL/min (by C-G formula based on SCr of 0.8 mg/dL).    Allergies  Allergen Reactions  . Sulfamethoxazole     REACTION: unspecified    Antimicrobials this admission: 2/24 levaquin x 1 dose 2/25 azith >>2/28 2/25 CXT >>  (scheduled to end 3/3) 2/25 vanc x 1 dose 3/1 vanc>> Dose adjustments this admission:   Microbiology results: 3/1 lung tissue> 2/28 MRSA PCR neg 2/27 Resp panel neg 2/27 BCx2>>ngtd Flu neg HIV neg  Thank you for allowing pharmacy to be a part of this patient's care.  Eudelia Bunch, Pharm.D. [id number] 09/06/2016 8:33 PM

## 2016-09-06 NOTE — Progress Notes (Signed)
PT Cancellation Note  Patient Details Name: [name] MRN: [id number] DOB: [birth year]   Cancelled Treatment:    Reason Eval/Treat Not Completed: Medical issues which prohibited therapy;Patient at procedure or test/unavailable.  Pt has already had his pre-op prep and going to surgery a little later.  No available for therapy. 09/06/2016  Donnella Sham, [place] [phone]  (pager)   Tessie Fass Kaleia Longhi 09/06/2016, 10:58 AM

## 2016-09-06 NOTE — Brief Op Note (Addendum)
09/02/2016 - 09/06/2016  5:32 PM  PATIENT:  [name]  61 y.o. male  PRE-OPERATIVE DIAGNOSIS:  1. LOCULATED LEFT EFFUSION 2. LEFT EMPYEMA  POST-OPERATIVE DIAGNOSIS:  1. LOCULATED LEFT EFFUSION 2. LEFT EMPYEMA   PROCEDURE:  VIDEO BRONCHOSCOPY,  LEFT VIDEO ASSISTED THORACOSCOPY (VATS),  DRAIN LEFT EMPYEMA,, and  DECORTICATION    SURGEON:  Surgeon(s) and Role:    * [name], MD - Primary  PHYSICIAN ASSISTANT: Lars Pinks PA-C  ANESTHESIA:   general  EBL:  Total I/O In: 1300 [I.V.:1300] Out: [id number] [Urine:585; Blood:200]  BLOOD ADMINISTERED:none  DRAINS: 2 17 Blake drains and a 42 French chest tube all placed in the left pleural space   SPECIMEN:  Source of Specimen:  Left visceral and parietal pleura, left pleural fluid  DISPOSITION OF SPECIMEN:  Pathology, culture, AFB, fungal, gram stain  COUNTS CORRECT:  YES  DICTATION: .Dragon Dictation  PLAN OF CARE: Admit to inpatient   PATIENT DISPOSITION:  PACU - hemodynamically stable.   Delay start of Pharmacological VTE agent (>24hrs) due to surgical blood loss or risk of bleeding: yes  Findings: Early organizing empyema

## 2016-09-06 NOTE — Anesthesia Postprocedure Evaluation (Signed)
Anesthesia Post Note  Patient: [name]  Procedure(s) Performed: Procedure(s) (LRB): VIDEO ASSISTED THORACOSCOPY (VATS)/DRAIN EMPYEMA (Left) DECORTICATION (Left) VIDEO BRONCHOSCOPY (N/A)  Patient location during evaluation: PACU Anesthesia Type: General Level of consciousness: awake and alert Pain management: pain level controlled Vital Signs Assessment: post-procedure vital signs reviewed and stable Respiratory status: spontaneous breathing, nonlabored ventilation, respiratory function stable and patient connected to nasal cannula oxygen Cardiovascular status: blood pressure returned to baseline and stable Postop Assessment: no signs of nausea or vomiting Anesthetic complications: no       Last Vitals:  Vitals:   09/06/16 2004 09/06/16 2005  BP:    Pulse: 81   Resp:    Temp:  36.6 C    Last Pain:  Vitals:   09/06/16 2006  TempSrc:   PainSc: 5                  Raunak Antuna DAVID

## 2016-09-06 NOTE — Progress Notes (Signed)
Dr. Cyndia Bent made aware of patient complaint of pain 10/10, despite PCA and PRN medications. Orders received to increase to full dose PCA and for PRN Toradol. Will continue to closely monitor. Richarda Blade RN.

## 2016-09-07 ENCOUNTER — Inpatient Hospital Stay (HOSPITAL_COMMUNITY): Payer: BLUE CROSS/BLUE SHIELD

## 2016-09-07 ENCOUNTER — Encounter (HOSPITAL_COMMUNITY): Payer: Self-pay | Admitting: Thoracic Surgery (Cardiothoracic Vascular Surgery)

## 2016-09-07 LAB — BLOOD GAS, ARTERIAL
Acid-Base Excess: 4.5 mmol/L — ABNORMAL HIGH (ref 0.0–2.0)
BICARBONATE: 28.6 mmol/L — AB (ref 20.0–28.0)
DRAWN BY: 252031
O2 CONTENT: 4 L/min
O2 Saturation: 93.9 %
PATIENT TEMPERATURE: 98.6
PH ART: 7.429 (ref 7.350–7.450)
pCO2 arterial: 44 mmHg (ref 32.0–48.0)
pO2, Arterial: 69.7 mmHg — ABNORMAL LOW (ref 83.0–108.0)

## 2016-09-07 LAB — GLUCOSE, CAPILLARY
GLUCOSE-CAPILLARY: 143 mg/dL — AB (ref 65–99)
GLUCOSE-CAPILLARY: 126 mg/dL — AB (ref 65–99)
GLUCOSE-CAPILLARY: 139 mg/dL — AB (ref 65–99)
Glucose-Capillary: 187 mg/dL — ABNORMAL HIGH (ref 65–99)
Glucose-Capillary: 115 mg/dL — ABNORMAL HIGH (ref 65–99)

## 2016-09-07 LAB — BASIC METABOLIC PANEL
ANION GAP: 9 (ref 5–15)
BUN: 17 mg/dL (ref 6–20)
CALCIUM: 8 mg/dL — AB (ref 8.9–10.3)
CO2: 27 mmol/L (ref 22–32)
Chloride: 99 mmol/L — ABNORMAL LOW (ref 101–111)
Creatinine, Ser: 0.73 mg/dL (ref 0.61–1.24)
GFR calc Af Amer: >60 mL/min (ref >60)
Glucose, Bld: 200 mg/dL — ABNORMAL HIGH (ref 65–99)
Potassium: 4 mmol/L (ref 3.5–5.1)
SODIUM: 135 mmol/L (ref 135–145)

## 2016-09-07 LAB — CBC
HCT: 30.4 % — ABNORMAL LOW (ref 39.0–52.0)
HEMOGLOBIN: 10 g/dL — AB (ref 13.0–17.0)
MCH: 30.7 pg (ref 26.0–34.0)
MCHC: 32.9 g/dL (ref 30.0–36.0)
MCV: 93.3 fL (ref 78.0–100.0)
PLATELETS: 450 10*3/uL — AB (ref 150–400)
RBC: 3.26 MIL/uL — AB (ref 4.22–5.81)
RDW: 13 % (ref 11.5–15.5)
WBC: 17.9 10*3/uL — AB (ref 4.0–10.5)

## 2016-09-07 MED ORDER — SODIUM CHLORIDE 0.9% FLUSH: 10.0000 mL | INTRAVENOUS | Status: DC | PRN

## 2016-09-07 MED ORDER — CHLORHEXIDINE GLUCONATE CLOTH 2 % EX PADS
6.0000 | MEDICATED_PAD | Freq: Every day | CUTANEOUS | Status: DC
  Administered 2016-09-08 – 2016-09-09 (×2): 6 via TOPICAL

## 2016-09-07 MED ORDER — BALSALAZIDE DISODIUM 750 MG PO CAPS: 2250.0000 mg | ORAL_CAPSULE | Freq: Every day | ORAL | Status: DC

## 2016-09-07 MED ORDER — SODIUM CHLORIDE 0.9% FLUSH
10.0000 mL | Freq: Two times a day (BID) | INTRAVENOUS | Status: DC
  Administered 2016-09-07 – 2016-09-10 (×4): 10 mL

## 2016-09-07 MED ORDER — INSULIN ASPART 100 UNIT/ML ~~LOC~~ SOLN: 0.0000 [IU] | Freq: Every day | SUBCUTANEOUS | Status: DC

## 2016-09-07 MED ORDER — VANCOMYCIN HCL IN DEXTROSE 750-5 MG/150ML-% IV SOLN
750.0000 mg | Freq: Three times a day (TID) | INTRAVENOUS | Status: DC
  Administered 2016-09-07 – 2016-09-11 (×12): 750 mg via INTRAVENOUS
  Filled 2016-09-07 (×13): qty 150

## 2016-09-07 MED ORDER — INSULIN ASPART 100 UNIT/ML ~~LOC~~ SOLN
0.0000 [IU] | Freq: Three times a day (TID) | SUBCUTANEOUS | Status: DC
  Administered 2016-09-07 (×2): 2 [IU] via SUBCUTANEOUS
  Administered 2016-09-07: 3 [IU] via SUBCUTANEOUS

## 2016-09-07 MED ORDER — ENOXAPARIN SODIUM 40 MG/0.4ML ~~LOC~~ SOLN
40.0000 mg | SUBCUTANEOUS | Status: DC
  Administered 2016-09-07 – 2016-09-11 (×5): 40 mg via SUBCUTANEOUS
  Filled 2016-09-07 (×5): qty 0.4

## 2016-09-07 NOTE — Progress Notes (Signed)
1 Day Post-Op Procedure(s) (LRB): VIDEO ASSISTED THORACOSCOPY (VATS)/DRAIN EMPYEMA (Left) DECORTICATION (Left) VIDEO BRONCHOSCOPY (N/A) Subjective: Some incisional pain "I feel better than I thought I would"  Objective: Vital signs in last 24 hours: Temp:  [97.7 F (36.5 C)-98.7 F (37.1 C)] 97.7 F (36.5 C) (03/02 0400) Pulse Rate:  [66-94] 69 (03/02 0700) Cardiac Rhythm: Normal sinus rhythm (03/02 0600) Resp:  [11-28] 13 (03/02 0700) BP: (104-150)/(66-96) 107/75 (03/02 0700) SpO2:  [90 %-100 %] 96 % (03/02 0700) Arterial Line BP: (98-188)/(43-83) 134/51 (03/02 0700) Weight:  [174 lb 12.8 oz (79.3 kg)] 174 lb 12.8 oz (79.3 kg) (03/02 0500)  Hemodynamic parameters for last 24 hours:    Intake/Output from previous day: 03/01 0701 - 03/02 0700 In: 3028.3 [P.O.:50; I.V.:2528.3; IV Piggyback:450] Out: 2030 [Urine:1440; Blood:200; Chest Tube:390] Intake/Output this shift: No intake/output data recorded.  General appearance: alert, cooperative and no distress Neurologic: intact Heart: regular rate and rhythm Lungs: diminished breath sounds bibasilar Abdomen: normal findings: soft, non-tender no air leak  Lab Results:  Recent Labs  09/06/16 0448 09/07/16 0508  WBC 15.1* 17.9*  HGB 10.6* 10.0*  HCT 31.7* 30.4*  PLT 441* 450*   BMET:  Recent Labs  09/05/16 0443 09/07/16 0508  NA 135 135  K 3.6 4.0  CL 97* 99*  CO2 28 27  GLUCOSE 101* 200*  BUN 12 17  CREATININE 0.80 0.73  CALCIUM 8.6* 8.0*    PT/INR:  Recent Labs  09/05/16 0443  LABPROT 15.1  INR 1.18   ABG    Component Value Date/Time   PHART 7.429 09/07/2016 0344   HCO3 28.6 (H) 09/07/2016 0344   O2SAT 93.9 09/07/2016 0344   CBG (last 3)   Recent Labs  09/06/16 1822 09/06/16 2332 09/07/16 0415  GLUCAP 131* 186* 187*    Assessment/Plan: S/P Procedure(s) (LRB): VIDEO ASSISTED THORACOSCOPY (VATS)/DRAIN EMPYEMA (Left) DECORTICATION (Left) VIDEO BRONCHOSCOPY (N/A) -CV- stable  RESP_  pneumonia complicated by empyema, s/p decortication  CT to water seal  IS, flutter  ID- CAP- continue vanco and ceftriaxone pending culture results  RENAL- creatinine OK  ENDO- CBG mildly elevated, SSi as needed  Anemia secondary to ABL- mild, follow  SCD + enoxaparin for DVT prophylaxis  Ambulate   LOS: 5 days    [name] 09/07/2016

## 2016-09-07 NOTE — Progress Notes (Signed)
CT surgery p.m. Rounds  Stable day up in chair Chest tubes to water seal Drainage is decreasing and hopefully remove 1 tube tomorrow Pain control with PCA satisfactory

## 2016-09-07 NOTE — Op Note (Signed)
NAMEZACHAREE, ILLES                 ACCOUNT NO.:  [id number]  MEDICAL RECORD NO.:  [id number]  LOCATION:  MH10                          FACILITY:  [facility]  PHYSICIAN:  Revonda Standard. Roxan Hockey, M.D.DATE OF BIRTH:  [birth year]  DATE OF PROCEDURE:  09/06/2016 DATE OF DISCHARGE:                              OPERATIVE REPORT   PREOPERATIVE DIAGNOSIS:  Left empyema.  POSTOPERATIVE DIAGNOSIS:  Left empyema.  PROCEDURE:  Bronchoscopy, left video-assisted thoracoscopy, drainage of empyema, visceral and parietal pleural decortication.  SURGEON:  Revonda Standard. Roxan Hockey, M.D.  ASSISTANT:  Lars Pinks, P.A.  ANESTHESIA:  General.  FINDINGS:    Bronchoscopy- normal endobronchial anatomy, some edema, but no endobronchial lesions seen.   VATS- early organizing empyema with extensive visceral and parietal pleural peel, good re-expansion of both lobes after decortication.  CLINICAL NOTE:  Mr. Mastandrea is a 61 year old gentleman who was admitted with left-sided chest pain and shortness of breath.  He was treated for pneumonia with oral antibiotics initially, but returned with progressive symptoms.  He was started on intravenous antibiotics, but during his admission, he developed a large loculated left pleural effusion.  He was advised to undergo left VATS to drain the empyema as well as bronchoscopy.  The indications, risks, benefits, and alternatives were discussed in detail with the patient.  He understood and accepted the risks and agreed to proceed.  DESCRIPTION OF PROCEDURE:  Mr. Steenbergen was brought to the preoperative holding area on September 06, 2016.  The Anesthesia Service placed a central line and arterial blood pressure monitoring line.  He was taken to the operating room, anesthetized, and intubated.  Flexible fiberoptic bronchoscopy was performed via the endotracheal tube.  It revealed normal endobronchial anatomy and no endobronchial lesions.  A Foley catheter was placed.  Sequential  compression devices were placed on the calves for DVT prophylaxis.  The single-lumen endotracheal tube was switched out for a double-lumen tube.  He was given intravenous vancomycin in addition to the antibiotics he was already receiving.  He was placed in a right lateral decubitus position and the left chest was prepped and draped in usual sterile fashion.  Single lung ventilation of the right lung was initiated and was tolerated well throughout the procedure.  An incision was made in the seventh interspace in the midaxillary line. It was carried through the skin and subcutaneous tissue.  The chest was entered bluntly using a hemostat.  A sucker was placed into the chest and a small amount of fluid was evacuated.  Digital palpation revealed some adhesions in the area.  A small working incision was made in the fifth interspace anterolaterally.  This was approximately 4 cm in length.  No rib spreading was performed during the procedure.  The adhesions between the 2 incisions were taken down.  A port was inserted and the thoracoscope was advanced into the chest.  There was a complex loculated pleural effusion.  The fluid was evacuated.  It was sent for both culture and cytology.  The lingula and the lower lobe were encased in a fibrinous peel.  Adhesions were taken down circumferentially as well as throughout the fissure.  There was extensive fibrinous exudate which  was removed.  The lung was freed up posteriorly.  There was extensive inflammatory reaction posterolaterally near the diaphragm. The visceral pleura then was decorticated.  The peel came off relatively easily without significant pleural tears after completely decorticating the lingular segment and lower lobe and the fissure.  The parietal pleural decortication was performed.  A separate small port type incision was made to allow retraction of the diaphragm to ensure that the costophrenic angle was cleared.  All of the fibrinous  exudate and debris were removed.  The chest was copiously irrigated with warm saline.  Two 32-French Blake drains and a 28-French chest tube were placed.  The Blake drains were placed through the preexisting incisions and another incision was made for the placement of the chest tube. These were all secured to the skin with #1 silk sutures.  The left lung was reinflated and there was good expansion of both lobes.  The working incision was closed in a standard fashion.  The chest tubes were placed to suction.  The patient was extubated in the operating room and taken to the postanesthetic care unit in good condition.     Revonda Standard Roxan Hockey, M.D.     SCH/MEDQ  D:  09/06/2016  T:  09/07/2016  Job:  [id number]

## 2016-09-07 NOTE — Progress Notes (Signed)
Pt is s/p VATS and now in ICU.  Discussed with CTS team Girtha Rm), pt will be in CTS service.  We will not follow patient. Please call us back if any question.  Thank you.

## 2016-09-08 ENCOUNTER — Inpatient Hospital Stay (HOSPITAL_COMMUNITY): Payer: BLUE CROSS/BLUE SHIELD

## 2016-09-08 LAB — GLUCOSE, CAPILLARY
GLUCOSE-CAPILLARY: 120 mg/dL — AB (ref 65–99)
Glucose-Capillary: 94 mg/dL (ref 65–99)
Glucose-Capillary: 85 mg/dL (ref 65–99)
Glucose-Capillary: 114 mg/dL — ABNORMAL HIGH (ref 65–99)

## 2016-09-08 LAB — ACID FAST SMEAR (AFB, MYCOBACTERIA)

## 2016-09-08 LAB — CBC
HEMATOCRIT: 27.5 % — AB (ref 39.0–52.0)
HEMOGLOBIN: 8.8 g/dL — AB (ref 13.0–17.0)
MCH: 29.9 pg (ref 26.0–34.0)
MCHC: 32 g/dL (ref 30.0–36.0)
MCV: 93.5 fL (ref 78.0–100.0)
Platelets: 455 10*3/uL — ABNORMAL HIGH (ref 150–400)
RBC: 2.94 MIL/uL — ABNORMAL LOW (ref 4.22–5.81)
RDW: 13 % (ref 11.5–15.5)
WBC: 12 10*3/uL — ABNORMAL HIGH (ref 4.0–10.5)

## 2016-09-08 LAB — COMPREHENSIVE METABOLIC PANEL
ALBUMIN: 1.9 g/dL — AB (ref 3.5–5.0)
ALK PHOS: 189 U/L — AB (ref 38–126)
ALT: 73 U/L — ABNORMAL HIGH (ref 17–63)
ANION GAP: 7 (ref 5–15)
AST: 45 U/L — AB (ref 15–41)
BILIRUBIN TOTAL: 0.3 mg/dL (ref 0.3–1.2)
BUN: 16 mg/dL (ref 6–20)
CALCIUM: 8 mg/dL — AB (ref 8.9–10.3)
CO2: 29 mmol/L (ref 22–32)
Chloride: 101 mmol/L (ref 101–111)
Creatinine, Ser: 0.86 mg/dL (ref 0.61–1.24)
GFR calc Af Amer: >60 mL/min (ref >60)
GFR calc non Af Amer: >60 mL/min (ref >60)
GLUCOSE: 139 mg/dL — AB (ref 65–99)
Potassium: 3.7 mmol/L (ref 3.5–5.1)
Sodium: 137 mmol/L (ref 135–145)
TOTAL PROTEIN: 5.1 g/dL — AB (ref 6.5–8.1)

## 2016-09-08 LAB — ACID FAST SMEAR (AFB)
ACID FAST SMEAR - AFSCU2: NEGATIVE
ACID FAST SMEAR - AFSCU2: NEGATIVE
ACID FAST SMEAR - AFSCU2: NEGATIVE

## 2016-09-08 MED ORDER — SODIUM CHLORIDE 0.9 % IV SOLN
30.0000 meq | Freq: Once | INTRAVENOUS | Status: AC
  Administered 2016-09-08: 30 meq via INTRAVENOUS
  Filled 2016-09-08: qty 15

## 2016-09-08 NOTE — Progress Notes (Signed)
CT Surgery PM Rounds OR cultures gram + cocci in pairs Doing well Will DC another chest tube in am and transfer to floor [facility] [employer]

## 2016-09-09 ENCOUNTER — Inpatient Hospital Stay (HOSPITAL_COMMUNITY): Payer: BLUE CROSS/BLUE SHIELD

## 2016-09-09 LAB — CULTURE, RESPIRATORY

## 2016-09-09 LAB — AEROBIC CULTURE  (SUPERFICIAL SPECIMEN): CULTURE: NO GROWTH

## 2016-09-09 LAB — CULTURE, BLOOD (ROUTINE X 2)
CULTURE: NO GROWTH
CULTURE: NO GROWTH

## 2016-09-09 LAB — BASIC METABOLIC PANEL
Anion gap: 6 (ref 5–15)
BUN: 15 mg/dL (ref 6–20)
CO2: 29 mmol/L (ref 22–32)
Calcium: 8 mg/dL — ABNORMAL LOW (ref 8.9–10.3)
Chloride: 104 mmol/L (ref 101–111)
Creatinine, Ser: 0.83 mg/dL (ref 0.61–1.24)
GFR calc Af Amer: >60 mL/min (ref >60)
GFR calc non Af Amer: >60 mL/min (ref >60)
Glucose, Bld: 133 mg/dL — ABNORMAL HIGH (ref 65–99)
Potassium: 4.2 mmol/L (ref 3.5–5.1)
Sodium: 139 mmol/L (ref 135–145)

## 2016-09-09 LAB — AEROBIC CULTURE W GRAM STAIN (SUPERFICIAL SPECIMEN): Culture: NO GROWTH

## 2016-09-09 LAB — CBC
HCT: 27.8 % — ABNORMAL LOW (ref 39.0–52.0)
Hemoglobin: 8.9 g/dL — ABNORMAL LOW (ref 13.0–17.0)
MCH: 30.2 pg (ref 26.0–34.0)
MCHC: 32 g/dL (ref 30.0–36.0)
MCV: 94.2 fL (ref 78.0–100.0)
Platelets: 474 10*3/uL — ABNORMAL HIGH (ref 150–400)
RBC: 2.95 MIL/uL — ABNORMAL LOW (ref 4.22–5.81)
RDW: 13.1 % (ref 11.5–15.5)
WBC: 9.7 10*3/uL (ref 4.0–10.5)

## 2016-09-09 LAB — CULTURE, RESPIRATORY W GRAM STAIN
Culture: NO GROWTH
Culture: NORMAL

## 2016-09-09 LAB — VANCOMYCIN, TROUGH: Vancomycin Tr: 16 ug/mL (ref 15–20)

## 2016-09-09 NOTE — Progress Notes (Addendum)
[address]       [employer] [address]             [phone]      3 Days Post-Op Procedure(s) (LRB): VIDEO ASSISTED THORACOSCOPY (VATS)/DRAIN EMPYEMA (Left) DECORTICATION (Left) VIDEO BRONCHOSCOPY (N/A) Subjective: Feels better, + Productive cough  Objective: Vital signs in last 24 hours: Temp:  [97.5 F (36.4 C)-98.6 F (37 C)] 98.6 F (37 C) (03/04 0800) Pulse Rate:  [67-87] 67 (03/04 0347) Cardiac Rhythm: Normal sinus rhythm (03/04 0800) Resp:  [12-31] 16 (03/04 1000) BP: (103-150)/(66-100) 116/72 (03/04 1000) SpO2:  [65 %-100 %] 98 % (03/04 0817)  Hemodynamic parameters for last 24 hours:    Intake/Output from previous day: 03/03 0701 - 03/04 0700 In: [id number] [P.O.:600; I.V.:210; IV Piggyback:565] Out: 2640 [Urine:2480; Chest Tube:160] Intake/Output this shift: Total I/O In: 120 [P.O.:120] Out: 600 [Urine:600]  General appearance: alert, cooperative and no distress Heart: regular rate and rhythm Lungs: dim in left base, some scattered ronchi Abdomen: benign Extremities: no edema or calf tenderness Wound: incis healing well  Lab Results:  Recent Labs  09/08/16 0430 09/09/16 0256  WBC 12.0* 9.7  HGB 8.8* 8.9*  HCT 27.5* 27.8*  PLT 455* 474*   BMET:  Recent Labs  09/08/16 0430 09/09/16 0256  NA 137 139  K 3.7 4.2  CL 101 104  CO2 29 29  GLUCOSE 139* 133*  BUN 16 15  CREATININE 0.86 0.83  CALCIUM 8.0* 8.0*    PT/INR: No results for input(s): LABPROT, INR in the last 72 hours. ABG    Component Value Date/Time   PHART 7.429 09/07/2016 0344   HCO3 28.6 (H) 09/07/2016 0344   O2SAT 93.9 09/07/2016 0344   CBG (last 3)   Recent Labs  09/08/16 1231 09/08/16 1700 09/08/16 2217  GLUCAP 85 120* 114*    Meds Scheduled Meds: . acetaminophen  1,000 mg Oral Q6H   Or  . acetaminophen (TYLENOL) oral liquid 160 mg/5 mL  1,000 mg Oral Q6H  . balsalazide  750 mg Oral BID  . bisacodyl  10 mg Oral Daily  . Chlorhexidine  Gluconate Cloth  6 each Topical Daily  . enoxaparin (LOVENOX) injection  40 mg Subcutaneous Q24H  . fentaNYL   Intravenous Q4H  . ferrous sulfate  325 mg Oral Q breakfast  . pantoprazole  40 mg Oral Daily  . senna-docusate  1 tablet Oral QHS  . simvastatin  20 mg Oral QPC supper  . sodium chloride flush  10-40 mL Intracatheter Q12H  . vancomycin  750 mg Intravenous Q8H   Continuous Infusions: . dextrose 5 % and 0.9 % NaCl with KCl 20 mEq/L 10 mL/hr at 09/08/16 2300   PRN Meds:.diphenhydrAMINE **OR** diphenhydrAMINE, guaiFENesin, ketorolac, levalbuterol, naloxone **AND** sodium chloride flush, ondansetron (ZOFRAN) IV, ondansetron (ZOFRAN) IV, oxyCODONE, potassium chloride (KCL MULTIRUN) 30 mEq in 265 mL IVPB, sodium chloride flush, traMADol  Xrays Dg Chest Port 1 View  Result Date: 09/09/2016 CLINICAL DATA:  Chest tube in place. EXAM: PORTABLE CHEST 1 VIEW COMPARISON:  09/08/2016 FINDINGS: Right jugular catheter terminates over the SVC. 1 of the left-sided chest tubes has been removed, while the 2 tubes located more inferiorly in the left chest remain in place. No pneumothorax is identified. The cardiac silhouette remains borderline enlarged. There is persistent left lung volume loss with unchanged basilar and slightly improved perihilar opacities. Small left pleural effusion/ pleural thickening extending over the apex is unchanged. The right lung  remains grossly clear. IMPRESSION: 1. Interval removal of 1 left chest tube.  No pneumothorax. 2. Persistent left lung volume loss with unchanged left basilar opacity and minimally improved left perihilar aeration. Electronically Signed   By: Logan Bores M.D.   On: 09/09/2016 08:33   Dg Chest Port 1 View  Result Date: 09/08/2016 CLINICAL DATA:  Empyema EXAM: PORTABLE CHEST 1 VIEW COMPARISON:  Chest radiograph from one day prior. FINDINGS: Right internal jugular central venous catheter terminates in the lower third of the superior vena cava. Stable  positions of 3 left upper chest tubes. Stable cardiomediastinal silhouette with top-normal heart size. No pneumothorax. Stable blunting of the left costophrenic angle. Stable diffuse left pleural thickening. No right pleural effusion. Stable volume loss and patchy opacity throughout the parahilar and basilar left lung. No new consolidative airspace disease. IMPRESSION: 1. No pneumothorax.  Stable left chest tube positions. 2. Stable volume loss and patchy opacity throughout the parahilar and basilar left lung. 3. Stable diffuse left pleural thickening. Electronically Signed   By: Ilona Sorrel M.D.   On: 09/08/2016 08:36    Results for orders placed or performed during the hospital encounter of 09/02/16  MRSA PCR Screening     Status: None   Collection Time: 09/02/16  7:01 PM  Result Value Ref Range Status   MRSA by PCR NEGATIVE NEGATIVE Final    Comment:        The GeneXpert MRSA Assay (FDA approved for NASAL specimens only), is one component of a comprehensive MRSA colonization surveillance program. It is not intended to diagnose MRSA infection nor to guide or monitor treatment for MRSA infections.   Culture, blood (routine x 2)     Status: None (Preliminary result)   Collection Time: 09/04/16  9:46 AM  Result Value Ref Range Status   Specimen Description BLOOD RIGHT HAND  Final   Special Requests IN PEDIATRIC BOTTLE 3ML  Final   Culture NO GROWTH 4 DAYS  Final   Report Status PENDING  Incomplete  Culture, blood (routine x 2)     Status: None (Preliminary result)   Collection Time: 09/04/16  9:49 AM  Result Value Ref Range Status   Specimen Description BLOOD LEFT HAND  Final   Special Requests IN PEDIATRIC BOTTLE 3ML  Final   Culture NO GROWTH 4 DAYS  Final   Report Status PENDING  Incomplete  Respiratory Panel by PCR     Status: None   Collection Time: 09/04/16 11:29 AM  Result Value Ref Range Status   Adenovirus NOT DETECTED NOT DETECTED Final   Coronavirus 229E NOT DETECTED  NOT DETECTED Final   Coronavirus HKU1 NOT DETECTED NOT DETECTED Final   Coronavirus NL63 NOT DETECTED NOT DETECTED Final   Coronavirus OC43 NOT DETECTED NOT DETECTED Final   Metapneumovirus NOT DETECTED NOT DETECTED Final   Rhinovirus / Enterovirus NOT DETECTED NOT DETECTED Final   Influenza A NOT DETECTED NOT DETECTED Final   Influenza B NOT DETECTED NOT DETECTED Final   Parainfluenza Virus 1 NOT DETECTED NOT DETECTED Final   Parainfluenza Virus 2 NOT DETECTED NOT DETECTED Final   Parainfluenza Virus 3 NOT DETECTED NOT DETECTED Final   Parainfluenza Virus 4 NOT DETECTED NOT DETECTED Final   Respiratory Syncytial Virus NOT DETECTED NOT DETECTED Final   Bordetella pertussis NOT DETECTED NOT DETECTED Final   Chlamydophila pneumoniae NOT DETECTED NOT DETECTED Final   Mycoplasma pneumoniae NOT DETECTED NOT DETECTED Final  Surgical pcr screen     Status:  None   Collection Time: 09/05/16 11:32 PM  Result Value Ref Range Status   MRSA, PCR NEGATIVE NEGATIVE Final   Staphylococcus aureus NEGATIVE NEGATIVE Final    Comment:        The Xpert SA Assay (FDA approved for NASAL specimens in patients over 41 years of age), is one component of a comprehensive surveillance program.  Test performance has been validated by [facility] for patients greater than or equal to 31 year old. It is not intended to diagnose infection nor to guide or monitor treatment.   Culture, respiratory (NON-Expectorated)     Status: None   Collection Time: 09/06/16  3:08 PM  Result Value Ref Range Status   Specimen Description PLEURAL WASHINGS LEFT  Final   Special Requests NONE  Final   Gram Stain   Final    RARE WBC PRESENT, PREDOMINANTLY PMN NO ORGANISMS SEEN    Culture NO GROWTH 2 DAYS  Final   Report Status 09/09/2016 FINAL  Final  Culture, respiratory (NON-Expectorated)     Status: None (Preliminary result)   Collection Time: 09/06/16  4:02 PM  Result Value Ref Range Status   Specimen Description  BRONCHIAL WASHINGS  Final   Special Requests NONE  Final   Gram Stain   Final    FEW WBC PRESENT, PREDOMINANTLY PMN RARE GRAM POSITIVE COCCI IN PAIRS    Culture CULTURE REINCUBATED FOR BETTER GROWTH  Final   Report Status PENDING  Incomplete  Acid Fast Smear (AFB)     Status: None   Collection Time: 09/06/16  4:02 PM  Result Value Ref Range Status   AFB Specimen Processing Concentration  Final   Acid Fast Smear Negative  Final    Comment: (NOTE) Performed At: [facility] [place], [place] [id number] Lindon Romp MD [id number]    Source (AFB) PLEURAL  Final    Comment: WASHINGS LEFT   Aerobic Culture (superficial specimen)     Status: None (Preliminary result)   Collection Time: 09/06/16  4:13 PM  Result Value Ref Range Status   Specimen Description TISSUE LEFT PLEURAL  Final   Special Requests NONE  Final   Gram Stain   Final    RARE WBC PRESENT, PREDOMINANTLY PMN NO ORGANISMS SEEN    Culture NO GROWTH 2 DAYS  Final   Report Status PENDING  Incomplete  Acid Fast Smear (AFB)     Status: None   Collection Time: 09/06/16  4:13 PM  Result Value Ref Range Status   AFB Specimen Processing Concentration  Final   Acid Fast Smear Negative  Final    Comment: (NOTE) Performed At: [facility] [place], [place] [id number] Lindon Romp MD [id number]    Source (AFB) TISSUE  Final    Comment: LEFT PLEURAL   Aerobic Culture (superficial specimen)     Status: None (Preliminary result)   Collection Time: 09/06/16  4:49 PM  Result Value Ref Range Status   Specimen Description TISSUE LEFT PLEURAL  Final   Special Requests NONE  Final   Gram Stain   Final    MODERATE WBC PRESENT, PREDOMINANTLY PMN RARE GRAM POSITIVE COCCI IN PAIRS    Culture NO GROWTH 2 DAYS  Final   Report Status PENDING  Incomplete  Acid Fast Smear (AFB)     Status: None   Collection Time: 09/06/16  4:49 PM  Result Value Ref Range Status   AFB  Specimen Processing Concentration  Final  Acid Fast Smear Negative  Final    Comment: (NOTE) Performed At: [facility] [place], [place] [id number] Lindon Romp MD [id number]    Source (AFB) TISSUE  Final    Comment: LEFT PLEURAL      Assessment/Plan: S/P Procedure(s) (LRB): VIDEO ASSISTED THORACOSCOPY (VATS)/DRAIN EMPYEMA (Left) DECORTICATION (Left) VIDEO BRONCHOSCOPY (N/A)  1 chest tube with small air leak with cough, 110 cc out yesterday, poss d/c another tube today 2 cont vanco 3 cont pulm toilet/rehab as able 4 no further leukocytosis, H/H is stable 5 renal fxn normal- excellent urine output   LOS: 7 days    GOLD,WAYNE E 09/09/2016  Patient continues to progress Second chest tube removed today Chest x-ray stable Patient afebrile Continue current care patient examined and medical record reviewed,agree with above note. Tharon Aquas Trigt III 09/09/2016

## 2016-09-09 NOTE — Progress Notes (Signed)
Pharmacy Antibiotic Note  [name] is a 61 y.o. male admitted on 09/02/2016 with empyema.  Pharmacy has been consulted for vancomycin dosing. Loculated L effusion, left empyema s/p VATS and drainage of empyema on 3/1.   Vancomycin dose was decreased to 750mg  IV q8h. A vancomycin trough drawn this evening is therapeutic at 34mcg/mL  Plan: Continue vancomycin 750 mg IV q8h.  Goal trough 15-20 mcg/mL.  F/u renal fxn, wbc, temp, culture data  Height: 5\' 6"  (167.6 cm) Weight: 174 lb 12.8 oz (79.3 kg) IBW/kg (Calculated) : 63.8  Temp (24hrs), Avg:98.5 F (36.9 C), Min:97.8 F (36.6 C), Max:98.9 F (37.2 C)   Recent Labs Lab 09/03/16 0220  09/05/16 0443 09/06/16 0448 09/07/16 0508 09/08/16 0430 09/09/16 0256 09/09/16 2048  WBC 15.3*  < > 17.2* 15.1* 17.9* 12.0* 9.7  --   CREATININE 0.88  --  0.80  --  0.73 0.86 0.83  --   VANCOTROUGH  --   --   --   --   --   --   --  16  < > = values in this interval not displayed.  Estimated Creatinine Clearance: 93.7 mL/min (by C-G formula based on SCr of 0.83 mg/dL).    Allergies  Allergen Reactions  . Sulfamethoxazole     REACTION: unspecified    Antimicrobials this admission: 2/24 levaquin x 1 2/25 azith >>2/28 2/25 CXT >>3/3 2/25 vanc x 1 dose; 3/1>>  Dose adjustments this admission: 3/2- vancomycin decreased to 750mg  IV q8h 3/4- vancomycin trough therapeutic at 64mcg/mL on 750mg  q8h  Microbiology results: 3/1 resp cx: nml flora 3/1 lung tissue: neg 2/28 MRSA PCR: neg 2/27 Resp panel: neg 2/27 BCx2: neg  Thank you for allowing pharmacy to be a part of this patient's care.  Irisa Grimsley D. Wilfrid Hyser, PharmD, BCPS Clinical Pharmacist Pager: [phone] 09/09/2016 9:44 PM

## 2016-09-09 NOTE — Progress Notes (Signed)
Removed anterior tube of remaining two chest tube per MD order. Patient tolerated procedure well. Bedside nurse aware of removal.

## 2016-09-10 ENCOUNTER — Inpatient Hospital Stay (HOSPITAL_COMMUNITY): Payer: BLUE CROSS/BLUE SHIELD

## 2016-09-10 MED ORDER — DEXTROSE 5 % IV SOLN
1.0000 g | INTRAVENOUS | Status: DC
  Administered 2016-09-10 – 2016-09-11 (×2): 1 g via INTRAVENOUS
  Filled 2016-09-10 (×2): qty 10

## 2016-09-10 NOTE — Discharge Instructions (Signed)
Community-Acquired Pneumonia, Adult Pneumonia is an infection of the lungs. There are different types of pneumonia. One type can develop while a person is in a hospital. A different type, called community-acquired pneumonia, develops in people who are not, or have not recently been, in the hospital or other health care facility. What are the causes? Pneumonia may be caused by bacteria, viruses, or funguses. Community-acquired pneumonia is often caused by Streptococcus pneumonia bacteria. These bacteria are often passed from one person to another by breathing in droplets from the cough or sneeze of an infected person. What increases the risk? The condition is more likely to develop in:  People who havechronic diseases, such as chronic obstructive pulmonary disease (COPD), asthma, congestive heart failure, cystic fibrosis, diabetes, or kidney disease.  People who haveearly-stage or late-stage HIV.  People who havesickle cell disease.  People who havehad their spleen removed (splenectomy).  People who havepoor Human resources officer.  People who havemedical conditions that increase the risk of breathing in (aspirating) secretions their own mouth and nose.  People who havea weakened immune system (immunocompromised).  People who smoke.  People whotravel to areas where pneumonia-causing germs commonly exist.  People whoare around animal habitats or animals that have pneumonia-causing germs, including birds, bats, rabbits, cats, and farm animals. What are the signs or symptoms? Symptoms of this condition include:  Adry cough.  A wet (productive) cough.  Fever.  Sweating.  Chest pain, especially when breathing deeply or coughing.  Rapid breathing or difficulty breathing.  Shortness of breath.  Shaking chills.  Fatigue.  Muscle aches. How is this diagnosed? Your health care provider will take a medical history and perform a physical exam. You may also have other tests,  including:  Imaging studies of your chest, including X-rays.  Tests to check your blood oxygen level and other blood gases.  Other tests on blood, mucus (sputum), fluid around your lungs (pleural fluid), and urine. If your pneumonia is severe, other tests may be done to identify the specific cause of your illness. How is this treated? The type of treatment that you receive depends on many factors, such as the cause of your pneumonia, the medicines you take, and other medical conditions that you have. For most adults, treatment and recovery from pneumonia may occur at home. In some cases, treatment must happen in a hospital. Treatment may include:  Antibiotic medicines, if the pneumonia was caused by bacteria.  Antiviral medicines, if the pneumonia was caused by a virus.  Medicines that are given by mouth or through an IV tube.  Oxygen.  Respiratory therapy. Although rare, treating severe pneumonia may include:  Mechanical ventilation. This is done if you are not breathing well on your own and you cannot maintain a safe blood oxygen level.  Thoracentesis. This procedureremoves fluid around one lung or both lungs to help you breathe better. Follow these instructions at home:  Take over-the-counter and prescription medicines only as told by your health care provider.  Only takecough medicine if you are losing sleep. Understand that cough medicine can prevent your bodys natural ability to remove mucus from your lungs.  If you were prescribed an antibiotic medicine, take it as told by your health care provider. Do not stop taking the antibiotic even if you start to feel better.  Sleep in a semi-upright position at night. Try sleeping in a reclining chair, or place a few pillows under your head.  Do not use tobacco products, including cigarettes, chewing tobacco, and e-cigarettes. If you  need help quitting, ask your health care provider.  Drink enough water to keep your urine clear  or pale yellow. This will help to thin out mucus secretions in your lungs. How is this prevented? There are ways that you can decrease your risk of developing community-acquired pneumonia. Consider getting a pneumococcal vaccine if:  You are older than 61 years of age.  You are older than 61 years of age and are undergoing cancer treatment, have chronic lung disease, or have other medical conditions that affect your immune system. Ask your health care provider if this applies to you. There are different types and schedules of pneumococcal vaccines. Ask your health care provider which vaccination option is best for you. You may also prevent community-acquired pneumonia if you take these actions:  Get an influenza vaccine every year. Ask your health care provider which type of influenza vaccine is best for you.  Go to the dentist on a regular basis.  Wash your hands often. Use hand sanitizer if soap and water are not available. Contact a health care provider if:  You have a fever.  You are losing sleep because you cannot control your cough with cough medicine. Get help right away if:  You have worsening shortness of breath.  You have increased chest pain.  Your sickness becomes worse, especially if you are an older adult or have a weakened immune system.  You cough up blood. This information is not intended to replace advice given to you by your health care provider. Make sure you discuss any questions you have with your health care provider. Document Released: 06/25/2005 Document Revised: 11/03/2015 Document Reviewed: 10/20/2014 Elsevier Interactive Patient Education  2017 [employer] [facility], Care After This sheet gives you information about how to care for yourself after your procedure. Your doctor may also give you more specific instructions. If you have problems or questions, contact your doctor. What can I expect after the procedure? After the  procedure, it is common to have:  Some pain and soreness in your chest.  Pain when you breathe in (inhale) and cough.  Trouble pooping (constipation).  Tiredness (fatigue).  Trouble sleeping. Follow these instructions at home: Preventing lung infection (  pneumonia)  Take deep breaths or do breathing exercises as told by your doctor.  Cough often. Coughing is important to clear thick spit (phlegm) and open your lungs.  You can make coughing hurt less if you try supporting (splinting) your chest. Try one of these when you cough:  Hold a pillow against your chest.  Place both hands flat on top of your cut.  Use an incentive spirometer as told by your doctor. This is a tool that measures how well you can fill your lungs with each breath.  Do lung therapy (pulmonary rehabilitation) as told. Medicines  Take over-the-counter or prescription medicines only as told by your doctor.  If you have pain, take pain-relieving medicine before your pain gets very bad. Doing this will help you breathe and cough more comfortably.  If you were prescribed an antibiotic medicine, take it as told by your doctor. Do not stop taking the antibiotic even if you start to feel better. Activity  Ask your doctor what activities are safe for you.  Avoid activities that use your chest muscles for 3-4 [place] or longer.  Do not lift anything that is heavier than 10 lb (4.5 kg), or the limit that your doctor tells you, until he or she says that it is  safe. Cut ( incision) care  Follow instructions from your doctor about how to take care of your cut(s) from surgery. Make sure you:  Wash your hands with soap and water before you change your bandage (dressing). If you cannot use soap and water, use hand sanitizer.  Change your bandage as told by your doctor.  Leave stitches (sutures), skin glue, or skin tape (adhesive) strips in place. They may need to stay in place for 2 [place] or longer. If tape strips  get loose and curl up, you may trim the loose edges. Do not remove tape strips completely unless your doctor says it is okay.  Keep your bandage dry until it has been removed.  Every day, check the area around your cut(s) for signs of infection. Check for:  Redness, swelling, or pain.  Fluid or blood.  Warmth.  Pus or a bad smell. Bathing  Do not take baths, swim, or use a hot tub until your doctor approves. You may take showers.  After your bandage is removed, use soap and water to gently wash the your cut(s) from surgery. Do not use anything else to clean your cut(s) unless your doctor tells you to do this. Driving  Do not drive until your doctor approves.  Do not drive or use heavy machinery while taking prescription pain medicine. Eating and drinking  Eat a healthy diet as told by your doctor. A healthy diet includes:  Lots of fresh fruits and vegetables.  Whole grains.  Low-fat (lean) proteins.  Limit foods that are high in fat and processed sugars. These include fried and sweet foods.  Drink enough fluid to keep your pee (urine) clear or light yellow. General instructions  To prevent or treat trouble pooping while you are taking prescription pain medicine, your doctor may recommend that you:  Take over-the-counter or prescription medicines.  Eat foods that have a lot of fiber. These include beans, fresh fruits and vegetables, and whole grains.  Do not use any products that contain nicotine or tobacco. These include cigarettes and e-cigarettes. If you need help quitting, ask your doctor.  Avoid being where people are smoking (avoid secondhand smoke).  Wear compression stockings as told by your doctor. These stockings help you:  Not get blood clots in your legs.  Have less swelling in your legs.  If you have a chest tube, care for it as told by your doctor.  Do not travel by airplane during the 2 [place] after your chest tube is removed, or until your doctor  says that this is safe.  Keep all follow-up visits as told by your doctor. This is important. Contact a doctor if:  You have redness, swelling, or pain around a cut from surgery.  You have fluid or blood coming from a cut from surgery.  Your cut(s) from surgery feel warm to the touch.  You have pus or a bad smell coming from a cut from surgery.  You have a fever or chills.  You feel sick to your stomach (nauseous).  You throw up (vomit).  You have pain that does not get better with medicine. Get help right away if:  You have chest pain.  Your heart is fluttering or beating fast.  You start to have a rash.  You have shortness of breath.  You have trouble breathing.  You are confused.  You have trouble talking or understanding.  You feel weak, light-headed, or dizzy.  You faint. Summary  To help prevent lung infection (  pneumonia), take deep breaths or do breathing exercises as told by your doctor.  Cough often. This is important for clearing chest fluid (phlegm). You can make coughing hurt less if you hold a pillow to your chest or put your hands flat on the cut(s) when you cough (do splinting).  Do not drive until your doctor approves.  Every day, check your cut(s) for signs of infection. These signs can be redness, swelling, pain, fluid, blood, warmth, pus, or a bad smell.  Eat a healthy diet. This includes lots of fresh fruits and vegetables, whole grains, and low-fat (lean) proteins. This information is not intended to replace advice given to you by your health care provider. Make sure you discuss any questions you have with your health care provider. Document Released: 10/20/2012 Document Revised: 06/04/2016 Document Reviewed: 06/04/2016 Elsevier Interactive Patient Education  2017 [employer].

## 2016-09-10 NOTE — Progress Notes (Signed)
[address]       [place] [id number]             [phone]      4 Days Post-Op Procedure(s) (LRB): VIDEO ASSISTED THORACOSCOPY (VATS)/DRAIN EMPYEMA (Left) DECORTICATION (Left) VIDEO BRONCHOSCOPY (N/A) Subjective: conts to feel better, cough less productive, SOB improved, using less PCA  Objective: Vital signs in last 24 hours: Temp:  [98 F (36.7 C)-98.9 F (37.2 C)] 98.9 F (37.2 C) (03/05 0800) Pulse Rate:  [72-84] 72 (03/05 0800) Cardiac Rhythm: Normal sinus rhythm (03/05 0800) Resp:  [11-23] 11 (03/05 0753) BP: (103-130)/(72-97) 118/74 (03/05 0401) SpO2:  [94 %-98 %] 95 % (03/05 0800) Weight:  [175 lb 11.3 oz (79.7 kg)] 175 lb 11.3 oz (79.7 kg) (03/05 0617)  Hemodynamic parameters for last 24 hours:    Intake/Output from previous day: 03/04 0701 - 03/05 0700 In: 1580 [P.O.:480; I.V.:350; IV Piggyback:750] Out: [id number] [Urine:2500; Chest Tube:70] Intake/Output this shift: No intake/output data recorded.  General appearance: alert, cooperative and no distress Heart: regular rate and rhythm Lungs: dim left base Abdomen: benign Extremities: no edema Wound: incis healing well  Lab Results:  Recent Labs  09/08/16 0430 09/09/16 0256  WBC 12.0* 9.7  HGB 8.8* 8.9*  HCT 27.5* 27.8*  PLT 455* 474*   BMET:  Recent Labs  09/08/16 0430 09/09/16 0256  NA 137 139  K 3.7 4.2  CL 101 104  CO2 29 29  GLUCOSE 139* 133*  BUN 16 15  CREATININE 0.86 0.83  CALCIUM 8.0* 8.0*    PT/INR: No results for input(s): LABPROT, INR in the last 72 hours. ABG    Component Value Date/Time   PHART 7.429 09/07/2016 0344   HCO3 28.6 (H) 09/07/2016 0344   O2SAT 93.9 09/07/2016 0344   CBG (last 3)   Recent Labs  09/08/16 1231 09/08/16 1700 09/08/16 2217  GLUCAP 85 120* 114*    Meds Scheduled Meds: . acetaminophen  1,000 mg Oral Q6H   Or  . acetaminophen (TYLENOL) oral liquid 160 mg/5 mL  1,000 mg Oral Q6H  . balsalazide  750 mg Oral BID    . bisacodyl  10 mg Oral Daily  . Chlorhexidine Gluconate Cloth  6 each Topical Daily  . enoxaparin (LOVENOX) injection  40 mg Subcutaneous Q24H  . fentaNYL   Intravenous Q4H  . ferrous sulfate  325 mg Oral Q breakfast  . pantoprazole  40 mg Oral Daily  . senna-docusate  1 tablet Oral QHS  . simvastatin  20 mg Oral QPC supper  . sodium chloride flush  10-40 mL Intracatheter Q12H  . vancomycin  750 mg Intravenous Q8H   Continuous Infusions: . dextrose 5 % and 0.9 % NaCl with KCl 20 mEq/L 10 mL/hr at 09/10/16 0700   PRN Meds:.diphenhydrAMINE **OR** diphenhydrAMINE, guaiFENesin, ketorolac, levalbuterol, naloxone **AND** sodium chloride flush, ondansetron (ZOFRAN) IV, ondansetron (ZOFRAN) IV, oxyCODONE, potassium chloride (KCL MULTIRUN) 30 mEq in 265 mL IVPB, sodium chloride flush, traMADol  Xrays Dg Chest Port 1 View  Result Date: 09/10/2016 CLINICAL DATA:  Followup left-sided chest tube. EXAM: PORTABLE CHEST 1 VIEW COMPARISON:  Chest x-ray 09/09/2016 and chest CT 09/04/2016 FINDINGS: The heart is within normal limits in size and stable. Mild tortuosity of the thoracic aorta. The right IJ catheter has been removed. The more basilar pleural drainage catheter has been removed. The other catheter is still in place. Small residual pleural fluid or pleural thickening and edema and atelectasis in  the left lung. The right lung remains clear. No pneumothorax. IMPRESSION: 1. Removal of right IJ central venous catheter and removal of the left-sided chest tubes. 2. Persistent small pleural effusion versus pleural thickening and persistent edema and atelectasis in the left lung. Electronically Signed   By: Marijo Sanes M.D.   On: 09/10/2016 07:59   Dg Chest Port 1 View  Result Date: 09/09/2016 CLINICAL DATA:  Chest tube in place. EXAM: PORTABLE CHEST 1 VIEW COMPARISON:  09/08/2016 FINDINGS: Right jugular catheter terminates over the SVC. 1 of the left-sided chest tubes has been removed, while the 2 tubes  located more inferiorly in the left chest remain in place. No pneumothorax is identified. The cardiac silhouette remains borderline enlarged. There is persistent left lung volume loss with unchanged basilar and slightly improved perihilar opacities. Small left pleural effusion/ pleural thickening extending over the apex is unchanged. The right lung remains grossly clear. IMPRESSION: 1. Interval removal of 1 left chest tube.  No pneumothorax. 2. Persistent left lung volume loss with unchanged left basilar opacity and minimally improved left perihilar aeration. Electronically Signed   By: Logan Bores M.D.   On: 09/09/2016 08:33   Results for orders placed or performed during the hospital encounter of 09/02/16  MRSA PCR Screening     Status: None   Collection Time: 09/02/16  7:01 PM  Result Value Ref Range Status   MRSA by PCR NEGATIVE NEGATIVE Final    Comment:        The GeneXpert MRSA Assay (FDA approved for NASAL specimens only), is one component of a comprehensive MRSA colonization surveillance program. It is not intended to diagnose MRSA infection nor to guide or monitor treatment for MRSA infections.   Culture, blood (routine x 2)     Status: None   Collection Time: 09/04/16  9:46 AM  Result Value Ref Range Status   Specimen Description BLOOD RIGHT HAND  Final   Special Requests IN PEDIATRIC BOTTLE 3ML  Final   Culture NO GROWTH 5 DAYS  Final   Report Status 09/09/2016 FINAL  Final  Culture, blood (routine x 2)     Status: None   Collection Time: 09/04/16  9:49 AM  Result Value Ref Range Status   Specimen Description BLOOD LEFT HAND  Final   Special Requests IN PEDIATRIC BOTTLE 3ML  Final   Culture NO GROWTH 5 DAYS  Final   Report Status 09/09/2016 FINAL  Final  Respiratory Panel by PCR     Status: None   Collection Time: 09/04/16 11:29 AM  Result Value Ref Range Status   Adenovirus NOT DETECTED NOT DETECTED Final   Coronavirus 229E NOT DETECTED NOT DETECTED Final    Coronavirus HKU1 NOT DETECTED NOT DETECTED Final   Coronavirus NL63 NOT DETECTED NOT DETECTED Final   Coronavirus OC43 NOT DETECTED NOT DETECTED Final   Metapneumovirus NOT DETECTED NOT DETECTED Final   Rhinovirus / Enterovirus NOT DETECTED NOT DETECTED Final   Influenza A NOT DETECTED NOT DETECTED Final   Influenza B NOT DETECTED NOT DETECTED Final   Parainfluenza Virus 1 NOT DETECTED NOT DETECTED Final   Parainfluenza Virus 2 NOT DETECTED NOT DETECTED Final   Parainfluenza Virus 3 NOT DETECTED NOT DETECTED Final   Parainfluenza Virus 4 NOT DETECTED NOT DETECTED Final   Respiratory Syncytial Virus NOT DETECTED NOT DETECTED Final   Bordetella pertussis NOT DETECTED NOT DETECTED Final   Chlamydophila pneumoniae NOT DETECTED NOT DETECTED Final   Mycoplasma pneumoniae NOT DETECTED NOT  DETECTED Final  Surgical pcr screen     Status: None   Collection Time: 09/05/16 11:32 PM  Result Value Ref Range Status   MRSA, PCR NEGATIVE NEGATIVE Final   Staphylococcus aureus NEGATIVE NEGATIVE Final    Comment:        The Xpert SA Assay (FDA approved for NASAL specimens in patients over 35 years of age), is one component of a comprehensive surveillance program.  Test performance has been validated by [facility] for patients greater than or equal to 54 year old. It is not intended to diagnose infection nor to guide or monitor treatment.   Culture, respiratory (NON-Expectorated)     Status: None   Collection Time: 09/06/16  3:08 PM  Result Value Ref Range Status   Specimen Description PLEURAL WASHINGS LEFT  Final   Special Requests NONE  Final   Gram Stain   Final    RARE WBC PRESENT, PREDOMINANTLY PMN NO ORGANISMS SEEN    Culture NO GROWTH 2 DAYS  Final   Report Status 09/09/2016 FINAL  Final  Culture, respiratory (NON-Expectorated)     Status: None   Collection Time: 09/06/16  4:02 PM  Result Value Ref Range Status   Specimen Description BRONCHIAL WASHINGS  Final   Special Requests  NONE  Final   Gram Stain   Final    FEW WBC PRESENT, PREDOMINANTLY PMN RARE GRAM POSITIVE COCCI IN PAIRS    Culture Consistent with normal respiratory flora.  Final   Report Status 09/09/2016 FINAL  Final  Acid Fast Smear (AFB)     Status: None   Collection Time: 09/06/16  4:02 PM  Result Value Ref Range Status   AFB Specimen Processing Concentration  Final   Acid Fast Smear Negative  Final    Comment: (NOTE) Performed At: [facility] [place], [place] [id number] Lindon Romp MD [id number]    Source (AFB) PLEURAL  Final    Comment: WASHINGS LEFT   Aerobic Culture (superficial specimen)     Status: None   Collection Time: 09/06/16  4:13 PM  Result Value Ref Range Status   Specimen Description TISSUE LEFT PLEURAL  Final   Special Requests NONE  Final   Gram Stain   Final    RARE WBC PRESENT, PREDOMINANTLY PMN NO ORGANISMS SEEN    Culture NO GROWTH 3 DAYS  Final   Report Status 09/09/2016 FINAL  Final  Acid Fast Smear (AFB)     Status: None   Collection Time: 09/06/16  4:13 PM  Result Value Ref Range Status   AFB Specimen Processing Concentration  Final   Acid Fast Smear Negative  Final    Comment: (NOTE) Performed At: [facility] [place], [place] [id number] Lindon Romp MD [id number]    Source (AFB) TISSUE  Final    Comment: LEFT PLEURAL   Aerobic Culture (superficial specimen)     Status: None   Collection Time: 09/06/16  4:49 PM  Result Value Ref Range Status   Specimen Description TISSUE LEFT PLEURAL  Final   Special Requests NONE  Final   Gram Stain   Final    MODERATE WBC PRESENT, PREDOMINANTLY PMN RARE GRAM POSITIVE COCCI IN PAIRS    Culture NO GROWTH 3 DAYS  Final   Report Status 09/09/2016 FINAL  Final  Acid Fast Smear (AFB)     Status: None   Collection Time: 09/06/16  4:49 PM  Result Value Ref Range Status  AFB Specimen Processing Concentration  Final   Acid Fast Smear Negative  Final      Comment: (NOTE) Performed At: [facility] [place], [place] [id number] Lindon Romp MD [id number]    Source (AFB) TISSUE  Final    Comment: LEFT PLEURAL    Assessment/Plan: S/P Procedure(s) (LRB): VIDEO ASSISTED THORACOSCOPY (VATS)/DRAIN EMPYEMA (Left) DECORTICATION (Left) VIDEO BRONCHOSCOPY (N/A)  1 conts with good progress 2 chest tube with no air leak, 100cc out yesterday- poss d/c tube today 3 transition to po abx soon, no fevers. Hemodyn stable      LOS: 8 days    [name] E 09/10/2016

## 2016-09-10 NOTE — Discharge Summary (Signed)
Physician Discharge Summary  Patient ID: [name] MRN: [id number] DOB/AGE: 09/23/1955 61 y.o.  Admit date: 09/02/2016 Discharge date: 09/11/2016  Admission Diagnoses:Loculated pleural effusion/empyema  Discharge Diagnoses:  Principal Problem:   Community acquired pneumonia of left lower lobe of lung ([place]) Active Problems:   Prostate cancer ([place])   Ulcerative colitis, unspecified, without complications (HCC)   Pleural effusion, left   Normocytic anemia   Opacity of lung on imaging study   Loculated pleural effusion   Hypoxemia   Empyema, left [facility])  Patient Active Problem List   Diagnosis Date Noted  . Empyema, left ([place]) 09/06/2016  . Opacity of lung on imaging study   . Loculated pleural effusion   . Hypoxemia   . Community acquired pneumonia of left lower lobe of lung ([place]) 09/02/2016  . Pleural effusion, left 09/02/2016  . Normocytic anemia 09/02/2016  . Viral URI with cough 08/01/2016  . Rectal bleeding 05/31/2015  . Ulcerative colitis, unspecified, without complications ([place]) [id number]  . Cellulitis and abscess of trunk 04/20/2014  . Prostate cancer ([place]) 03/16/2014  . Cutaneous fungal infection 08/20/2013  . Boil, ear 05/12/2013  . PSA elevation 10/21/2012  . Urticaria due to food allergy 01/02/2012  . Hearing loss sensory, bilateral 07/17/2011  . Herpes simplex 09/05/2010  . BENIGN NEOPLASM SKIN OTHER&UNSPEC PARTS FACE 09/06/2008  . OBSTRUCTIVE SLEEP APNEA 08/29/2007  . FAMILIAL COMBINED HYPERLIPIDEMIA 07/08/2007    HPI: at time of presentation to the ER  [name] is a 61 y.o. male with a past medical history significant for prostate cancer, metastatic to bone, stable, and ulcerative colitis who presents with cough, chest pain.  The patient was in his usual state of health until about 5 [place] ago when he developed a "slight case of the flu", followed by a "lingering cough". In the last week, he is noticing he is MUCH more tired than usual, had an  increased cough, dyspnea on exertion, night sweats once, and vague malaise.  Yesterday morning, he woke up with severe left flank/rib pain, almost making it difficult to breathe, so he went to the emergency room. There he had a slight leukocytosis, and a CT angiogram of the chest showed no PE with small left effusion and infiltrate. He was discharged with Levaquin for pneumonia and Vicodin for pain.  The Vicodin helped somewhat, but today he had new pain across his back and in his chest, moderate in intensity, worse with inspiration or laying back, and so he returned to the ER.  Discharged Condition: good  Hospital Course: The patient was admitted to the emergency department to the hospitalist service for further management and treatment. He was started on ceftriaxone and azithromycin. A thoracentesis was performed by interventional radiology. A pulmonology consultation was also obtained to assist with management. His empyema was felt to be advancing despite intravenous antibiotics and a thoracic surgical consultation was obtained with [name] who felt the patient benefit from surgical drainage and decortication. On 09/06/2016 he was taken the operating room where he underwent the below described procedure. He tolerated well was taken to the postanesthesia care unit in stable condition.  Postoperative hospital course:  The patient is shown a good gradual overall progression. Chest tubes have been discontinued over time in a standard fashion as drainage decreases. Cultures have been unremarkable with exception of Gram stain findings gram-positive cocci. She was initially kept on Vanco and ceftriaxone but subsequently transitioned to vancomycin. At time of discharge oral augmentin is recommended for  3 [place] per ID consultant.  His incision is healing well without evidence of infection. He is tolerating gradually increasing activities using standard protocols. Oxygen has been weaned and he  maintains adequate saturations on room air. At time of discharge she is felt to be quite stable.  Consults: pulmonary/intensive care  Significant Diagnostic Studies: chest CT/angio  Treatments: antibiotics: vancomycin, ceftriaxone and azithromycin and surgery: NormalYSICIAN:  Remo Lipps C. Roxan Hockey, M.D.DATE OF BIRTH:  1956/04/22  DATE OF PROCEDURE:  09/06/2016 DATE OF DISCHARGE:                              OPERATIVE REPORT   PREOPERATIVE DIAGNOSIS:  Left empyema.  POSTOPERATIVE DIAGNOSIS:  Left empyema.  PROCEDURE:  Bronchoscopy, left video-assisted thoracoscopy, drainage of empyema, visceral and parietal pleural decortication.  SURGEON:  Revonda Standard. Roxan Hockey, M.D.  ASSISTANT:  Lars Pinks, P.A.  ANESTHESIA:  General.  FINDINGS:  Bronchoscopy, normal endobronchial anatomy, some edema, but no endobronchial lesions seen, VATS, early organizing empyema with extensive visceral and parietal pleural peel, good re-expansion of both lobes after decortication. Discharge Exam: Blood pressure 109/79, pulse 65, temperature 98.6 F (37 C), temperature source Oral, resp. rate 14, height 5\' 6"  (1.676 m), weight 176 lb 14.4 oz (80.2 kg), SpO2 95 %.   General appearance: alert, cooperative and no distress Heart: regular rate and rhythm Lungs: mildly dim i right base Abdomen: benign Extremities: no calf tenderness Wound: healing well    Disposition: 01-Home or Self Care   Allergies as of 09/11/2016      Reactions   Sulfamethoxazole    REACTION: unspecified      Medication List    STOP taking these medications   HYDROcodone-acetaminophen 5-325 MG tablet Commonly known as:  NORCO/VICODIN   HYDROcodone-homatropine 5-1.5 MG/5ML syrup Commonly known as:  HYCODAN     TAKE these medications   amoxicillin-clavulanate 875-125 MG tablet Commonly known as:  AUGMENTIN Take 1 tablet by mouth 2 (two) times daily.   balsalazide 750 MG capsule Commonly known as:   COLAZAL Take 750 mg by mouth 2 (two) times daily.   ferrous sulfate 325 (65 FE) MG tablet Take 325 mg by mouth daily with breakfast.   fish oil-omega-3 fatty acids 1000 MG capsule Take 2 g by mouth daily.   glucosamine-chondroitin 500-400 MG tablet Take 1 tablet by mouth 3 (three) times daily.   guaiFENesin 600 MG 12 hr tablet Commonly known as:  MUCINEX Take 2 tablets (1,200 mg total) by mouth 2 (two) times daily as needed for to loosen phlegm.   Lycopene 10 MG Caps Take 10 mg by mouth.   multivitamin tablet Take 1 tablet by mouth daily.   naproxen 500 MG tablet Commonly known as:  NAPROSYN Take 1 tablet (500 mg total) by mouth 2 (two) times daily.   oxyCODONE 5 MG immediate release tablet Commonly known as:  Oxy IR/ROXICODONE Take 1-2 tablets (5-10 mg total) by mouth every 6 (six) hours as needed for severe pain.   simvastatin 20 MG tablet Commonly known as:  ZOCOR Take 20 mg by mouth daily after supper.   valACYclovir 500 MG tablet Commonly known as:  VALTREX Take 500 mg by mouth 2 (two) times daily. PRN   VITAMIN B 12 PO Take by mouth.   Vitamin D 2000 units tablet Take 4,000 Units by mouth daily.      Follow-up Information    [name], MD Follow up.  Specialty:  Cardiothoracic Surgery Why:  Please see discharge paperwork. Please obtain a chest x-ray from [facility] imaging one half hour prior to your appointment with Dr. Roxan Hockey. [facility] is located in the same office complex. Contact information: [address] [place] [address] [phone]           Signed: John Giovanni 09/11/2016, 10:39 AM

## 2016-09-11 ENCOUNTER — Inpatient Hospital Stay (HOSPITAL_COMMUNITY): Payer: BLUE CROSS/BLUE SHIELD

## 2016-09-11 DIAGNOSIS — Z87891 Personal history of nicotine dependence: Secondary | ICD-10-CM

## 2016-09-11 DIAGNOSIS — Z833 Family history of diabetes mellitus: Secondary | ICD-10-CM

## 2016-09-11 DIAGNOSIS — Z882 Allergy status to sulfonamides status: Secondary | ICD-10-CM

## 2016-09-11 DIAGNOSIS — Z8249 Family history of ischemic heart disease and other diseases of the circulatory system: Secondary | ICD-10-CM

## 2016-09-11 DIAGNOSIS — Z801 Family history of malignant neoplasm of trachea, bronchus and lung: Secondary | ICD-10-CM

## 2016-09-11 DIAGNOSIS — Z800 Family history of malignant neoplasm of digestive organs: Secondary | ICD-10-CM

## 2016-09-11 DIAGNOSIS — B9689 Other specified bacterial agents as the cause of diseases classified elsewhere: Secondary | ICD-10-CM

## 2016-09-11 DIAGNOSIS — Z803 Family history of malignant neoplasm of breast: Secondary | ICD-10-CM

## 2016-09-11 DIAGNOSIS — C61 Malignant neoplasm of prostate: Secondary | ICD-10-CM

## 2016-09-11 DIAGNOSIS — J869 Pyothorax without fistula: Secondary | ICD-10-CM

## 2016-09-11 DIAGNOSIS — Z8349 Family history of other endocrine, nutritional and metabolic diseases: Secondary | ICD-10-CM

## 2016-09-11 LAB — BASIC METABOLIC PANEL
ANION GAP: 9 (ref 5–15)
BUN: 16 mg/dL (ref 6–20)
CALCIUM: 8.5 mg/dL — AB (ref 8.9–10.3)
CHLORIDE: 102 mmol/L (ref 101–111)
CO2: 29 mmol/L (ref 22–32)
CREATININE: 0.88 mg/dL (ref 0.61–1.24)
GFR calc Af Amer: >60 mL/min (ref >60)
GFR calc non Af Amer: >60 mL/min (ref >60)
GLUCOSE: 115 mg/dL — AB (ref 65–99)
Potassium: 4 mmol/L (ref 3.5–5.1)
Sodium: 140 mmol/L (ref 135–145)

## 2016-09-11 MED ORDER — OXYCODONE HCL 5 MG PO TABS: 5.0000 mg | ORAL_TABLET | Freq: Four times a day (QID) | ORAL | 0 refills | Status: DC | PRN

## 2016-09-11 MED ORDER — AMOXICILLIN-POT CLAVULANATE 875-125 MG PO TABS: 1.0000 | ORAL_TABLET | Freq: Two times a day (BID) | ORAL | 0 refills | Status: DC

## 2016-09-11 MED ORDER — GUAIFENESIN ER 600 MG PO TB12: 1200.0000 mg | ORAL_TABLET | Freq: Two times a day (BID) | ORAL | 0 refills | Status: DC | PRN

## 2016-09-11 NOTE — Progress Notes (Signed)
[address]       [place] [address]             [phone]      5 Days Post-Op Procedure(s) (LRB): VIDEO ASSISTED THORACOSCOPY (VATS)/DRAIN EMPYEMA (Left) DECORTICATION (Left) VIDEO BRONCHOSCOPY (N/A) Subjective: Feels tired this am and having a bit more cough  Objective: Vital signs in last 24 hours: Temp:  [97.4 F (36.3 C)-99.1 F (37.3 C)] 98 F (36.7 C) (03/06 0444) Pulse Rate:  [75] 75 (03/06 0444) Cardiac Rhythm: Normal sinus rhythm;Bundle branch block (03/06 0444) Resp:  [16-21] 17 (03/06 0444) BP: (122-138)/(67-83) 122/67 (03/06 0444) SpO2:  [96 %-99 %] 99 % (03/06 0400) Weight:  [176 lb 14.4 oz (80.2 kg)] 176 lb 14.4 oz (80.2 kg) (03/06 0410)  Hemodynamic parameters for last 24 hours:    Intake/Output from previous day: 03/05 0701 - 03/06 0700 In: 980 [P.O.:240; I.V.:240; IV Piggyback:500] Out: 1100 [Urine:1100] Intake/Output this shift: No intake/output data recorded.  General appearance: alert, cooperative and no distress Heart: regular rate and rhythm Lungs: mildly dim i right base Abdomen: benign Extremities: no calf tenderness Wound: healing well  Lab Results:  Recent Labs  09/09/16 0256  WBC 9.7  HGB 8.9*  HCT 27.8*  PLT 474*   BMET:  Recent Labs  09/09/16 0256 09/11/16 0250  NA 139 140  K 4.2 4.0  CL 104 102  CO2 29 29  GLUCOSE 133* 115*  BUN 15 16  CREATININE 0.83 0.88  CALCIUM 8.0* 8.5*    PT/INR: No results for input(s): LABPROT, INR in the last 72 hours. ABG    Component Value Date/Time   PHART 7.429 09/07/2016 0344   HCO3 28.6 (H) 09/07/2016 0344   O2SAT 93.9 09/07/2016 0344   CBG (last 3)   Recent Labs  09/08/16 1231 09/08/16 1700 09/08/16 2217  GLUCAP 85 120* 114*    Meds Scheduled Meds: . acetaminophen  1,000 mg Oral Q6H   Or  . acetaminophen (TYLENOL) oral liquid 160 mg/5 mL  1,000 mg Oral Q6H  . balsalazide  750 mg Oral BID  . bisacodyl  10 mg Oral Daily  . cefTRIAXone  (ROCEPHIN)  IV  1 g Intravenous Q24H  . Chlorhexidine Gluconate Cloth  6 each Topical Daily  . enoxaparin (LOVENOX) injection  40 mg Subcutaneous Q24H  . fentaNYL   Intravenous Q4H  . ferrous sulfate  325 mg Oral Q breakfast  . pantoprazole  40 mg Oral Daily  . senna-docusate  1 tablet Oral QHS  . simvastatin  20 mg Oral QPC supper  . sodium chloride flush  10-40 mL Intracatheter Q12H  . vancomycin  750 mg Intravenous Q8H   Continuous Infusions: . dextrose 5 % and 0.9 % NaCl with KCl 20 mEq/L 10 mL/hr at 09/10/16 0700   PRN Meds:.diphenhydrAMINE **OR** diphenhydrAMINE, guaiFENesin, ketorolac, levalbuterol, naloxone **AND** sodium chloride flush, ondansetron (ZOFRAN) IV, ondansetron (ZOFRAN) IV, oxyCODONE, potassium chloride (KCL MULTIRUN) 30 mEq in 265 mL IVPB, sodium chloride flush, traMADol  Xrays Dg Chest 1v Repeat Same Day  Result Date: 09/10/2016 CLINICAL DATA:  Empyema, status post chest tube removal EXAM: CHEST - 1 VIEW SAME DAY COMPARISON:  Same day CXR from 0714 hours FINDINGS: Heart is normal in size. No aortic aneurysm. Low lung volumes bilaterally. No pneumothorax status post left-sided chest tube removal. Reduction of left basilar empyema with some residual atelectasis. Right lung remains clear. IMPRESSION: Removal of additional left basilar chest tube since prior  comparison. No pneumothorax. Low volumes with left basilar atelectasis. No reaccumulation of pleural fluid. Electronically Signed   By: Ashley Royalty M.D.   On: 09/10/2016 19:45   Dg Chest Port 1 View  Result Date: 09/10/2016 CLINICAL DATA:  Followup left-sided chest tube. EXAM: PORTABLE CHEST 1 VIEW COMPARISON:  Chest x-ray 09/09/2016 and chest CT 09/04/2016 FINDINGS: The heart is within normal limits in size and stable. Mild tortuosity of the thoracic aorta. The right IJ catheter has been removed. The more basilar pleural drainage catheter has been removed. The other catheter is still in place. Small residual pleural fluid  or pleural thickening and edema and atelectasis in the left lung. The right lung remains clear. No pneumothorax. IMPRESSION: 1. Removal of right IJ central venous catheter and removal of the left-sided chest tubes. 2. Persistent small pleural effusion versus pleural thickening and persistent edema and atelectasis in the left lung. Electronically Signed   By: Marijo Sanes M.D.   On: 09/10/2016 07:59    Results for orders placed or performed during the hospital encounter of 09/02/16  MRSA PCR Screening     Status: None   Collection Time: 09/02/16  7:01 PM  Result Value Ref Range Status   MRSA by PCR NEGATIVE NEGATIVE Final    Comment:        The GeneXpert MRSA Assay (FDA approved for NASAL specimens only), is one component of a comprehensive MRSA colonization surveillance program. It is not intended to diagnose MRSA infection nor to guide or monitor treatment for MRSA infections.   Culture, blood (routine x 2)     Status: None   Collection Time: 09/04/16  9:46 AM  Result Value Ref Range Status   Specimen Description BLOOD RIGHT HAND  Final   Special Requests IN PEDIATRIC BOTTLE 3ML  Final   Culture NO GROWTH 5 DAYS  Final   Report Status 09/09/2016 FINAL  Final  Culture, blood (routine x 2)     Status: None   Collection Time: 09/04/16  9:49 AM  Result Value Ref Range Status   Specimen Description BLOOD LEFT HAND  Final   Special Requests IN PEDIATRIC BOTTLE 3ML  Final   Culture NO GROWTH 5 DAYS  Final   Report Status 09/09/2016 FINAL  Final  Respiratory Panel by PCR     Status: None   Collection Time: 09/04/16 11:29 AM  Result Value Ref Range Status   Adenovirus NOT DETECTED NOT DETECTED Final   Coronavirus 229E NOT DETECTED NOT DETECTED Final   Coronavirus HKU1 NOT DETECTED NOT DETECTED Final   Coronavirus NL63 NOT DETECTED NOT DETECTED Final   Coronavirus OC43 NOT DETECTED NOT DETECTED Final   Metapneumovirus NOT DETECTED NOT DETECTED Final   Rhinovirus / Enterovirus NOT  DETECTED NOT DETECTED Final   Influenza A NOT DETECTED NOT DETECTED Final   Influenza B NOT DETECTED NOT DETECTED Final   Parainfluenza Virus 1 NOT DETECTED NOT DETECTED Final   Parainfluenza Virus 2 NOT DETECTED NOT DETECTED Final   Parainfluenza Virus 3 NOT DETECTED NOT DETECTED Final   Parainfluenza Virus 4 NOT DETECTED NOT DETECTED Final   Respiratory Syncytial Virus NOT DETECTED NOT DETECTED Final   Bordetella pertussis NOT DETECTED NOT DETECTED Final   Chlamydophila pneumoniae NOT DETECTED NOT DETECTED Final   Mycoplasma pneumoniae NOT DETECTED NOT DETECTED Final  Surgical pcr screen     Status: None   Collection Time: 09/05/16 11:32 PM  Result Value Ref Range Status   MRSA, PCR NEGATIVE  NEGATIVE Final   Staphylococcus aureus NEGATIVE NEGATIVE Final    Comment:        The Xpert SA Assay (FDA approved for NASAL specimens in patients over 36 years of age), is one component of a comprehensive surveillance program.  Test performance has been validated by [facility] for patients greater than or equal to 77 year old. It is not intended to diagnose infection nor to guide or monitor treatment.   Culture, respiratory (NON-Expectorated)     Status: None   Collection Time: 09/06/16  3:08 PM  Result Value Ref Range Status   Specimen Description PLEURAL WASHINGS LEFT  Final   Special Requests NONE  Final   Gram Stain   Final    RARE WBC PRESENT, PREDOMINANTLY PMN NO ORGANISMS SEEN    Culture NO GROWTH 2 DAYS  Final   Report Status 09/09/2016 FINAL  Final  Fungus Culture With Stain     Status: None (Preliminary result)   Collection Time: 09/06/16  4:02 PM  Result Value Ref Range Status   Fungus Stain Final report  Final    Comment: (NOTE) Performed At: [facility] [place], [place] [id number] Lindon Romp MD [id number]    Fungus (Mycology) Culture PENDING  Incomplete   Fungal Source PLEURAL  Final    Comment: WASHINGS LEFT   Culture,  respiratory (NON-Expectorated)     Status: None   Collection Time: 09/06/16  4:02 PM  Result Value Ref Range Status   Specimen Description BRONCHIAL WASHINGS  Final   Special Requests NONE  Final   Gram Stain   Final    FEW WBC PRESENT, PREDOMINANTLY PMN RARE GRAM POSITIVE COCCI IN PAIRS    Culture Consistent with normal respiratory flora.  Final   Report Status 09/09/2016 FINAL  Final  Acid Fast Smear (AFB)     Status: None   Collection Time: 09/06/16  4:02 PM  Result Value Ref Range Status   AFB Specimen Processing Concentration  Final   Acid Fast Smear Negative  Final    Comment: (NOTE) Performed At: [facility] [place], [place] [id number] Lindon Romp MD [id number]    Source (AFB) PLEURAL  Final    Comment: WASHINGS LEFT   Fungus Culture Result     Status: None   Collection Time: 09/06/16  4:02 PM  Result Value Ref Range Status   Result 1 Comment  Final    Comment: (NOTE) KOH/Calcofluor preparation:  no fungus observed. Performed At: [facility] [place], [place] [id number] Lindon Romp MD [id number]   Fungus Culture With Stain     Status: None (Preliminary result)   Collection Time: 09/06/16  4:13 PM  Result Value Ref Range Status   Fungus Stain Final report  Final    Comment: (NOTE) Performed At: [facility] [place], [place] [id number] Lindon Romp MD [id number]    Fungus (Mycology) Culture PENDING  Incomplete   Fungal Source TISSUE  Final    Comment: LEFT PLEURAL   Aerobic Culture (superficial specimen)     Status: None   Collection Time: 09/06/16  4:13 PM  Result Value Ref Range Status   Specimen Description TISSUE LEFT PLEURAL  Final   Special Requests NONE  Final   Gram Stain   Final    RARE WBC PRESENT, PREDOMINANTLY PMN NO ORGANISMS SEEN    Culture NO GROWTH 3 DAYS  Final   Report Status  09/09/2016 FINAL  Final  Acid Fast Smear (AFB)     Status: None    Collection Time: 09/06/16  4:13 PM  Result Value Ref Range Status   AFB Specimen Processing Concentration  Final   Acid Fast Smear Negative  Final    Comment: (NOTE) Performed At: [facility] [place], [place] [id number] Lindon Romp MD [id number]    Source (AFB) TISSUE  Final    Comment: LEFT PLEURAL   Fungus Culture Result     Status: None   Collection Time: 09/06/16  4:13 PM  Result Value Ref Range Status   Result 1 Comment  Final    Comment: (NOTE) KOH/Calcofluor preparation:  no fungus observed. Performed At: [facility] [place], [place] [id number] Lindon Romp MD [id number]   Fungus Culture With Stain     Status: None (Preliminary result)   Collection Time: 09/06/16  4:49 PM  Result Value Ref Range Status   Fungus Stain Final report  Final    Comment: (NOTE) Performed At: [facility] [place], [place] [id number] Lindon Romp MD [id number]    Fungus (Mycology) Culture PENDING  Incomplete   Fungal Source TISSUE  Final    Comment: LEFT PLEURAL   Aerobic Culture (superficial specimen)     Status: None   Collection Time: 09/06/16  4:49 PM  Result Value Ref Range Status   Specimen Description TISSUE LEFT PLEURAL  Final   Special Requests NONE  Final   Gram Stain   Final    MODERATE WBC PRESENT, PREDOMINANTLY PMN RARE GRAM POSITIVE COCCI IN PAIRS    Culture NO GROWTH 3 DAYS  Final   Report Status 09/09/2016 FINAL  Final  Acid Fast Smear (AFB)     Status: None   Collection Time: 09/06/16  4:49 PM  Result Value Ref Range Status   AFB Specimen Processing Concentration  Final   Acid Fast Smear Negative  Final    Comment: (NOTE) Performed At: [facility] [place], [place] [id number] Lindon Romp MD [id number]    Source (AFB) TISSUE  Final    Comment: LEFT PLEURAL   Fungus Culture Result     Status: None   Collection Time: 09/06/16  4:49 PM    Result Value Ref Range Status   Result 1 Comment  Final    Comment: (NOTE) KOH/Calcofluor preparation:  no fungus observed. Performed At: [facility] [place], [place] [id number] Lindon Romp MD [id number]     Assessment/Plan: S/P Procedure(s) (LRB): VIDEO ASSISTED THORACOSCOPY (VATS)/DRAIN EMPYEMA (Left) DECORTICATION (Left) VIDEO BRONCHOSCOPY (N/A)  1 conts to progree 2 all tubes out- d/c pca today 3 conts vanco ,possible  transition to po abx soon for gr+ cocci, ceftriaxone was added yesterday    LOS: 9 days    GOLD,WAYNE E 09/11/2016

## 2016-09-11 NOTE — Consult Note (Signed)
[facility] for Infectious Disease       Reason for Consult: empyema    Referring Physician: Dr. Roxan Hockey  Principal Problem:   Community acquired pneumonia of left lower lobe of lung ([place]) Active Problems:   Prostate cancer ([place])   Ulcerative colitis, unspecified, without complications ([place])   Pleural effusion, left   Normocytic anemia   Opacity of lung on imaging study   Loculated pleural effusion   Hypoxemia   Empyema, left ([place])   . acetaminophen  1,000 mg Oral Q6H   Or  . acetaminophen (TYLENOL) oral liquid 160 mg/5 mL  1,000 mg Oral Q6H  . balsalazide  750 mg Oral BID  . bisacodyl  10 mg Oral Daily  . cefTRIAXone (ROCEPHIN)  IV  1 g Intravenous Q24H  . Chlorhexidine Gluconate Cloth  6 each Topical Daily  . enoxaparin (LOVENOX) injection  40 mg Subcutaneous Q24H  . ferrous sulfate  325 mg Oral Q breakfast  . pantoprazole  40 mg Oral Daily  . senna-docusate  1 tablet Oral QHS  . simvastatin  20 mg Oral QPC supper  . sodium chloride flush  10-40 mL Intracatheter Q12H  . vancomycin  750 mg Intravenous Q8H    Recommendations: D/c vancomycin Continue ceftriaxone Can discharge on augmentin 875 mg bid for 21 days  Will check routine HCV Ab  Assessment: He has recently community acquired pneumonia complicated by empyema requiring VATS on 3/1 and cultures with NGTD but two with Gram positive cocci in pairs.  This is most consistent with Streptococcal organism.  MRSA always possible post influenza but no evidence of that so will not require MRSA coverage.   HIV negative  Antibiotics: Vancomycin and ceftriaxone Previous pip/tazo, azithromycin  HPI: [name] is a 61 y.o. male with prostate cancer, who initially presented 2/25 with persistent cough following a viral infection, some malaise and then developed left flank and rib pain.  CT c/w parapneumonic effusion and required VATS as above.  Cultures without any growth but gram stain noted.  Has been on  vancomycin since admission, ceftriaxone since admission minus 1 day.  No current fever, no chills, pain controlled and off PCA.  AFB smears negative.   CT chest independently reviewed and I do see the effusion and CXR reviewed today and some residual effusion on left.   Review of Systems:  Constitutional: negative for fevers, chills and anorexia Respiratory: negative for hemoptysis Integument/breast: negative for rash Musculoskeletal: negative for myalgias and arthralgias All other systems reviewed and are negative    Past Medical History:  Diagnosis Date  . Allergy   . Anxiety   . Arthritis   . Hyperlipidemia   . 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  Marland Kitchen Neoplasm of face    benign  . Prostate cancer ([place])   . Sleep apnea   . Ulcerative colitis     Social History  Substance Use Topics  . Smoking status: Former Research scientist (life sciences)  . Smokeless tobacco: Never Used  . Alcohol use 0.0 oz/week    Family History  Problem Relation Age of Onset  . Heart attack Father   . Hyperlipidemia    . Diabetes    . Thyroid disease    . Lung cancer    . Breast cancer    . Colon cancer    . Juvenile idiopathic arthritis Daughter   . Raynaud syndrome Daughter     Allergies  Allergen Reactions  . Sulfamethoxazole  REACTION: unspecified    Physical Exam: Constitutional: in no apparent distress and alert  Vitals:   09/11/16 0444 09/11/16 0836  BP: 122/67 109/79  Pulse: 75 65  Resp: 17 14  Temp: 98 F (36.7 C) 98.6 F (37 C)   EYES: anicteric ENMT: no thrush Cardiovascular: Cor RRR Respiratory: CTA B; normal respiratory effort, no chest tubes GI: Bowel sounds are normal, liver is not enlarged, spleen is not enlarged Musculoskeletal: no pedal edema noted Skin: negatives: no rash   Lab Results  Component Value Date   WBC 9.7 09/09/2016   HGB 8.9 (L) 09/09/2016   HCT 27.8 (L) 09/09/2016   MCV 94.2 09/09/2016   PLT 474 (H) 09/09/2016    Lab Results  Component Value Date     CREATININE 0.88 09/11/2016   BUN 16 09/11/2016   NA 140 09/11/2016   K 4.0 09/11/2016   CL 102 09/11/2016   CO2 29 09/11/2016    Lab Results  Component Value Date   ALT 73 (H) 09/08/2016   AST 45 (H) 09/08/2016   ALKPHOS 189 (H) 09/08/2016     Microbiology: Recent Results (from the past 240 hour(s))  MRSA PCR Screening     Status: None   Collection Time: 09/02/16  7:01 PM  Result Value Ref Range Status   MRSA by PCR NEGATIVE NEGATIVE Final    Comment:        The GeneXpert MRSA Assay (FDA approved for NASAL specimens only), is one component of a comprehensive MRSA colonization surveillance program. It is not intended to diagnose MRSA infection nor to guide or monitor treatment for MRSA infections.   Culture, blood (routine x 2)     Status: None   Collection Time: 09/04/16  9:46 AM  Result Value Ref Range Status   Specimen Description BLOOD RIGHT HAND  Final   Special Requests IN PEDIATRIC BOTTLE 3ML  Final   Culture NO GROWTH 5 DAYS  Final   Report Status 09/09/2016 FINAL  Final  Culture, blood (routine x 2)     Status: None   Collection Time: 09/04/16  9:49 AM  Result Value Ref Range Status   Specimen Description BLOOD LEFT HAND  Final   Special Requests IN PEDIATRIC BOTTLE 3ML  Final   Culture NO GROWTH 5 DAYS  Final   Report Status 09/09/2016 FINAL  Final  Respiratory Panel by PCR     Status: None   Collection Time: 09/04/16 11:29 AM  Result Value Ref Range Status   Adenovirus NOT DETECTED NOT DETECTED Final   Coronavirus 229E NOT DETECTED NOT DETECTED Final   Coronavirus HKU1 NOT DETECTED NOT DETECTED Final   Coronavirus NL63 NOT DETECTED NOT DETECTED Final   Coronavirus OC43 NOT DETECTED NOT DETECTED Final   Metapneumovirus NOT DETECTED NOT DETECTED Final   Rhinovirus / Enterovirus NOT DETECTED NOT DETECTED Final   Influenza A NOT DETECTED NOT DETECTED Final   Influenza B NOT DETECTED NOT DETECTED Final   Parainfluenza Virus 1 NOT DETECTED NOT DETECTED  Final   Parainfluenza Virus 2 NOT DETECTED NOT DETECTED Final   Parainfluenza Virus 3 NOT DETECTED NOT DETECTED Final   Parainfluenza Virus 4 NOT DETECTED NOT DETECTED Final   Respiratory Syncytial Virus NOT DETECTED NOT DETECTED Final   Bordetella pertussis NOT DETECTED NOT DETECTED Final   Chlamydophila pneumoniae NOT DETECTED NOT DETECTED Final   Mycoplasma pneumoniae NOT DETECTED NOT DETECTED Final  Surgical pcr screen     Status: None  Collection Time: 09/05/16 11:32 PM  Result Value Ref Range Status   MRSA, PCR NEGATIVE NEGATIVE Final   Staphylococcus aureus NEGATIVE NEGATIVE Final    Comment:        The Xpert SA Assay (FDA approved for NASAL specimens in patients over 87 years of age), is one component of a comprehensive surveillance program.  Test performance has been validated by [facility] for patients greater than or equal to 53 year old. It is not intended to diagnose infection nor to guide or monitor treatment.   Culture, respiratory (NON-Expectorated)     Status: None   Collection Time: 09/06/16  3:08 PM  Result Value Ref Range Status   Specimen Description PLEURAL WASHINGS LEFT  Final   Special Requests NONE  Final   Gram Stain   Final    RARE WBC PRESENT, PREDOMINANTLY PMN NO ORGANISMS SEEN    Culture NO GROWTH 2 DAYS  Final   Report Status 09/09/2016 FINAL  Final  Fungus Culture With Stain     Status: None (Preliminary result)   Collection Time: 09/06/16  4:02 PM  Result Value Ref Range Status   Fungus Stain Final report  Final    Comment: (NOTE) Performed At: [facility] [place], [place] [id number] Lindon Romp MD [id number]    Fungus (Mycology) Culture PENDING  Incomplete   Fungal Source PLEURAL  Final    Comment: WASHINGS LEFT   Culture, respiratory (NON-Expectorated)     Status: None   Collection Time: 09/06/16  4:02 PM  Result Value Ref Range Status   Specimen Description BRONCHIAL WASHINGS  Final   Special  Requests NONE  Final   Gram Stain   Final    FEW WBC PRESENT, PREDOMINANTLY PMN RARE GRAM POSITIVE COCCI IN PAIRS    Culture Consistent with normal respiratory flora.  Final   Report Status 09/09/2016 FINAL  Final  Acid Fast Smear (AFB)     Status: None   Collection Time: 09/06/16  4:02 PM  Result Value Ref Range Status   AFB Specimen Processing Concentration  Final   Acid Fast Smear Negative  Final    Comment: (NOTE) Performed At: [facility] [place], [place] [id number] Lindon Romp MD [id number]    Source (AFB) PLEURAL  Final    Comment: WASHINGS LEFT   Fungus Culture Result     Status: None   Collection Time: 09/06/16  4:02 PM  Result Value Ref Range Status   Result 1 Comment  Final    Comment: (NOTE) KOH/Calcofluor preparation:  no fungus observed. Performed At: [facility] [place], [place] [id number] Lindon Romp MD [id number]   Fungus Culture With Stain     Status: None (Preliminary result)   Collection Time: 09/06/16  4:13 PM  Result Value Ref Range Status   Fungus Stain Final report  Final    Comment: (NOTE) Performed At: [facility] [place], [place] [id number] Lindon Romp MD [id number]    Fungus (Mycology) Culture PENDING  Incomplete   Fungal Source TISSUE  Final    Comment: LEFT PLEURAL   Aerobic Culture (superficial specimen)     Status: None   Collection Time: 09/06/16  4:13 PM  Result Value Ref Range Status   Specimen Description TISSUE LEFT PLEURAL  Final   Special Requests NONE  Final   Gram Stain   Final    RARE WBC PRESENT, PREDOMINANTLY PMN NO  ORGANISMS SEEN    Culture NO GROWTH 3 DAYS  Final   Report Status 09/09/2016 FINAL  Final  Acid Fast Smear (AFB)     Status: None   Collection Time: 09/06/16  4:13 PM  Result Value Ref Range Status   AFB Specimen Processing Concentration  Final   Acid Fast Smear Negative  Final    Comment:  (NOTE) Performed At: [facility] [place], [place] [id number] Lindon Romp MD [id number]    Source (AFB) TISSUE  Final    Comment: LEFT PLEURAL   Fungus Culture Result     Status: None   Collection Time: 09/06/16  4:13 PM  Result Value Ref Range Status   Result 1 Comment  Final    Comment: (NOTE) KOH/Calcofluor preparation:  no fungus observed. Performed At: [facility] [place], [place] [id number] Lindon Romp MD [id number]   Fungus Culture With Stain     Status: None (Preliminary result)   Collection Time: 09/06/16  4:49 PM  Result Value Ref Range Status   Fungus Stain Final report  Final    Comment: (NOTE) Performed At: [facility] [place], [place] [id number] Lindon Romp MD [id number]    Fungus (Mycology) Culture PENDING  Incomplete   Fungal Source TISSUE  Final    Comment: LEFT PLEURAL   Aerobic Culture (superficial specimen)     Status: None   Collection Time: 09/06/16  4:49 PM  Result Value Ref Range Status   Specimen Description TISSUE LEFT PLEURAL  Final   Special Requests NONE  Final   Gram Stain   Final    MODERATE WBC PRESENT, PREDOMINANTLY PMN RARE GRAM POSITIVE COCCI IN PAIRS    Culture NO GROWTH 3 DAYS  Final   Report Status 09/09/2016 FINAL  Final  Acid Fast Smear (AFB)     Status: None   Collection Time: 09/06/16  4:49 PM  Result Value Ref Range Status   AFB Specimen Processing Concentration  Final   Acid Fast Smear Negative  Final    Comment: (NOTE) Performed At: [facility] [place], [place] [id number] Lindon Romp MD [id number]    Source (AFB) TISSUE  Final    Comment: LEFT PLEURAL   Fungus Culture Result     Status: None   Collection Time: 09/06/16  4:49 PM  Result Value Ref Range Status   Result 1 Comment  Final    Comment: (NOTE) KOH/Calcofluor preparation:  no fungus observed. Performed At: [facility] [place], [place] [id number] Lindon Romp MD [id number]     Scharlene Gloss, [place] [facility] for Infectious Disease [facility] [email or web address] [phone] pager  [phone] cell 09/11/2016, 9:43 AM

## 2016-09-11 NOTE — Progress Notes (Signed)
Fentanyl PCA discontinued. Wasted 3 mL with Lester Kinsman at 0900.

## 2016-09-11 NOTE — Care Management Note (Signed)
Case Management Note  Patient Details  Name: [name] MRN: [id number] Date of Birth: January 29, 1956  Subjective/Objective:    Met with pt and son @ bedside.  PT/OT had earlier recommended home therapy but pt states he does not need therapy and declines referral.  CM advised him that his PCP could order services if he changed his mind after returning home.  Son states he will stay with pt and assist him as long as he is needed.                        Expected Discharge Plan:  [place]  Discharge planning Services  CM Consult  Status of Service:  Completed, signed off  Girard Cooter, [place] 09/11/2016, 12:43 PM

## 2016-09-11 NOTE — Progress Notes (Signed)
Patient and patient's son [name] given discharge instructions. Patient given appt for suture removal. Sutures clean dry and intact at this time. Patient educated on new/changes to medication. Patient and son verbalize understanding. Patient being discharged home with son.

## 2016-09-13 ENCOUNTER — Telehealth: Payer: Self-pay | Admitting: Surgical

## 2016-09-13 NOTE — Telephone Encounter (Signed)
      [address]       [place] [address]             [phone]     Attempted telephone call to check on patient. Voice mail answer, did not leave a message at this time.  GOLD,WAYNE E, PA-C

## 2016-09-17 ENCOUNTER — Ambulatory Visit (INDEPENDENT_AMBULATORY_CARE_PROVIDER_SITE_OTHER): Payer: Self-pay

## 2016-09-17 DIAGNOSIS — J869 Pyothorax without fistula: Secondary | ICD-10-CM

## 2016-09-17 DIAGNOSIS — Z4802 Encounter for removal of sutures: Secondary | ICD-10-CM

## 2016-09-17 NOTE — Progress Notes (Signed)
Removed 3 sutures from chest tube sites with no signs of infection and patient tolerated well. 

## 2016-09-24 ENCOUNTER — Other Ambulatory Visit: Payer: Self-pay | Admitting: Thoracic Surgery (Cardiothoracic Vascular Surgery)

## 2016-09-24 DIAGNOSIS — J90 Pleural effusion, not elsewhere classified: Secondary | ICD-10-CM

## 2016-09-25 ENCOUNTER — Encounter: Payer: Self-pay | Admitting: Thoracic Surgery (Cardiothoracic Vascular Surgery)

## 2016-09-25 ENCOUNTER — Ambulatory Visit (INDEPENDENT_AMBULATORY_CARE_PROVIDER_SITE_OTHER): Payer: Self-pay | Admitting: Thoracic Surgery (Cardiothoracic Vascular Surgery)

## 2016-09-25 ENCOUNTER — Ambulatory Visit
Admission: RE | Admit: 2016-09-25 | Discharge: 2016-09-25 | Disposition: A | Discharge status: Home / Self Care | Payer: BLUE CROSS/BLUE SHIELD | Source: Ambulatory Visit | Attending: Thoracic Surgery (Cardiothoracic Vascular Surgery) | Admitting: Thoracic Surgery (Cardiothoracic Vascular Surgery)

## 2016-09-25 VITALS — BP 107/73 | HR 74 | Resp 16 | Ht 66.0 in | Wt 176.0 lb

## 2016-09-25 DIAGNOSIS — J869 Pyothorax without fistula: Secondary | ICD-10-CM

## 2016-09-25 DIAGNOSIS — J90 Pleural effusion, not elsewhere classified: Secondary | ICD-10-CM

## 2016-09-25 DIAGNOSIS — Z09 Encounter for follow-up examination after completed treatment for conditions other than malignant neoplasm: Secondary | ICD-10-CM

## 2016-09-25 NOTE — Progress Notes (Signed)
[address]       ,[place] [address]             [phone]    HPI: Mr. Mizer returns for a scheduled postoperative follow-up visit  He is a 61 year old man with a history of ulcerative colitis, sleep apnea, and metastatic prostate cancer to a left rib. He was admitted on 09/02/2016 with shortness of breath. He had the flu with a persistent cough for a couple of [place]. He presented to emergency room on 2/24 with left-sided rib pain. A CT showed no evidence of pulmonary embolus. There was a very small left effusion and infiltrate. He was started on Levaquin, but presented back following day with shortness of breath. He had developed a much larger effusion. He also had some elevation of the left hemidiaphragm.  I did a left VATS to drain the empyema and visceral and parietal pleural decortication on 09/06/2016. He did well postoperatively and went home on postoperative day #5. He was discharged on Augmentin. Cultures grew strep. There was no evidence of malignancy on his pathology.  Since discharge he's been doing well. He is no longer taking oxycodone. He does complain of shortness of breath particularly when he tries to walk and talk at the same time. If he doesn't talk he can up to 2 miles at a time. He has not had any fevers or chills.   Past Medical History:  Diagnosis Date  . Allergy   . Anxiety   . Arthritis   . Hyperlipidemia   . Metastatic cancer to bone ([employer])    left rib.  Marland Kitchen Neoplasm of face    benign  . Prostate cancer ([place])   . Sleep apnea   . Ulcerative colitis      Current Outpatient Prescriptions  Medication Sig Dispense Refill  . amoxicillin-clavulanate (AUGMENTIN) 875-125 MG tablet Take 1 tablet by mouth 2 (two) times daily. 42 tablet 0  . balsalazide (COLAZAL) 750 MG capsule Take 750 mg by mouth 2 (two) times daily.     . Cholecalciferol (VITAMIN D) 2000 UNITS tablet Take 4,000 Units by mouth daily.    . Cyanocobalamin (VITAMIN B 12 PO)  Take by mouth.    . ferrous sulfate 325 (65 FE) MG tablet Take 325 mg by mouth daily with breakfast.    . fish oil-omega-3 fatty acids 1000 MG capsule Take 2 g by mouth daily.      Marland Kitchen glucosamine-chondroitin 500-400 MG tablet Take 1 tablet by mouth 3 (three) times daily.      Marland Kitchen guaiFENesin (MUCINEX) 600 MG 12 hr tablet Take 2 tablets (1,200 mg total) by mouth 2 (two) times daily as needed for to loosen phlegm. 60 tablet 0  . Lycopene 10 MG CAPS Take 10 mg by mouth.    . Multiple Vitamin (MULTIVITAMIN) tablet Take 1 tablet by mouth daily.      . simvastatin (ZOCOR) 20 MG tablet Take 20 mg by mouth daily after supper.  1  . valACYclovir (VALTREX) 500 MG tablet Take 500 mg by mouth 2 (two) times daily. PRN     No current facility-administered medications for this visit.     Physical Exam BP 107/73   Pulse 74   Resp 16   Ht 5\' 6"  (1.676 m)   Wt 176 lb (79.8 kg)   SpO2 98% Comment: RA  BMI 28.42 kg/m  61 year old man in no acute distress Alert and oriented 3 with no focal deficits  Lungs diminished breath sounds at left base, otherwise clear Incisions healing well  Diagnostic Tests: CHEST  2 VIEW  COMPARISON:  Postoperative PA and lateral chest radiographs 09/11/2016. Preoperative chest CT 09/04/2016, and earlier.  FINDINGS: Since the preoperative radiographs on 09/11/2016 left lung volume and left lung base ventilation has improved. However, there is an 8 cm posteriorly situated and mildly lobulated left pleural based mass which is probably residual or recurrent loculated pleural fluid. Mediastinal contours are stable. No pneumothorax or pulmonary edema. The right lung is clear. No acute osseous abnormality identified. Negative visible bowel gas pattern.  IMPRESSION: 1. Improved left lung volume in left lung base ventilation since 09/11/2016. However, there is recurrent or residual 8 cm loculated posterior left pleural effusion. 2. The right lung remains  clear.   Electronically Signed   By: Genevie Ann M.D.   On: 09/25/2016 11:43 I personally reviewed the chest x-ray currently findings noted above. There is still elevation of left hemidiaphragm although it does appear improved from the last film prior to discharge. His loculated effusion was there prior to discharge.  Impression: 61 year old gentleman who underwent a left VATS for drainage of an empyema and decortication on 09/06/2016. He is now about 3 Fol[place] out from surgery. Overall he is doing very well. He will be completing his Augmentin in about a week.  There are 2 issues currently. The first is a small loculated posterior lateral fusion superiorly on the left side. This is relatively small and would be difficult to access. I think we should observe that for now as he is making good progress otherwise.  Second issue is his elevated left hemidiaphragm. This was not present on a chest x-ray on 08/28/2013. But by the time he had a CT on 226 was prominent. I suspect this is phrenic nerve dysfunction due to the severe inflammation in the pleural space. It does appear to be a little better on his chest x-ray today compared to the film prior to discharge. That probably accounts for his difficulty walking and talking at the same time. It may take several [place] to months to completely resolved.  Plan: Return in 3 [place] with PA and lateral chest x-ray to follow-up loculated effusion.  [name], MD [facility] [phone]

## 2016-10-04 LAB — FUNGUS CULTURE RESULT

## 2016-10-04 LAB — FUNGUS CULTURE WITH STAIN

## 2016-10-04 LAB — FUNGAL ORGANISM REFLEX

## 2016-10-12 ENCOUNTER — Other Ambulatory Visit: Payer: Self-pay | Admitting: Thoracic Surgery (Cardiothoracic Vascular Surgery)

## 2016-10-12 DIAGNOSIS — J869 Pyothorax without fistula: Secondary | ICD-10-CM

## 2016-10-16 ENCOUNTER — Encounter: Payer: Self-pay | Admitting: Thoracic Surgery (Cardiothoracic Vascular Surgery)

## 2016-10-16 ENCOUNTER — Ambulatory Visit
Admission: RE | Admit: 2016-10-16 | Discharge: 2016-10-16 | Disposition: A | Discharge status: Home / Self Care | Payer: BLUE CROSS/BLUE SHIELD | Source: Ambulatory Visit | Attending: Thoracic Surgery (Cardiothoracic Vascular Surgery) | Admitting: Thoracic Surgery (Cardiothoracic Vascular Surgery)

## 2016-10-16 ENCOUNTER — Ambulatory Visit (INDEPENDENT_AMBULATORY_CARE_PROVIDER_SITE_OTHER): Payer: Self-pay | Admitting: Thoracic Surgery (Cardiothoracic Vascular Surgery)

## 2016-10-16 VITALS — BP 106/69 | HR 89 | Resp 16 | Ht 66.0 in | Wt 176.0 lb

## 2016-10-16 DIAGNOSIS — J869 Pyothorax without fistula: Secondary | ICD-10-CM

## 2016-10-16 DIAGNOSIS — J90 Pleural effusion, not elsewhere classified: Secondary | ICD-10-CM

## 2016-10-16 DIAGNOSIS — Z09 Encounter for follow-up examination after completed treatment for conditions other than malignant neoplasm: Secondary | ICD-10-CM

## 2016-10-16 NOTE — Progress Notes (Signed)
[address]       [place] [address]             [phone]    HPI: [name] returns for a scheduled follow-up visit.  He is a 61 year old man who had a left VATS to drain an empyema on 09/06/2016. He did well postoperatively and went home on day 5. He was treated with additional couple of [place] of antibiotics but has now completed his course of amoxicillin-clavulanate. I saw him in the office a couple of [place] ago and he was still complaining of shortness of breath when he would try to walk and talk the same time. His chest x-ray showed a small loculated posterior lateral fluid collection on the left side.  Since his last visit his breathing has improved slightly. He isstill not back to normal. He has noted some discomfort in the left back and chest. He denies fevers, chills, or sweats.  Past Medical History:  Diagnosis Date  . Allergy   . Anxiety   . Arthritis   . Hyperlipidemia   . 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  Marland Kitchen Neoplasm of face    benign  . Prostate cancer ([place])   . Sleep apnea   . Ulcerative colitis     Current Outpatient Prescriptions  Medication Sig Dispense Refill  . amoxicillin-clavulanate (AUGMENTIN) 875-125 MG tablet Take 1 tablet by mouth 2 (two) times daily. 42 tablet 0  . balsalazide (COLAZAL) 750 MG capsule Take 750 mg by mouth 2 (two) times daily.     . Cholecalciferol (VITAMIN D) 2000 UNITS tablet Take 4,000 Units by mouth daily.    . Cyanocobalamin (VITAMIN B 12 PO) Take by mouth.    . ferrous sulfate 325 (65 FE) MG tablet Take 325 mg by mouth daily with breakfast.    . fish oil-omega-3 fatty acids 1000 MG capsule Take 2 g by mouth daily.      Marland Kitchen glucosamine-chondroitin 500-400 MG tablet Take 1 tablet by mouth 3 (three) times daily.      Marland Kitchen guaiFENesin (MUCINEX) 600 MG 12 hr tablet Take 2 tablets (1,200 mg total) by mouth 2 (two) times daily as needed for to loosen phlegm. 60 tablet 0  . Lycopene 10 MG CAPS Take 10 mg by  mouth.    . Multiple Vitamin (MULTIVITAMIN) tablet Take 1 tablet by mouth daily.      . simvastatin (ZOCOR) 20 MG tablet Take 20 mg by mouth daily after supper.  1  . valACYclovir (VALTREX) 500 MG tablet Take 500 mg by mouth 2 (two) times daily. PRN     No current facility-administered medications for this visit.     Physical Exam BP 106/69 (BP Location: Right Arm, Patient Position: Sitting, Cuff Size: Large)   Pulse 89   Resp 16   Ht 5\' 6"  (1.676 m)   Wt 176 lb (79.8 kg)   SpO2 98% Comment: ON RA  BMI 38.73 kg/m  61-year-old man in no acute distress Alert and oriented 3 with no focal deficits Lungs diminished at left base, otherwise clear Incisions healing well  Diagnostic Tests: CHEST  2 VIEW  COMPARISON:  09/25/2016, 09/11/2017  FINDINGS: Similar low lung volumes with elevation of the left hemidiaphragm. Minor residual left basilar atelectasis versus scarring. Slight decrease in the posterior left chest pleural based opacity, compatible with residual loculated pleural fluid when compared to the prior studies. No new airspace consolidation, collapse or  edema. No enlarging effusion or pneumothorax. Trachea is midline. Minor atherosclerosis of the aorta. Bowel gas pattern is normal. Thoracic spondylosis diffusely.  IMPRESSION: Slightly smaller left posterior loculated pleural effusion compared to 09/25/2016.  Persistent low lung volumes with left basilar minor atelectasis and pleuroparenchymal scarring.  No superimposed acute process.   Electronically Signed   By: Jerilynn Mages.  Shick M.D.   On: 10/16/2016 13:38 I personally reviewed the chest x-ray compared to his previous film and concur with the findings noted above.  Impression: [name] is a 61 year old gentleman who is now a little over a month out from a left VATS to drain an empyema and decorticate the lung. Overall he is doing well.  The loculated fluid collection on the left is smaller compared to the  film on 09/25/2016. I reviewed the images with him. This would not be safe to attempt to aspirate in the office, but potentially be accessible with ultrasound-guided thoracentesis. He is not anxious to have that procedure done. Since the collection is smaller I don't think it is absolutely necessary to tap that.  He also had an elevated left hemidiaphragm noted on his preoperative CT chest. This was not present on his original chest x-ray at presentation, so I think is acutely related to the empyema and should resolve with time. The left diaphragm is still slightly elevated but does appear to have a little bit more curvature so there may be some recovery under way.  He knows to call if he starts experiencing more pain, has any worsening of his respiratory status or if he has fevers or chills or productive cough.  Plan: Return in 4 [place] with PA and lateral chest x-ray  [name], MD [facility] [phone]

## 2016-10-20 LAB — ACID FAST CULTURE WITH REFLEXED SENSITIVITIES
ACID FAST CULTURE - AFSCU3: NEGATIVE
ACID FAST CULTURE - AFSCU3: NEGATIVE

## 2016-10-20 LAB — ACID FAST CULTURE WITH REFLEXED SENSITIVITIES (MYCOBACTERIA): Acid Fast Culture: NEGATIVE

## 2016-11-07 ENCOUNTER — Ambulatory Visit
Admission: RE | Admit: 2016-11-07 | Discharge: 2016-11-07 | Disposition: A | Discharge status: Home / Self Care | Payer: BLUE CROSS/BLUE SHIELD | Source: Ambulatory Visit | Attending: Family Medicine | Admitting: Family Medicine

## 2016-11-07 ENCOUNTER — Other Ambulatory Visit: Payer: Self-pay | Admitting: Family Medicine

## 2016-11-07 DIAGNOSIS — M549 Dorsalgia, unspecified: Secondary | ICD-10-CM

## 2016-11-12 ENCOUNTER — Other Ambulatory Visit: Payer: Self-pay | Admitting: Thoracic Surgery (Cardiothoracic Vascular Surgery)

## 2016-11-12 DIAGNOSIS — J869 Pyothorax without fistula: Secondary | ICD-10-CM

## 2016-11-13 ENCOUNTER — Ambulatory Visit (INDEPENDENT_AMBULATORY_CARE_PROVIDER_SITE_OTHER): Payer: Self-pay | Admitting: Thoracic Surgery (Cardiothoracic Vascular Surgery)

## 2016-11-13 ENCOUNTER — Encounter: Payer: Self-pay | Admitting: Thoracic Surgery (Cardiothoracic Vascular Surgery)

## 2016-11-13 ENCOUNTER — Ambulatory Visit
Admission: RE | Admit: 2016-11-13 | Discharge: 2016-11-13 | Disposition: A | Discharge status: Home / Self Care | Payer: BLUE CROSS/BLUE SHIELD | Source: Ambulatory Visit | Attending: Thoracic Surgery (Cardiothoracic Vascular Surgery) | Admitting: Thoracic Surgery (Cardiothoracic Vascular Surgery)

## 2016-11-13 VITALS — BP 128/79 | HR 70 | Resp 20 | Ht 66.0 in | Wt 180.0 lb

## 2016-11-13 DIAGNOSIS — J869 Pyothorax without fistula: Secondary | ICD-10-CM

## 2016-11-13 DIAGNOSIS — Z09 Encounter for follow-up examination after completed treatment for conditions other than malignant neoplasm: Secondary | ICD-10-CM

## 2016-11-13 NOTE — Progress Notes (Signed)
[address]       [place], [address]             [phone]    HPI: Mr. Klingel returns for a scheduled postop follow up visit  He is a 61 year old man who had a left empyema. He underwent left VATS and drainage of the empyema and decortication on 09/06/2016. Postoperatively he did well Mohamed day 5. Saw him back in the office a few [place] later he still had a lot of shortness of breath. His most recent office visit was 10/17/2015. His chest x-ray showed some residual loculated fluid there still appeared to be slight elevation of left hemidiaphragm. He also was still having some discomfort in his back.  He feels better today. He has been back at work. He does not have much stamina. He has to rest every afternoon. He is not short of breath as he was previously. He has not had any fevers, chills, or sweats. He has not had the back discomfort in about a week. He does have some discomfort near his most anterior chest tube site.  Past Medical History:  Diagnosis Date  . Allergy   . Anxiety   . Arthritis   . Hyperlipidemia   . 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  Marland Kitchen Neoplasm of face    benign  . Prostate cancer ([place])   . Sleep apnea   . Ulcerative colitis      Current Outpatient Prescriptions  Medication Sig Dispense Refill  . balsalazide (COLAZAL) 750 MG capsule Take 750 mg by mouth 2 (two) times daily.     . Cholecalciferol (VITAMIN D) 2000 UNITS tablet Take 4,000 Units by mouth daily.    . Cyanocobalamin (VITAMIN B 12 PO) Take by mouth.    . ferrous sulfate 325 (65 FE) MG tablet Take 325 mg by mouth daily with breakfast.    . fish oil-omega-3 fatty acids 1000 MG capsule Take 2 g by mouth daily.      Marland Kitchen glucosamine-chondroitin 500-400 MG tablet Take 1 tablet by mouth 3 (three) times daily.      Marland Kitchen guaiFENesin (MUCINEX) 600 MG 12 hr tablet Take 2 tablets (1,200 mg total) by mouth 2 (two) times daily as needed for to loosen phlegm. 60 tablet 0  . Lycopene  10 MG CAPS Take 10 mg by mouth.    . Multiple Vitamin (MULTIVITAMIN) tablet Take 1 tablet by mouth daily.      . simvastatin (ZOCOR) 20 MG tablet Take 20 mg by mouth daily after supper.  1  . valACYclovir (VALTREX) 500 MG tablet Take 500 mg by mouth 2 (two) times daily. PRN     No current facility-administered medications for this visit.     Physical Exam BP 128/79   Pulse 70   Resp 20   Ht 5\' 6"  (1.676 m)   Wt 180 lb (81.6 kg)   SpO2 97%   BMI 29.52 kg/m  61 year old man in no acute distress Well-developed and well-nourished Alert and oriented 3 with no focal deficits Cardiac regular rate and rhythm Lungs clear with equal breath sounds bilaterally  Diagnostic Tests:  I personally reviewed the chest x-ray and compared to his previous films. The loculated effusion appears smaller and there appears to be improved excursion of the diaphragm.  Impression: Mr. Ruppe is a 61 year old gentleman who had pneumonia complicated by a massive empyema. I drained the empyema and decorticated the lung on  09/06/2016. He really has recovered extremely well, but has been a little frustrated that he was not able to get back to full speed sooner. Overall I think his course has been smoother then most after decortication.  He does have some pain around the anterior chest tube site. That is not unusual and should improve with time.  He had elevation of his left hemidiaphragm noted on the CT prior to his surgery. That appeared slightly better the last time and appears significantly better this time. I would expect that to pretty much fully recovered.  There is a small loculated effusion posteriorly, probably in the fissure. This appears to be getting smaller with time. It may never resolve completely but is unlikely cause him any problems.  He is back to work.  Plan: Return in 2 months with PA and lateral chest x-ray.  [name], MD [facility] [phone]

## 2016-12-11 ENCOUNTER — Encounter: Payer: Self-pay | Admitting: Neurology

## 2016-12-11 ENCOUNTER — Ambulatory Visit (INDEPENDENT_AMBULATORY_CARE_PROVIDER_SITE_OTHER): Payer: BLUE CROSS/BLUE SHIELD | Admitting: Neurology

## 2016-12-11 VITALS — BP 115/76 | HR 66 | Ht 66.0 in | Wt 177.0 lb

## 2016-12-11 DIAGNOSIS — J189 Pneumonia, unspecified organism: Secondary | ICD-10-CM

## 2016-12-11 DIAGNOSIS — G253 Myoclonus: Secondary | ICD-10-CM

## 2016-12-11 DIAGNOSIS — G4733 Obstructive sleep apnea (adult) (pediatric): Secondary | ICD-10-CM | POA: Diagnosis not present

## 2016-12-11 DIAGNOSIS — Z789 Other specified health status: Secondary | ICD-10-CM

## 2016-12-11 DIAGNOSIS — R400 Somnolence: Secondary | ICD-10-CM

## 2016-12-11 DIAGNOSIS — J181 Lobar pneumonia, unspecified organism: Secondary | ICD-10-CM

## 2016-12-11 DIAGNOSIS — C7951 Secondary malignant neoplasm of bone: Secondary | ICD-10-CM

## 2016-12-11 DIAGNOSIS — R351 Nocturia: Secondary | ICD-10-CM | POA: Diagnosis not present

## 2016-12-11 DIAGNOSIS — J869 Pyothorax without fistula: Secondary | ICD-10-CM

## 2016-12-11 DIAGNOSIS — C61 Malignant neoplasm of prostate: Secondary | ICD-10-CM

## 2016-12-11 NOTE — Progress Notes (Signed)
Subjective:    Patient ID: [name] is a 61 y.o. male.  HPI     Star Age, MD, PhD [facility] [address] [place], [place] [address]  Dear Dr. Sandi Mariscal,   I saw your patient, [name], upon your kind request in my neurologic clinic today for initial consultation of his sleep disorder, in particular, re-evaluation of his prior diagnosis of OSA. The patient is unaccompanied today. As you know, [name] is a 61 year old right-handed gentleman with an underlying medical history of metastatic prostate cancer diagnosed in 2015, recent hospitalization in February secondary to CAP of the left lower lung with pleural effusion and empyema, s/p surgical intervention on 09/06/16, history of ulcerative colitis, vitamin D deficiency, hyperlipidemia, hearing loss, and overweight state, who was previously diagnosed with obstructive sleep apnea several years ago and placed on CPAP therapy. Prior sleep study results were reviewed from 08/26/2006 (interpreted by Dr. Danton Sewer): AHI was 32/hour, O2 91% on average, nadir was 74%, a CPAP download is not available for my review today. He reports being about 50% compliant with his current CPAP, has had some discomfort with the interface, no telltale difference in his sleep quality when he uses his CPAP versus without. Epworth sleepiness score is 7 out of 24 today, fatigue score is 48 out of 63. He is separated and lives alone. He has a Industrial/product designer business. He has 3 children. He quit smoking in 1987, drinks alcohol about 3-4 times a week, denies illicit drug use, drinks caffeine in the form of coffee, 2 cups per day on average. I reviewed your office note from 10/29/2016 which you kindly included.  Last CXR on 11/13/16 showed:   IMPRESSION: 1. Continued improvement in the loculated left pleural effusion compared to 10/16/2016. 2. No new or acute cardiopulmonary process. He is followed by a CT surgeon.    Weight was reported to be 182 lb in 2008, he lost about 10 lb recently in the context of his recent illness and hospitalization.  His bedtime is between 9 and 10 PM, wakeup time between 5 and 6. He has nocturia usually once or twice per average night, denies morning headaches or telltale restless leg symptoms but has woken up with occasional leg jerking. He does not have any family history of obstructive sleep apnea. For his prostate cancer he is followed by Dr. Elza Rafter in [facility].  His Past Medical History Is Significant For: Past Medical History:  Diagnosis Date  . Allergy   . Anxiety   . Arthritis   . Hyperlipidemia   . 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  Marland Kitchen Neoplasm of face    benign  . Prostate cancer ([place])   . Sleep apnea   . Ulcerative colitis     His Past Surgical History Is Significant For: Past Surgical History:  Procedure Laterality Date  . DECORTICATION Left 09/06/2016   Procedure: DECORTICATION;  Surgeon: [name], MD;  Location: [place];  Service: Thoracic;  Laterality: Left;  . INGUINAL HERNIA REPAIR    . PROSTATECTOMY  04/2015  . SHOULDER ARTHROTOMY Right   . VIDEO ASSISTED THORACOSCOPY (VATS)/EMPYEMA Left 09/06/2016   Procedure: VIDEO ASSISTED THORACOSCOPY (VATS)/DRAIN EMPYEMA;  Surgeon: [name], MD;  Location: [place];  Service: Thoracic;  Laterality: Left;  Marland Kitchen VIDEO BRONCHOSCOPY N/A 09/06/2016   Procedure: VIDEO BRONCHOSCOPY;  Surgeon: [name], MD;  Location: [facility];  Service:  Thoracic;  Laterality: N/A;    His Family History Is Significant For: Family History  Problem Relation Age of Onset  . Heart attack Father   . Hyperlipidemia Unknown   . Diabetes Unknown   . Thyroid disease Unknown   . Lung cancer Unknown   . Breast cancer Unknown   . Colon cancer Unknown   . Juvenile idiopathic arthritis Daughter   . Raynaud syndrome Daughter     His Social History Is Significant For: Social History    Social History  . Marital status: Married    Spouse name: N/A  . Number of children: 3  . Years of education: N/A   Occupational History  . Flooring Contractor    Social History Main Topics  . Smoking status: Former Research scientist (life sciences)  . Smokeless tobacco: Never Used  . Alcohol use 0.0 oz/week  . Drug use: No  . Sexual activity: Not Asked   Other Topics Concern  . None   Social History Narrative   [facility] (09/04/16):   Currently he owns a flooring company. Questionable asbestos exposure. No mold exposure. Originally from [place]. Does have a dog but no bird exposure.    His Allergies Are:  Allergies  Allergen Reactions  . Sulfamethoxazole     REACTION: unspecified  :   His Current Medications Are:  Outpatient Encounter Prescriptions as of 12/11/2016  Medication Sig  . amitriptyline (ELAVIL) 10 MG tablet Take 10 mg by mouth at bedtime.  . balsalazide (COLAZAL) 750 MG capsule Take 750 mg by mouth 2 (two) times daily.   . Cholecalciferol (VITAMIN D) 2000 UNITS tablet Take 4,000 Units by mouth daily.  . fish oil-omega-3 fatty acids 1000 MG capsule Take 2 g by mouth daily.    Marland Kitchen glucosamine-chondroitin 500-400 MG tablet Take 1 tablet by mouth 3 (three) times daily.    . Lycopene 10 MG CAPS Take 10 mg by mouth.  . Multiple Vitamin (MULTIVITAMIN) tablet Take 1 tablet by mouth daily.    . simvastatin (ZOCOR) 20 MG tablet Take 20 mg by mouth daily after supper.  . valACYclovir (VALTREX) 500 MG tablet Take 500 mg by mouth 2 (two) times daily. PRN  . [DISCONTINUED] Cyanocobalamin (VITAMIN B 12 PO) Take by mouth.  . [DISCONTINUED] ferrous sulfate 325 (65 FE) MG tablet Take 325 mg by mouth daily with breakfast.  . [DISCONTINUED] guaiFENesin (MUCINEX) 600 MG 12 hr tablet Take 2 tablets (1,200 mg total) by mouth 2 (two) times daily as needed for to loosen phlegm.   No facility-administered encounter medications on file as of 12/11/2016.   :  Review of Systems:  Out of a  complete 14 point review of systems, all are reviewed and negative with the exception of these symptoms as listed below: Review of Systems  Neurological:       Pt presents today to discuss his sleep. Pt has been on cpap for many years. Pt reports that he is only about 50% compliant with the cpap.  Epworth Sleepiness Scale 0= would never doze 1= slight chance of dozing 2= moderate chance of dozing 3= high chance of dozing  Sitting and reading: 1 Watching TV: 1 Sitting inactive in a public place (ex. Theater or meeting): 0 As a passenger in a car for an hour without a break: 1 Lying down to rest in the afternoon: 3 Sitting and talking to someone: 0 Sitting quietly after lunch (no alcohol):1 In a car, while stopped in traffic: 0 Total: 7  Objective:  Neurologic Exam  Physical Exam Physical Examination:   Vitals:   12/11/16 1114  BP: 115/76  Pulse: 66   General Examination: The patient is a very pleasant 61 y.o. male in no acute distress. He appears well-developed and well-nourished and well groomed.   HEENT: Normocephalic, atraumatic, pupils are equal, round and reactive to light and accommodation. Funduscopic exam is normal with sharp disc margins noted. Extraocular tracking is good without limitation to gaze excursion or nystagmus noted. Normal smooth pursuit is noted. Hearing is mildly impaired. Face is symmetric with normal facial animation and normal facial sensation. Speech is clear with no dysarthria noted. There is no hypophonia. There is no lip, neck/head, jaw or voice tremor. Neck is supple with full range of passive and active motion. There are no carotid bruits on auscultation. Oropharynx exam reveals: mild mouth dryness, adequate dental hygiene and mild airway crowding, due to Smaller airway entry and redundant soft palate, tonsils are small or even absent, Mallampati is class II. Neck circumference is 15-5/8 inches.  Chest: Clear to auscultation without wheezing,  rhonchi or crackles noted.  Heart: S1+S2+0, regular and normal without murmurs, rubs or gallops noted.   Abdomen: Soft, non-tender and non-distended with normal bowel sounds appreciated on auscultation.  Extremities: There is no pitting edema in the distal lower extremities bilaterally. Pedal pulses are intact.  Skin: Warm and dry without trophic changes noted. There are no varicose veins.  Musculoskeletal: exam reveals no obvious joint deformities, tenderness or joint swelling or erythema.   Neurologically:  Mental status: The patient is awake, alert and oriented in all 4 spheres. His immediate and remote memory, attention, language skills and fund of knowledge are appropriate. There is no evidence of aphasia, agnosia, apraxia or anomia. Speech is clear with normal prosody and enunciation. Thought process is linear. Mood is normal and affect is normal.  Cranial nerves II - XII are as described above under HEENT exam. In addition: shoulder shrug is normal with equal shoulder height noted. Motor exam: Normal bulk, strength and tone is noted. There is no drift, tremor or rebound. Romberg is negative. Reflexes are 2+ throughout. Fine motor skills and coordination: intact with normal finger taps, normal hand movements, normal rapid alternating patting, normal foot taps and normal foot agility.  Cerebellar testing: No dysmetria or intention tremor on finger to nose testing. Heel to shin is unremarkable bilaterally. There is no truncal or gait ataxia.  Sensory exam: intact to light touch in the upper and lower extremities.  Gait, station and balance: He stands easily. No veering to one side is noted. No leaning to one side is noted. Posture is age-appropriate and stance is narrow based. Gait shows normal stride length and normal pace. No problems turning are noted. Tandem walk is unremarkable.  Assessment and Plan:  In summary, [name] is a very pleasant 61 y.o.-year old male with an underlying  medical history of metastatic prostate cancer diagnosed in 2015, recent hospitalization in February secondary to CAP of the left lower lung with pleural effusion and empyema, s/p surgical intervention on 09/06/16, history of ulcerative colitis, vitamin D deficiency, hyperlipidemia, hearing loss, and overweight state, who presents for consultation of his obstructive sleep apnea. He was previously diagnosed with severe obstructive sleep apnea. He would benefit from reevaluation and adjustment of his settings and modifying his interface to where he can tolerate the CPAP better and benefits from it subjectively as well as objectively.  I had a long chat with  the patient about my findings and the diagnosis of OSA, its prognosis and treatment options. We talked about medical treatments, surgical interventions and non-pharmacological approaches. I explained in particular the risks and ramifications of untreated moderate to severe OSA, especially with respect to developing cardiovascular disease down the Road, including congestive heart failure, difficult to treat hypertension, cardiac arrhythmias, or stroke. Even type 2 diabetes has, in part, been linked to untreated OSA. Symptoms of untreated OSA include daytime sleepiness, memory problems, mood irritability and mood disorder such as depression and anxiety, lack of energy, as well as recurrent headaches, especially morning headaches. We talked about trying to maintain a healthy lifestyle in general, as well as the importance of weight control. I encouraged the patient to eat healthy, exercise daily and keep well hydrated, to keep a scheduled bedtime and wake time routine, to not skip any meals and eat healthy snacks in between meals. I advised the patient not to drive when feeling sleepy. I recommended the following at this time: sleep study with potential positive airway pressure titration. (We will score hypopneas at 3%).   I explained the sleep test procedure to the  patient and also outlined possible surgical and non-surgical treatment options of OSA, including the use of a custom-made dental device (which would require a referral to a specialist dentist or oral surgeon), upper airway surgical options, such as pillar implants, radiofrequency surgery, tongue base surgery, and UPPP (which would involve a referral to an ENT surgeon). Rarely, jaw surgery such as mandibular advancement may be considered.  I also explained the CPAP treatment option to the patient, who indicated that he would be use CPAP if the need arises. I explained the importance of being compliant with PAP treatment, not only for insurance purposes but primarily to improve His symptoms, and for the patient's long term health benefit, including to reduce His cardiovascular risks. I answered all his questions today and the patient was in agreement. I would like to see him back after the sleep study is completed and encouraged him to call with any interim questions, concerns, problems or updates.   Thank you very much for allowing me to participate in the care of this nice patient. If I can be of any further assistance to you please do not hesitate to call me at [phone].  Sincerely,   Star Age, MD, PhD

## 2016-12-11 NOTE — Patient Instructions (Signed)

## 2016-12-13 ENCOUNTER — Telehealth: Payer: Self-pay | Admitting: Neurology

## 2016-12-13 DIAGNOSIS — Z9989 Dependence on other enabling machines and devices: Secondary | ICD-10-CM

## 2016-12-13 DIAGNOSIS — G4733 Obstructive sleep apnea (adult) (pediatric): Secondary | ICD-10-CM

## 2016-12-13 NOTE — Telephone Encounter (Signed)
HST order placed. 

## 2016-12-13 NOTE — Telephone Encounter (Signed)
[employer] denied [place].  Can I get an order for HST?

## 2017-01-15 ENCOUNTER — Ambulatory Visit: Payer: BLUE CROSS/BLUE SHIELD | Admitting: Thoracic Surgery (Cardiothoracic Vascular Surgery)

## 2017-01-22 ENCOUNTER — Institutional Professional Consult (permissible substitution): Payer: BLUE CROSS/BLUE SHIELD | Admitting: Pulmonary Disease

## 2017-01-24 ENCOUNTER — Ambulatory Visit (INDEPENDENT_AMBULATORY_CARE_PROVIDER_SITE_OTHER): Payer: BLUE CROSS/BLUE SHIELD | Admitting: Neurology

## 2017-01-24 DIAGNOSIS — G4733 Obstructive sleep apnea (adult) (pediatric): Secondary | ICD-10-CM

## 2017-01-28 ENCOUNTER — Other Ambulatory Visit: Payer: Self-pay | Admitting: Thoracic Surgery (Cardiothoracic Vascular Surgery)

## 2017-01-28 DIAGNOSIS — J869 Pyothorax without fistula: Secondary | ICD-10-CM

## 2017-01-29 ENCOUNTER — Encounter: Payer: Self-pay | Admitting: Thoracic Surgery (Cardiothoracic Vascular Surgery)

## 2017-01-29 ENCOUNTER — Ambulatory Visit
Admission: RE | Admit: 2017-01-29 | Discharge: 2017-01-29 | Disposition: A | Discharge status: Home / Self Care | Payer: BLUE CROSS/BLUE SHIELD | Source: Ambulatory Visit | Attending: Thoracic Surgery (Cardiothoracic Vascular Surgery) | Admitting: Thoracic Surgery (Cardiothoracic Vascular Surgery)

## 2017-01-29 ENCOUNTER — Ambulatory Visit (INDEPENDENT_AMBULATORY_CARE_PROVIDER_SITE_OTHER): Payer: BLUE CROSS/BLUE SHIELD | Admitting: Thoracic Surgery (Cardiothoracic Vascular Surgery)

## 2017-01-29 VITALS — BP 113/73 | HR 62 | Resp 16 | Ht 66.0 in | Wt 171.0 lb

## 2017-01-29 DIAGNOSIS — Z09 Encounter for follow-up examination after completed treatment for conditions other than malignant neoplasm: Secondary | ICD-10-CM

## 2017-01-29 DIAGNOSIS — J869 Pyothorax without fistula: Secondary | ICD-10-CM | POA: Diagnosis not present

## 2017-01-29 NOTE — Progress Notes (Signed)
[address]       [place] [address]             [phone]    HPI: Mr. Vecchio returns for a scheduled follow-up  He is a 61 year old man who underwent left VATS and drainage of an empyema on 09/06/2016. He did well postoperatively and went home on day 5. He continued to have a lot of discomfort and shortness of breath initially after discharge. He had some elevation of the left hemidiaphragm when the empyema was first noted on CT scan.  He feels well. He does not have any residual pain. His exercise tolerance is improving. He is back at work.  Past Medical History:  Diagnosis Date  . Allergy   . Anxiety   . Arthritis   . Hyperlipidemia   . 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  Marland Kitchen Neoplasm of face    benign  . Prostate cancer ([place])   . Sleep apnea   . Ulcerative colitis      Current Outpatient Prescriptions  Medication Sig Dispense Refill  . amitriptyline (ELAVIL) 10 MG tablet Take 10 mg by mouth at bedtime.    . balsalazide (COLAZAL) 750 MG capsule Take 750 mg by mouth 2 (two) times daily.     . Cholecalciferol (VITAMIN D) 2000 UNITS tablet Take 4,000 Units by mouth daily.    . fish oil-omega-3 fatty acids 1000 MG capsule Take 2 g by mouth daily.      Marland Kitchen glucosamine-chondroitin 500-400 MG tablet Take 1 tablet by mouth 3 (three) times daily.      . Lycopene 10 MG CAPS Take 10 mg by mouth.    . Multiple Vitamin (MULTIVITAMIN) tablet Take 1 tablet by mouth daily.      . simvastatin (ZOCOR) 20 MG tablet Take 20 mg by mouth daily after supper.  1  . valACYclovir (VALTREX) 500 MG tablet Take 500 mg by mouth 2 (two) times daily. PRN     No current facility-administered medications for this visit.     Physical Exam BP 113/73 (BP Location: Right Arm, Patient Position: Sitting, Cuff Size: Large)   Pulse 62   Resp 16   Ht 5\' 6"  (1.676 m)   Wt 171 lb (77.6 kg)   SpO2 96% Comment: RA  BMI 27.78 kg/m  61 year old man in no acute distress Alert  and oriented 3 no focal deficits Lungs clear with equal breath sounds bilaterally Cardiac regular rate and rhythm normal S1 and S2  Diagnostic Tests: CHEST  2 VIEW  COMPARISON:  11/13/2016  FINDINGS: The small loculated fluid collection in the left upper lung zone laterally has decreased further in size.  There is a minimal scarring at the left lung base laterally with blunting of costophrenic angles on the left  Right lung is clear.  Heart size and vascularity are normal.  IMPRESSION: Further decrease in the small loculated fluid collection in the left upper lung zone laterally. Chronic blunting of the costophrenic angles on the left. No acute abnormalities.   Electronically Signed   By: Lorriane Shire M.D.   On: 01/29/2017 09:46 I personally reviewed the chest x-ray and concur the findings noted above.  Impression: Mr. Tsang is a 61 year old gentleman gentleman who had a severe pneumonia complicated by empyema. He had drainage of the empyema and decortication on March 1. He is now over 4 months out from surgery and is doing extremely well with no residual  effects.  There is still a very small loculated fluid collection that has gotten smaller on each successive chest x-ray. At this point I don't think there is any concern with that and no additional follow-up is needed.  On his CT in February he was noted to have a 4.1 cm ascending aortic aneurysm. There is no indication for surgery but does need continued follow-up for that. He needs his chest demonstrated once a year. I will plan to do a CT on him in February. In the meantime, if he has a CT for evaluation of his metastatic prostate cancer he will not need a second study. Blood pressure control as the mainstay of treatment. His blood pressure is excellent.  Plan: Return in 6 months with CT of chest  [name], MD [facility] [phone]

## 2017-01-30 ENCOUNTER — Telehealth: Payer: Self-pay | Admitting: Neurology

## 2017-01-30 DIAGNOSIS — G4733 Obstructive sleep apnea (adult) (pediatric): Secondary | ICD-10-CM

## 2017-01-30 DIAGNOSIS — J189 Pneumonia, unspecified organism: Secondary | ICD-10-CM

## 2017-01-30 DIAGNOSIS — J181 Lobar pneumonia, unspecified organism: Secondary | ICD-10-CM

## 2017-01-30 DIAGNOSIS — Z789 Other specified health status: Secondary | ICD-10-CM

## 2017-01-30 DIAGNOSIS — J869 Pyothorax without fistula: Secondary | ICD-10-CM

## 2017-01-30 NOTE — Telephone Encounter (Signed)
Patient referred by Dr. Sandi Mariscal, seen by me on 12/11/16, HST on 01/24/17 (could not make a result note, I don't know why):   Please call and notify the patient that the recent home sleep test confirmed OSA with a total AHI of 7.2/hour, but O2 nadir was 74% and time below 88% saturation was over 30 minutes. This may in part be related to his lung disease (pneumonia and empyema in Feb). Given his difficulty with CPAP compliance and lower O2 values, I suggest we bring him back for a CPAP titration study for proper titration and mask fitting and O2 monitoring. Please explain to patient and arrange for a CPAP titration study. I have placed an order in the chart. Thanks, and please route to [facility] for scheduling.   Star Age, MD, PhD [facility])

## 2017-01-30 NOTE — Procedures (Signed)
  [facility] [place], [place] [address] NAME: [name] DOB: Aug 21, 1955 MEDICAL RECORD [id number] DOS: 01/24/17 REFERRING PHYSICIAN: Derinda Late, MD STUDY PERFORMED: HST HISTORY:  61 year old man with a history of metastatic prostate cancer diagnosed in 2015, recent hospitalization in February secondary to CAP of the left lower lung with pleural effusion and empyema, s/p surgical intervention on 09/06/16, history of ulcerative colitis, vitamin D deficiency, hyperlipidemia, hearing loss, and overweight state, who was previously diagnosed with obstructive sleep apnea several years ago and placed on CPAP therapy. He is having difficulty with CPAP treatment. ESS of 7/24. BMI of 28.5. STUDY RESULTS: Total Recording Time: 7h 26 min Total Apnea/Hypopnea Index (AHI): 7.2/hour Average Oxygen Saturation: 93% Lowest Oxygen Saturation: 74% Time below 88% saturation of 37 min Average Heart Rate: 60 bpm IMPRESSION: OSA RECOMMENDATION: This home sleep test demonstrates evidence of overall mild obstructive sleep apnea with a total AHI of 7.2/hour, but O2 nadir was 74% and time below 88% saturation was over 30 minutes. This may in part be related to his lung disease. Given the patient's medical history and sleep related complaints and difficulty with CPAP therapy, a full night CPAP titration study for proper treatment settings, O2 monitoring and mask fitting is recommended. Please note that untreated obstructive sleep apnea carries additional perioperative morbidity. Patients with significant obstructive sleep apnea should receive perioperative PAP therapy and the surgeons and particularly the anesthesiologist should be informed of the diagnosis and the severity of the sleep disordered breathing. The patient should be cautioned not to drive, work at heights, or operate dangerous or heavy equipment when tired or sleepy. Review and reiteration of good sleep  hygiene measures should be pursued with any patient. The patient and his referring provider will be notified of the test results. The patient will be seen in follow up in sleep clinic at [facility].  I certify that I have reviewed the raw data recording prior to the issuance of this report in accordance with the standards of the American Academy of Sleep Medicine (AASM).  Star Age, MD, PhD [facility]) Diplomat, ABPN (neurology and sleep)

## 2017-01-30 NOTE — Telephone Encounter (Signed)
I called [name]. I advised him that his HST confirmed osa and his O2 nadir was 74% with over 30 mins spent below 88% sats. Dr. Rexene Alberts thinks that this may be relatedto his lung disease. Given [name]'s difficulty with cpap compliance and lower O2 values, Dr. Rexene Alberts recommends that the [name] come back for a cpap titration study for proper titration and mask fitting and O2 monitoring. [name] is agreeable to proceeding with the cpap titration study and knows that our sleep lab will file with the [name]'s insurance and call him to schedule this study. [name] verbalized understanding of results. [name] had no questions at this time but was encouraged to call back if questions arise.

## 2017-01-31 ENCOUNTER — Telehealth: Payer: Self-pay | Admitting: Neurology

## 2017-01-31 DIAGNOSIS — G4733 Obstructive sleep apnea (adult) (pediatric): Secondary | ICD-10-CM

## 2017-01-31 NOTE — Telephone Encounter (Signed)
[employer] denied CPAP Titration suggest Auto pap °

## 2017-01-31 NOTE — Telephone Encounter (Signed)
error 

## 2017-01-31 NOTE — Telephone Encounter (Signed)
We will set patient up with autoPAP at home, as insurance denied in house titration study for OSA. Pls process order and notify patient and set up FU in [address] [place] with me or NP.     

## 2017-01-31 NOTE — Telephone Encounter (Signed)
I called pt to discuss. No answer, left a message asking him to call me back. If pt calls back tomorrow, please let him know that his cpap titration study was denied, and that Dr. Rexene Alberts recommends starting him on auto-pap. I will call the pt back on Monday to discuss the auto pap set up.

## 2017-02-01 NOTE — Progress Notes (Signed)
Please defer to Dr Rexene Alberts. CD

## 2017-02-04 NOTE — Telephone Encounter (Signed)
I called pt. I advised pt that his insurance denied the cpap titration study. Dr. Rexene Alberts recommends that pt start an auto pap at home. I reviewed PAP compliance expectations with the pt. Pt is agreeable to starting a auto-PAP. I advised pt that an order will be sent to a DME, [facility], and [facility] will call the pt within about one week after they file with the pt's insurance. [facility] will show the pt how to use the machine, fit for masks, and troubleshoot the auto-PAP if needed. A follow up appt was made for insurance purposes with Dr. Rexene Alberts on 04/09/2017 at 9:30am. Pt verbalized understanding to arrive 15 minutes early and bring their auto-PAP. A letter with all of this information in it will be mailed to the pt as a reminder. I verified with the pt that the address we have on file is correct. Pt verbalized understanding of results. Pt had no questions at this time but was encouraged to call back if questions arise.

## 2017-04-09 ENCOUNTER — Ambulatory Visit (INDEPENDENT_AMBULATORY_CARE_PROVIDER_SITE_OTHER): Payer: BLUE CROSS/BLUE SHIELD

## 2017-04-09 DIAGNOSIS — G4733 Obstructive sleep apnea (adult) (pediatric): Secondary | ICD-10-CM

## 2017-04-09 DIAGNOSIS — Z9989 Dependence on other enabling machines and devices: Secondary | ICD-10-CM

## 2017-04-09 NOTE — Progress Notes (Signed)
PATIENT: [name] DOB: Nov 27, 1955  REASON FOR VISIT: CPAP compliance HISTORY FROM: patient  HISTORY OF PRESENT ILLNESS: Today 04/09/17: [name] is a 61 year old male with history of obstructive sleep Apnea on CPAP. He returns today for a compliance download. His download shows that he uses his machine 30/30 days for a compliance of 100%. He used his machine >4 hours 30/30 nights. On average he uses his machine regularly every night.  He currently is using ResMed AirFit10 medium mask. Patient reports that he changes out his supplies approximately every 6 months. Overall patient feels that CPAP has improved his sleepiness and fatigue. Since the last visit the patient denies any new symptoms.   HISTORY 12/11/16: Copied from Dr. Guadelupe Sabin notes: Mr. [name] is a 61 year old right-handed gentleman with an underlying medical history of metastatic prostate cancer diagnosed in 2015, recent hospitalization in February secondary to CAP of the left lower lung with pleural effusion and empyema, s/p surgical intervention on 09/06/16, history of ulcerative colitis, vitamin D deficiency, hyperlipidemia, hearing loss, and overweight state, who was previously diagnosed with obstructive sleep apnea several years ago and placed on CPAP therapy. Prior sleep study results were reviewed from 08/26/2006 (interpreted by Dr. Danton Sewer): AHI was 32/hour, O2 91% on average, nadir was 74%, a CPAP download is not available for my review today. He reports being about 50% compliant with his current CPAP, has had some discomfort with the interface, no telltale difference in his sleep quality when he uses his CPAP versus without. Epworth sleepiness score is 7 out of 24 today, fatigue score is 48 out of 63. He is separated and lives alone. He has a Industrial/product designer business. He has 3 children. He quit smoking in 1987, drinks alcohol about 3-4 times a week, denies illicit drug use, drinks caffeine in the form of coffee, 2 cups per day on  average. I reviewed your office note from 10/29/2016 which you kindly included.  Last CXR on 11/13/16 showed:   IMPRESSION: 1. Continued improvement in the loculated left pleural effusion compared to 10/16/2016. 2. No new or acute cardiopulmonary process. He is followed by a CT surgeon.   Weight was reported to be 182 lb in 2008, he lost about 10 lb recently in the context of his recent illness and hospitalization.  His bedtime is between 9 and 10 PM, wakeup time between 5 and 6. He has nocturia usually once or twice per average night, denies morning headaches or telltale restless leg symptoms but has woken up with occasional leg jerking. He does not have any family history of obstructive sleep apnea. For his prostate cancer he is followed by Dr. Elza Rafter in [facility].   REVIEW OF SYSTEMS: Out of a complete 14 system review of symptoms, the patient complains only of the following symptoms, and all other reviewed systems are negative.  ALLERGIES: Allergies  Allergen Reactions  . Sulfamethoxazole     REACTION: unspecified    HOME MEDICATIONS: Outpatient Medications Prior to Visit  Medication Sig Dispense Refill  . acetaminophen (TYLENOL) 325 MG tablet Take 650 mg by mouth.    Marland Kitchen amitriptyline (ELAVIL) 10 MG tablet Take 10 mg by mouth at bedtime.    Marland Kitchen aspirin EC 81 MG tablet Take 81 mg by mouth daily.    . balsalazide (COLAZAL) 750 MG capsule Take 750 mg by mouth 2 (two) times daily.     . celecoxib (CELEBREX) 200 MG capsule Take 200 mg by mouth daily.    Marland Kitchen  Cholecalciferol (VITAMIN D) 2000 UNITS tablet Take 4,000 Units by mouth daily.    . diphenhydrAMINE (BENADRYL) 25 MG tablet Take 25 mg by mouth as needed.    . fish oil-omega-3 fatty acids 1000 MG capsule Take 2 g by mouth daily.      Marland Kitchen glucosamine-chondroitin 500-400 MG tablet Take 1 tablet by mouth 3 (three) times daily.      . Lycopene 10 MG CAPS Take 10 mg by mouth.    . Multiple Vitamin (MULTIVITAMIN) tablet  Take 1 tablet by mouth daily.      . simvastatin (ZOCOR) 20 MG tablet Take 20 mg by mouth daily after supper.  1  . valACYclovir (VALTREX) 500 MG tablet Take 500 mg by mouth 2 (two) times daily. PRN    . oxyCODONE-acetaminophen (PERCOCET/ROXICET) 5-325 MG tablet Take by mouth.    . zolpidem (AMBIEN CR) 6.25 MG CR tablet Take 6.25 mg by mouth at bedtime as needed for sleep.     No facility-administered medications prior to visit.     PAST MEDICAL HISTORY: Past Medical History:  Diagnosis Date  . Allergy   . Anxiety   . Arthritis   . Hepatic steatosis 2016  . Hyperlipidemia   . Intermittent depression   . 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  Marland Kitchen Neoplasm of face    benign  . Pneumonia    left lung with a loculated effusion   . Prostate cancer ([place])   . Recurrent HSV (herpes simplex virus)   . Sleep apnea   . Ulcerative colitis     PAST SURGICAL HISTORY: Past Surgical History:  Procedure Laterality Date  . DECORTICATION Left 09/06/2016   Procedure: DECORTICATION;  Surgeon: [name], MD;  Location: [facility];  Service: Thoracic;  Laterality: Left;  . INGUINAL HERNIA REPAIR    . PROSTATECTOMY  04/2015  . SHOULDER ARTHROTOMY Right   . VIDEO ASSISTED THORACOSCOPY (VATS)/EMPYEMA Left 09/06/2016   Procedure: VIDEO ASSISTED THORACOSCOPY (VATS)/DRAIN EMPYEMA;  Surgeon: [name], MD;  Location: [facility];  Service: Thoracic;  Laterality: Left;  Marland Kitchen VIDEO BRONCHOSCOPY N/A 09/06/2016   Procedure: VIDEO BRONCHOSCOPY;  Surgeon: [name], MD;  Location: [facility] OR;  Service: Thoracic;  Laterality: N/A;    FAMILY HISTORY: Family History  Problem Relation Age of Onset  . Heart attack Father   . Hypertension Father   . Hyperlipidemia Unknown   . Diabetes Unknown   . Thyroid disease Unknown   . Lung cancer Unknown   . Breast cancer Unknown   . Colon cancer Unknown   . Juvenile idiopathic arthritis Daughter   . Raynaud syndrome Daughter   . Breast cancer  Mother     SOCIAL HISTORY: Social History   Social History  . Marital status: Legally Separated    Spouse name: N/A  . Number of children: 3  . Years of education: N/A   Occupational History  . Flooring Contractor    Social History Main Topics  . Smoking status: Former Research scientist (life sciences)  . Smokeless tobacco: Never Used     Comment: quit 1992  . Alcohol use 0.0 oz/week  . Drug use: No  . Sexual activity: Not on file   Other Topics Concern  . Not on file   Social History Narrative   [facility] (09/04/16):   Currently he owns a flooring company. Questionable asbestos exposure. No mold exposure. Originally from [place]. Does have a dog but no bird exposure.  PHYSICAL EXAM   Generalized: Well developed, in no acute distress  HEENT: neck circumference Mallampati CHEST: Lungs clear to auscultation bilaterally     ASSESSMENT AND PLAN 61 y.o. year old male  has a past medical history of Allergy; Anxiety; Arthritis; Hepatic steatosis (2016); Hyperlipidemia; Intermittent depression; Metastatic cancer to bone [facility]); Neoplasm of face; Pneumonia; Prostate cancer ([place]); Recurrent HSV (herpes simplex virus); Sleep apnea; and Ulcerative colitis. here with:  1. OSA on CPAP  Overall the patient is doing well. His CPAP Auto download shows excellent compliance and good treatment of his apnea.He currently uses a Res [employer] Fit10 medium mask.  He is advised to use Auto CPAP nightly, greater than four hours each night. He is advised that if their symptoms worsen or if they develop new symptoms to let us know. Otherwise, he should follow-up in 1 year or sooner if needed.   I spent 15 minutes with the patient, 50 % of this time was spent reviewing CPAP download and discussing risks associated with untreated sleep apnea.  Justin Mend, RPSGT 04/09/2017, 12:57 PM [facility] [address] [place], [place] [address] [phone]  I reviewed the above note  and documentation by the above named RPSGT and agree with the history, physical exam, assessment and plan as outlined above.   Star Age, MD, PhD [facility])

## 2017-05-01 ENCOUNTER — Encounter (INDEPENDENT_AMBULATORY_CARE_PROVIDER_SITE_OTHER): Payer: Self-pay

## 2017-05-01 ENCOUNTER — Encounter: Payer: Self-pay | Admitting: Gastroenterology

## 2017-05-01 ENCOUNTER — Ambulatory Visit (INDEPENDENT_AMBULATORY_CARE_PROVIDER_SITE_OTHER): Payer: BLUE CROSS/BLUE SHIELD | Admitting: Gastroenterology

## 2017-05-01 VITALS — BP 124/66 | HR 80 | Ht 66.0 in | Wt 179.0 lb

## 2017-05-01 DIAGNOSIS — K5130 Ulcerative (chronic) rectosigmoiditis without complications: Secondary | ICD-10-CM

## 2017-05-01 MED ORDER — SUPREP BOWEL PREP KIT 17.5-3.13-1.6 GM/177ML PO SOLN: ORAL | 0 refills | Status: DC

## 2017-05-01 MED ORDER — BALSALAZIDE DISODIUM 750 MG PO CAPS: 750.0000 mg | ORAL_CAPSULE | Freq: Two times a day (BID) | ORAL | 3 refills | Status: DC

## 2017-05-01 NOTE — Patient Instructions (Signed)
You have been scheduled for a colonoscopy. Please follow written instructions given to you at your visit today.  Please pick up your prep supplies at the pharmacy within the next 1-3 days. If you use inhalers (even only as needed), please bring them with you on the day of your procedure. Your physician has requested that you go to www.startemmi.com and enter the access code given to you at your visit today. This web site gives a general overview about your procedure. However, you should still follow specific instructions given to you by our office regarding your preparation for the procedure.  We have sent the following medications to your pharmacy for you to pick up at your convenience: Colazal  If you are age 37 or older, your body mass index should be between 23-30. Your Body mass index is 28.89 kg/m. If this is out of the aforementioned range listed, please consider follow up with your Primary Care Provider.  If you are age 46 or younger, your body mass index should be between 19-25. Your Body mass index is 28.89 kg/m. If this is out of the aformentioned range listed, please consider follow up with your Primary Care Provider.   Thank you.

## 2017-05-01 NOTE — Progress Notes (Signed)
HPI :  61 y/o male with history of UC, left sided colitis, diagnosed in 2002. On colazol 1.5gm BID  He reports doing pretty well in regards to his colitis since last time he was seen. He's had no flares of his colitis over the past 2 years, he averages 2 formed bowel movement per day. He endorses scant rectal bleeding at times when is constipated. He denies any abdominal pains. He has no family history of colon cancer. He is tolerating his colazol well. Last colonoscopy in 2016.   Since his last visit he had a pneumonia which required a VATS procedure or drainage of fluid. He's also had prostate cancer in 2015, status post surgery and then chemotherapy for metastatic disease. He has had XRT for metastatic lesions to the rib. His PSA is noted to be rising recently concerning for recurrent disease, followed by Oncology.    Colonoscopy 08/2014 - small adenoma of rectum, otherwise normal.   Past Medical History:  Diagnosis Date  . Allergy   . Anxiety   . Arthritis   . Hepatic steatosis 2016  . Hyperlipidemia   . Intermittent depression   . 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  Marland Kitchen Neoplasm of face    benign  . Pneumonia    left lung with a loculated effusion   . Prostate cancer ([place])   . Recurrent HSV (herpes simplex virus)   . Sleep apnea   . Ulcerative colitis      Past Surgical History:  Procedure Laterality Date  . DECORTICATION Left 09/06/2016   Procedure: DECORTICATION;  Surgeon: [name], MD;  Location: [place];  Service: Thoracic;  Laterality: Left;  . INGUINAL HERNIA REPAIR    . PROSTATECTOMY  04/2015  . SHOULDER ARTHROTOMY Right   . VIDEO ASSISTED THORACOSCOPY (VATS)/EMPYEMA Left 09/06/2016   Procedure: VIDEO ASSISTED THORACOSCOPY (VATS)/DRAIN EMPYEMA;  Surgeon: [name], MD;  Location: [facility];  Service: Thoracic;  Laterality: Left;  Marland Kitchen VIDEO BRONCHOSCOPY N/A 09/06/2016   Procedure: VIDEO BRONCHOSCOPY;  Surgeon: [name], MD;   Location[facility] OR;  Service: Thoracic;  Laterality: N/A;   Family History  Problem Relation Age of Onset  . Heart attack Father   . Hypertension Father   . Hyperlipidemia Unknown   . Diabetes Unknown   . Thyroid disease Unknown   . Lung cancer Unknown   . Breast cancer Unknown   . Colon cancer Unknown   . Juvenile idiopathic arthritis Daughter   . Raynaud syndrome Daughter   . Breast cancer Mother    Social History  Substance Use Topics  . Smoking status: Former Research scientist (life sciences)  . Smokeless tobacco: Never Used     Comment: quit 1992  . Alcohol use 0.0 oz/week   Current Outpatient Prescriptions  Medication Sig Dispense Refill  . acetaminophen (TYLENOL) 325 MG tablet Take 650 mg by mouth.    Marland Kitchen amitriptyline (ELAVIL) 10 MG tablet Take 10 mg by mouth at bedtime.    Marland Kitchen aspirin EC 81 MG tablet Take 81 mg by mouth daily.    . balsalazide (COLAZAL) 750 MG capsule Take 750 mg by mouth 2 (two) times daily.     . celecoxib (CELEBREX) 200 MG capsule Take 200 mg by mouth daily.    . Cholecalciferol (VITAMIN D) 2000 UNITS tablet Take 4,000 Units by mouth daily.    . diphenhydrAMINE (BENADRYL) 25 MG tablet Take 25 mg by mouth as needed.    . fish  oil-omega-3 fatty acids 1000 MG capsule Take 2 g by mouth daily.      Marland Kitchen glucosamine-chondroitin 500-400 MG tablet Take 1 tablet by mouth 3 (three) times daily.      . Lycopene 10 MG CAPS Take 10 mg by mouth.    . Multiple Vitamin (MULTIVITAMIN) tablet Take 1 tablet by mouth daily.      . simvastatin (ZOCOR) 20 MG tablet Take 20 mg by mouth daily after supper.  1  . valACYclovir (VALTREX) 500 MG tablet Take 500 mg by mouth 2 (two) times daily. PRN     No current facility-administered medications for this visit.    Allergies  Allergen Reactions  . Sulfamethoxazole     REACTION: unspecified     Review of Systems: All systems reviewed and negative except where noted in HPI.   Labs reveiwed in care everywhere - Hgb 14.0 on 10/23. Cr of 1.2, LFTs  normal  Physical Exam: BP 124/66   Pulse 80   Ht 5\' 6"  (1.676 m)   Wt 179 lb (81.2 kg)   BMI 28.89 kg/m  Constitutional: Pleasant,well-developed, male in no acute distress. HEENT: Normocephalic and atraumatic. Conjunctivae are normal. No scleral icterus. Neck supple.  Cardiovascular: Normal rate, regular rhythm.  Pulmonary/chest: Effort normal and breath sounds normal. No wheezing, rales or rhonchi. Abdominal: Soft, nondistended, nontender. . There are no masses palpable. No hepatomegaly. Extremities: no edema Lymphadenopathy: No cervical adenopathy noted. Neurological: Alert and oriented to person place and time. Skin: Skin is warm and dry. No rashes noted. Psychiatric: Normal mood and affect. Behavior is normal.   ASSESSMENT AND PLAN: 61 year old male with predominantly left-sided ulcerative colitis, disease for over 16 years. Generally doing really well on Colazol monotherapy, no flares in recent years.  We discussed goals of care to include clinical and endoscopic remission. Last colonoscopy in Feb 2016 looked good other than one small adenoma. We discussed guidelines for surveillance colonoscopy and if he wanted to have another exam in the next year given his longstanding disease, but also in light of his suspected recurrent prostate cancer in that he will likely be receiving therapy for that. Following this discussion he wanted to proceed with colonoscopy. If he continues to be in endoscopic remission I think we can lengthen the interval in between his surveillance exams. Otherwise continue Colazol, recent labs normal. Flu shot UTD. He agreed with the plan.   Thiells Cellar, MD [facility] Pager [phone]

## 2017-05-06 ENCOUNTER — Ambulatory Visit (AMBULATORY_SURGERY_CENTER): Payer: BLUE CROSS/BLUE SHIELD | Admitting: Gastroenterology

## 2017-05-06 ENCOUNTER — Encounter: Payer: Self-pay | Admitting: Gastroenterology

## 2017-05-06 VITALS — BP 120/70 | HR 61 | Temp 97.3°F | Resp 11 | Ht 66.0 in | Wt 179.0 lb

## 2017-05-06 DIAGNOSIS — K51319 Ulcerative (chronic) rectosigmoiditis with unspecified complications: Secondary | ICD-10-CM

## 2017-05-06 MED ORDER — SODIUM CHLORIDE 0.9 % IV SOLN: 500.0000 mL | INTRAVENOUS | Status: DC

## 2017-05-06 NOTE — Op Note (Signed)
[place] Patient Name: [name] Procedure Date: 05/06/2017 2:42 PM MRN: [id number] Endoscopist: Remo Lipps P. Armbruster MD, MD Age: 61 Referring MD:  Date of Birth: 06-Aug-1955 Gender: Male Account #: [id number] Procedure:                Colonoscopy Indications:              Surveillance of left-sided chronic ulcerative                            colitis, on Colazol Medicines:                Monitored Anesthesia Care Procedure:                Pre-Anesthesia Assessment:                           - Prior to the procedure, a History and Physical                            was performed, and patient medications and                            allergies were reviewed. The patient's tolerance of                            previous anesthesia was also reviewed. The risks                            and benefits of the procedure and the sedation                            options and risks were discussed with the patient.                            All questions were answered, and informed consent                            was obtained. Prior Anticoagulants: The patient has                            taken no previous anticoagulant or antiplatelet                            agents. ASA Grade Assessment: II - A patient with                            mild systemic disease. After reviewing the risks                            and benefits, the patient was deemed in                            satisfactory condition to undergo the procedure.  After obtaining informed consent, the colonoscope                            was passed under direct vision. Throughout the                            procedure, the patient's blood pressure, pulse, and                            oxygen saturations were monitored continuously. The                            Colonoscope was introduced through the anus and                            advanced to the the terminal ileum, with                             identification of the appendiceal orifice and IC                            valve. The colonoscopy was performed without                            difficulty. The patient tolerated the procedure                            well. The quality of the bowel preparation was                            good. The terminal ileum, ileocecal valve,                            appendiceal orifice, and rectum were photographed. Scope In: 2:50:09 PM Scope Out: 3:07:25 PM Scope Withdrawal Time: 0 hours 13 minutes 39 seconds  Total Procedure Duration: 0 hours 17 minutes 16 seconds  Findings:                 The perianal and digital rectal examinations were                            normal.                           The terminal ileum appeared normal.                           Internal hemorrhoids were found during                            retroflexion. The hemorrhoids were small.                           The exam was otherwise without abnormality. No  active inflammatory changes appreciated, no polyps.                           Biopsies were taken with a cold forceps from the                            right colon, left colon and transverse colon. These                            biopsy specimens were sent to Pathology. Complications:            No immediate complications. Estimated blood loss:                            Minimal. Estimated Blood Loss:     Estimated blood loss was minimal. Impression:               - The examined portion of the ileum was normal.                           - Internal hemorrhoids.                           - The examination was otherwise normal.                           - Biopsies for surveillance were taken. Recommendation:           - Patient has a contact number available for                            emergencies. The signs and symptoms of potential                            delayed complications were discussed  with the                            patient. Return to normal activities tomorrow.                            Written discharge instructions were provided to the                            patient.                           - Resume previous diet.                           - Continue present medications.                           - Await pathology results. Remo Lipps P. Armbruster MD, MD 05/06/2017 3:11:23 PM This report has been signed electronically.

## 2017-05-06 NOTE — Progress Notes (Signed)
Report to PACU, RN, vss, BBS= Clear.  

## 2017-05-06 NOTE — Progress Notes (Signed)
Called to room to assist during endoscopic procedure.  Patient ID and intended procedure confirmed with present staff. Received instructions for my participation in the procedure from the performing physician.  

## 2017-05-06 NOTE — Patient Instructions (Signed)
YOU HAD AN ENDOSCOPIC PROCEDURE TODAY AT THE [facility]:   Refer to the procedure report that was given to you for any specific questions about what was found during the examination.  If the procedure report does not answer your questions, please call your gastroenterologist to clarify.  If you requested that your care partner not be given the details of your procedure findings, then the procedure report has been included in a sealed envelope for you to review at your convenience later.  YOU SHOULD EXPECT: Some feelings of bloating in the abdomen. Passage of more gas than usual.  Walking can help get rid of the air that was put into your GI tract during the procedure and reduce the bloating. If you had a lower endoscopy (such as a colonoscopy or flexible sigmoidoscopy) you may notice spotting of blood in your stool or on the toilet paper. If you underwent a bowel prep for your procedure, you may not have a normal bowel movement for a few days.  Please Note:  You might notice some irritation and congestion in your nose or some drainage.  This is from the oxygen used during your procedure.  There is no need for concern and it should clear up in a day or so.  SYMPTOMS TO REPORT IMMEDIATELY:   Following lower endoscopy (colonoscopy or flexible sigmoidoscopy):  Excessive amounts of blood in the stool  Significant tenderness or worsening of abdominal pains  Swelling of the abdomen that is new, acute  Fever of 100F or higher  Please see handout on Hemorrhoids.  For urgent or emergent issues, a gastroenterologist can be reached at any hour by calling [phone].   DIET:  We do recommend a small meal at first, but then you may proceed to your regular diet.  Drink plenty of fluids but you should avoid alcoholic beverages for 24 hours.  ACTIVITY:  You should plan to take it easy for the rest of today and you should NOT DRIVE or use heavy machinery until tomorrow (because of the sedation  medicines used during the test).    FOLLOW UP: Our staff will call the number listed on your records the next business day following your procedure to check on you and address any questions or concerns that you may have regarding the information given to you following your procedure. If we do not reach you, we will leave a message.  However, if you are feeling well and you are not experiencing any problems, there is no need to return our call.  We will assume that you have returned to your regular daily activities without incident.  If any biopsies were taken you will be contacted by phone or by letter within the next 1-3 [place].  Please call us at [phone] if you have not heard about the biopsies in 3 [place].    SIGNATURES/CONFIDENTIALITY: You and/or your care partner have signed paperwork which will be entered into your electronic medical record.  These signatures attest to the fact that that the information above on your After Visit Summary has been reviewed and is understood.  Full responsibility of the confidentiality of this discharge information lies with you and/or your care-partner.  Thank you for letting us take care of your healthcare needs today. 

## 2017-05-07 ENCOUNTER — Telehealth: Payer: Self-pay | Admitting: *Deleted

## 2017-05-07 NOTE — Telephone Encounter (Signed)
No answer, left message to call if questions or concerns. 

## 2017-05-07 NOTE — Telephone Encounter (Signed)
No answer. Left message to call if questions or concerns. 

## 2017-05-10 ENCOUNTER — Encounter: Payer: Self-pay | Admitting: Gastroenterology

## 2017-06-26 ENCOUNTER — Other Ambulatory Visit: Payer: Self-pay | Admitting: *Deleted

## 2017-06-26 DIAGNOSIS — I712 Thoracic aortic aneurysm, without rupture, unspecified: Secondary | ICD-10-CM

## 2017-06-26 NOTE — Progress Notes (Unsigned)
Bun c

## 2017-07-17 ENCOUNTER — Other Ambulatory Visit: Payer: Self-pay | Admitting: Family Medicine

## 2017-07-17 ENCOUNTER — Ambulatory Visit
Admission: RE | Admit: 2017-07-17 | Discharge: 2017-07-17 | Disposition: A | Discharge status: Home / Self Care | Payer: BLUE CROSS/BLUE SHIELD | Source: Ambulatory Visit | Attending: Family Medicine | Admitting: Family Medicine

## 2017-07-17 DIAGNOSIS — R05 Cough: Secondary | ICD-10-CM

## 2017-07-17 DIAGNOSIS — R059 Cough, unspecified: Secondary | ICD-10-CM

## 2017-07-22 ENCOUNTER — Other Ambulatory Visit: Payer: Self-pay | Admitting: Thoracic Surgery (Cardiothoracic Vascular Surgery)

## 2017-07-23 ENCOUNTER — Ambulatory Visit (INDEPENDENT_AMBULATORY_CARE_PROVIDER_SITE_OTHER): Payer: BLUE CROSS/BLUE SHIELD | Admitting: Thoracic Surgery (Cardiothoracic Vascular Surgery)

## 2017-07-23 ENCOUNTER — Ambulatory Visit
Admission: RE | Admit: 2017-07-23 | Discharge: 2017-07-23 | Disposition: A | Discharge status: Home / Self Care | Payer: BLUE CROSS/BLUE SHIELD | Source: Ambulatory Visit | Attending: Thoracic Surgery (Cardiothoracic Vascular Surgery) | Admitting: Thoracic Surgery (Cardiothoracic Vascular Surgery)

## 2017-07-23 VITALS — BP 132/79 | HR 87 | Resp 20 | Ht 66.0 in | Wt 182.0 lb

## 2017-07-23 DIAGNOSIS — I712 Thoracic aortic aneurysm, without rupture, unspecified: Secondary | ICD-10-CM

## 2017-07-23 DIAGNOSIS — I7121 Aneurysm of the ascending aorta, without rupture: Secondary | ICD-10-CM

## 2017-07-23 MED ORDER — IOPAMIDOL (ISOVUE-370) INJECTION 76%
75.0000 mL | Freq: Once | INTRAVENOUS | Status: AC | PRN
  Administered 2017-07-23: 75 mL via INTRAVENOUS

## 2017-07-23 NOTE — Progress Notes (Signed)
[address]       [place] [address]             [phone]      HPI: 2 [name] returns for follow-up of his ascending aneurysm  [name] is a 62 year old man who had a left VATS and drainage of an empyema and March 2018.  On his preoperative CT he was noted to have elevation of the left hemidiaphragm and a 4.1 cm ascending aneurysm.  He was feeling well until a few days ago when he developed some upper respiratory congestion.  He saw his family doctor about that.  He has not had any chest pain, pressure, or tightness.  He denies shortness of breath.  Past Medical History:  Diagnosis Date  . Allergy   . Anxiety   . Arthritis   . Hepatic steatosis 2016  . Hyperlipidemia   . Intermittent depression   . Metastatic cancer to bone ([place])    left rib.  Marland Kitchen Neoplasm of face    benign  . Pneumonia    left lung with a loculated effusion   . Prostate cancer ([place])   . Recurrent HSV (herpes simplex virus)   . Sleep apnea   . Ulcerative colitis     Current Outpatient Medications  Medication Sig Dispense Refill  . amitriptyline (ELAVIL) 10 MG tablet Take 10 mg by mouth at bedtime.    Marland Kitchen aspirin EC 81 MG tablet Take 81 mg by mouth daily.    . balsalazide (COLAZAL) 750 MG capsule Take 1 capsule (750 mg total) by mouth 2 (two) times daily. 90 capsule 3  . celecoxib (CELEBREX) 200 MG capsule Take 200 mg by mouth daily.    . Cholecalciferol (VITAMIN D) 2000 UNITS tablet Take 4,000 Units by mouth daily.    . diphenhydrAMINE (BENADRYL) 25 MG tablet Take 25 mg by mouth as needed.    . fish oil-omega-3 fatty acids 1000 MG capsule Take 2 g by mouth daily.      Marland Kitchen glucosamine-chondroitin 500-400 MG tablet Take 1 tablet by mouth 3 (three) times daily.      . Lycopene 10 MG CAPS Take 10 mg by mouth.    . Multiple Vitamin (MULTIVITAMIN) tablet Take 1 tablet by mouth daily.      . predniSONE (DELTASONE) 5 MG tablet   4  . simvastatin (ZOCOR) 20 MG tablet Take 20 mg by mouth daily  after supper.  1  . valACYclovir (VALTREX) 500 MG tablet Take 500 mg by mouth 2 (two) times daily. PRN    . ZYTIGA 250 MG tablet   4   No current facility-administered medications for this visit.     Physical Exam BP 132/79   Pulse 87   Resp 20   Ht 5\' 6"  (1.676 m)   Wt 182 lb (82.6 kg)   SpO2 97% Comment: RA  BMI 29.2 kg/m  62 year old man in no acute distress Alert and oriented x3 with no focal deficits Lungs clear with equal breath sounds bilaterally No carotid bruits Cardiac regular rate and rhythm, normal S1 and S2  Diagnostic Tests: CT ANGIOGRAPHY CHEST WITH CONTRAST  TECHNIQUE: Multidetector CT imaging of the chest was performed using the standard protocol during bolus administration of intravenous contrast. Multiplanar CT image reconstructions and MIPs were obtained to evaluate the vascular anatomy.  CONTRAST:  71mL ISOVUE-370 IOPAMIDOL (ISOVUE-370) INJECTION 76%  COMPARISON:  CT chest dated September 04, 2016.  FINDINGS: Cardiovascular: Motion artifact  somewhat limits evaluation of the ascending thoracic aorta, however the aneurysmal dilatation is unchanged, measuring 4.1 cm. No evidence of aortic dissection. Normal heart size. No pericardial effusion. No central pulmonary embolism.  Mediastinum/Nodes: No enlarged mediastinal, hilar, or axillary lymph nodes. Thyroid gland, trachea, and esophagus demonstrate no significant findings.  Lungs/Pleura: Bibasilar atelectasis. No pleural effusion or pneumothorax. No suspicious pulmonary nodule.  Upper Abdomen: No acute abnormality. Two small cysts in the liver are unchanged.  Musculoskeletal: Unchanged sclerotic lesion in the posterior left sixth rib. No new sclerotic lesions. No acute fracture.  Review of the MIP images confirms the above findings.  IMPRESSION: 1. Unchanged ascending thoracic aortic aneurysm, measuring 4.1 cm. Recommend continued annual imaging followup by CTA or MRA.  This recommendation follows 2010 ACCF/AHA/AATS/ACR/ASA/SCA/SCAI/SIR/STS/SVM Guidelines for the Diagnosis and Management of Patients with Thoracic Aortic Disease. Circulation. 2010; 121: X448-J856 2. Unchanged sclerotic lesion in the posterior left sixth rib, consistent with history of metastatic prostate cancer. No new sclerotic lesions.   Electronically Signed   By: Titus Dubin M.D.   On: 07/23/2017 12:15 I personally reviewed the CT images and concur with the findings noted above  Impression: [name] is a 62 year old gentleman who was incidentally found to have an ascending aneurysm on a CT that was done for an empyema about a year ago.  The aneurysm is unchanged over the past year.  I do think this likely is a real aneurysm rather than just arteriomegaly given the elongation of the ascending aorta is manifested by displacement of the great vessels origins to the left.  There is no indication for surgical intervention until the aneurysm is either 5.5 cm or shows more than 5 mm growth between scans.  He needs continued 65-month follow-up.  Blood pressure is well controlled.  Empyema-resolved post decortication.  Elevation of left hemidiaphragm-appears improved on today's CT relative to the preoperative CT  Plan: Return in 1 year with CT angiogram of chest  [name], MD [facility] [phone]

## 2017-12-08 ENCOUNTER — Other Ambulatory Visit: Payer: Self-pay | Admitting: Gastroenterology

## 2018-04-09 ENCOUNTER — Encounter: Payer: Self-pay | Admitting: Adult Health

## 2018-04-09 ENCOUNTER — Ambulatory Visit (INDEPENDENT_AMBULATORY_CARE_PROVIDER_SITE_OTHER): Payer: BLUE CROSS/BLUE SHIELD | Admitting: Adult Health

## 2018-04-09 VITALS — BP 125/82 | HR 70 | Ht 67.0 in | Wt 180.6 lb

## 2018-04-09 DIAGNOSIS — Z9989 Dependence on other enabling machines and devices: Secondary | ICD-10-CM | POA: Diagnosis not present

## 2018-04-09 DIAGNOSIS — G4733 Obstructive sleep apnea (adult) (pediatric): Secondary | ICD-10-CM

## 2018-04-09 NOTE — Patient Instructions (Signed)
Your Plan:  Continue using CPAP nightly and >4 hours each night If your symptoms worsen or you develop new symptoms please let us know.   Thank you for coming to see us at [facility]. I hope we have been able to provide you high quality care today.  You may receive a patient satisfaction survey over the next few [place]. We would appreciate your feedback and comments so that we may continue to improve ourselves and the health of our patients.  

## 2018-04-09 NOTE — Progress Notes (Addendum)
PATIENT: [name] DOB: [birth year]  REASON FOR VISIT: follow up HISTORY FROM: patient  HISTORY OF PRESENT ILLNESS: Today 04/09/18: [name] is a 62 year old male with a history of obstructive sleep apnea on CPAP.  He returns today for follow-up.  His CPAP download indicates that he uses machine 21 out of 30 days for compliance of 70%.  He uses machine greater than 4 hours 17 days for compliance of 57%.  On average he uses his machine 5 hours and 44 minutes.  His residual AHI is 2.4 on 5 to 13 cm of water with EPR of 3.  He does not have a significant leak.  He states that there is some nights that he is exhausted and just simply does not put the machine on.  His Epworth sleepiness score is 8.  He is being followed at [facility] for prostate cancer.  He notes that since he started treatment he is noticed some changes in his memory.  According to their note they have refer the patient to neuropsychology for evaluation.  He returns today for evaluation.   HISTORY 04/09/17: [name] is a 62 year old male with history of obstructive sleep Apnea on CPAP. He returns today for a compliance download. His download shows that he uses his machine 30/30 days for a compliance of 100%. He used his machine >4 hours 30/30 nights. On average he uses his machine regularly every night.  He currently is using ResMed AirFit10 medium mask. Patient reports that he changes out his supplies approximately every 6 months. Overall patient feels that CPAP has improved his sleepiness and fatigue. Since the last visit the patient denies any new symptoms.  REVIEW OF SYSTEMS: Out of a complete 14 system review of symptoms, the patient complains only of the following symptoms, and all other reviewed systems are negative.  See HPI  ALLERGIES: Allergies  Allergen Reactions  . Sulfamethoxazole     REACTION: unspecified    HOME MEDICATIONS: Outpatient Medications Prior to Visit  Medication Sig Dispense Refill  .  amitriptyline (ELAVIL) 10 MG tablet Take 10 mg by mouth at bedtime.    Marland Kitchen aspirin EC 81 MG tablet Take 81 mg by mouth daily.    . balsalazide (COLAZAL) 750 MG capsule TAKE 1 CAPSULE(750 MG) BY MOUTH TWICE DAILY 60 capsule 3  . celecoxib (CELEBREX) 200 MG capsule Take 200 mg by mouth daily.    . Cholecalciferol (VITAMIN D) 2000 UNITS tablet Take 4,000 Units by mouth daily.    . diphenhydrAMINE (BENADRYL) 25 MG tablet Take 25 mg by mouth as needed.    . fish oil-omega-3 fatty acids 1000 MG capsule Take 2 g by mouth daily.      Marland Kitchen glucosamine-chondroitin 500-400 MG tablet Take 1 tablet by mouth 3 (three) times daily.      . Lycopene 10 MG CAPS Take 10 mg by mouth.    . Multiple Vitamin (MULTIVITAMIN) tablet Take 1 tablet by mouth daily.      . predniSONE (DELTASONE) 5 MG tablet   4  . simvastatin (ZOCOR) 20 MG tablet Take 20 mg by mouth daily after supper.  1  . valACYclovir (VALTREX) 500 MG tablet Take 500 mg by mouth 2 (two) times daily. PRN    . ZYTIGA 250 MG tablet   4   No facility-administered medications prior to visit.     PAST MEDICAL HISTORY: Past Medical History:  Diagnosis Date  . Allergy   . Anxiety   .  Arthritis   . Hepatic steatosis 2016  . Hyperlipidemia   . Intermittent depression   . 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  Marland Kitchen Neoplasm of face    benign  . Pneumonia    left lung with a loculated effusion   . Prostate cancer ([place])   . Recurrent HSV (herpes simplex virus)   . Sleep apnea   . Ulcerative colitis     PAST SURGICAL HISTORY: Past Surgical History:  Procedure Laterality Date  . DECORTICATION Left 09/06/2016   Procedure: DECORTICATION;  Surgeon: [name], MD;  Location: [place];  Service: Thoracic;  Laterality: Left;  . INGUINAL HERNIA REPAIR    . PROSTATECTOMY  04/2015  . SHOULDER ARTHROTOMY Right   . VIDEO ASSISTED THORACOSCOPY (VATS)/EMPYEMA Left 09/06/2016   Procedure: VIDEO ASSISTED THORACOSCOPY (VATS)/DRAIN EMPYEMA;  Surgeon: [name], MD;  Location: [place];  Service: Thoracic;  Laterality: Left;  Marland Kitchen VIDEO BRONCHOSCOPY N/A 09/06/2016   Procedure: VIDEO BRONCHOSCOPY;  Surgeon: [name], MD;  Location: [facility] OR;  Service: Thoracic;  Laterality: N/A;    FAMILY HISTORY: Family History  Problem Relation Age of Onset  . Heart attack Father   . Hypertension Father   . Hyperlipidemia Unknown   . Diabetes Unknown   . Thyroid disease Unknown   . Lung cancer Unknown   . Breast cancer Unknown   . Colon cancer Unknown   . Juvenile idiopathic arthritis Daughter   . Raynaud syndrome Daughter   . Breast cancer Mother     SOCIAL HISTORY: Social History   Socioeconomic History  . Marital status: Legally Separated    Spouse name: Not on file  . Number of children: 3  . Years of education: Not on file  . Highest education level: Not on file  Occupational History  . Occupation: Charity fundraiser  Social Needs  . Financial resource strain: Not on file  . Food insecurity:    Worry: Not on file    Inability: Not on file  . Transportation needs:    Medical: Not on file    Non-medical: Not on file  Tobacco Use  . Smoking status: Former Research scientist (life sciences)  . Smokeless tobacco: Never Used  . Tobacco comment: quit 1992  Substance and Sexual Activity  . Alcohol use: Yes    Alcohol/week: 0.0 standard drinks  . Drug use: No  . Sexual activity: Not on file  Lifestyle  . Physical activity:    Days per week: Not on file    Minutes per session: Not on file  . Stress: Not on file  Relationships  . Social connections:    Talks on phone: Not on file    Gets together: Not on file    Attends religious service: Not on file    Active member of club or organization: Not on file    Attends meetings of clubs or organizations: Not on file    Relationship status: Not on file  . Intimate partner violence:    Fear of current or ex partner: Not on file    Emotionally abused: Not on file    Physically abused: Not on file     Forced sexual activity: Not on file  Other Topics Concern  . Not on file  Social History Narrative   [facility] (09/04/16):   Currently he owns a flooring company. Questionable asbestos exposure. No mold exposure. Originally from [place]. Does have a dog but no bird exposure.  PHYSICAL EXAM  There were no vitals filed for this visit. There is no height or weight on file to calculate BMI.  Generalized: Well developed, in no acute distress   Neurological examination  Mentation: Alert oriented to time, place, history taking. Follows all commands speech and language fluent Cranial nerve II-XII:  Extraocular movements were full, visual field were full on confrontational test. Facial sensation and strength were normal. Uvula tongue midline. Head turning and shoulder shrug  were normal and symmetric. Motor: The motor testing reveals 5 over 5 strength of all 4 extremities. Good symmetric motor tone is noted throughout.  Neck circumference 16 inches, Mallampati 4+ Sensory: Sensory testing is intact to soft touch on all 4 extremities. No evidence of extinction is noted.  Coordination: Cerebellar testing reveals good finger-nose-finger and heel-to-shin bilaterally.  Gait and station: Gait is normal.   DIAGNOSTIC DATA (LABS, IMAGING, TESTING) - I reviewed patient records, labs, notes, testing and imaging myself where available.  Lab Results  Component Value Date   WBC 9.7 09/09/2016   HGB 8.9 (L) 09/09/2016   HCT 27.8 (L) 09/09/2016   MCV 94.2 09/09/2016   PLT 474 (H) 09/09/2016      Component Value Date/Time   NA 140 09/11/2016 0250   K 4.0 09/11/2016 0250   CL 102 09/11/2016 0250   CO2 29 09/11/2016 0250   GLUCOSE 115 (H) 09/11/2016 0250   GLUCOSE 100 (H) 05/02/2006 1201   BUN 16 09/11/2016 0250   CREATININE 0.88 09/11/2016 0250   CALCIUM 8.5 (L) 09/11/2016 0250   PROT 5.1 (L) 09/08/2016 0430   ALBUMIN 1.9 (L) 09/08/2016 0430   AST 45 (H) 09/08/2016 0430   ALT  73 (H) 09/08/2016 0430   ALKPHOS 189 (H) 09/08/2016 0430   BILITOT 0.3 09/08/2016 0430   GFRNONAA >60 09/11/2016 0250   GFRAA >60 09/11/2016 0250   Lab Results  Component Value Date   CHOL 172 10/15/2013   HDL 57.40 10/15/2013   LDLCALC 105 (H) 10/15/2013   LDLDIRECT 148.3 09/03/2011   TRIG 46.0 10/15/2013   CHOLHDL 3 10/15/2013   Lab Results  Component Value Date   HGBA1C 6.3 (H) 08/30/2006   Lab Results  Component Value Date   QASTMHDQ22 297 09/03/2016   Lab Results  Component Value Date   TSH 1.704 09/03/2016      ASSESSMENT AND PLAN 62 y.o. year old male  has a past medical history of Allergy, Anxiety, Arthritis, Hepatic steatosis (2016), Hyperlipidemia, Intermittent depression, Metastatic cancer to bone ([place]), Neoplasm of face, Pneumonia, Prostate cancer ([place]), Recurrent HSV (herpes simplex virus), Sleep apnea, and Ulcerative colitis. here with :  1.  Obstructive sleep apnea on CPAP  The patient CPAP download shows suboptimal compliance with good treatment of his apnea.  He is encouraged to use his CPAP nightly and greater than 4 hours each night.  I reviewed risk associated with untreated sleep apnea.  He voiced understanding.  Also advised that consistent use of his CPAP may also offer him some benefit with his cognition.  He voiced understanding.  He will follow-up in 1 year or sooner if needed.  I spent 15 minutes with the patient. 50% of this time was spent reviewing his CPAP download   Ward Givens, MSN, NP-C 04/09/2018, 8:54 AM [facility] [address], [place], [place] [address] [phone]  I reviewed the above note and documentation by the Nurse Practitioner and agree with the history, physical exam, assessment and plan as  outlined above. I was immediately available for face-to-face consultation. Star Age, MD, PhD [facility])

## 2018-05-06 ENCOUNTER — Other Ambulatory Visit: Payer: Self-pay | Admitting: Gastroenterology

## 2018-05-06 NOTE — Telephone Encounter (Signed)
Last seen 04-2017 Ok to refill? If so, any refills?

## 2018-05-06 NOTE — Telephone Encounter (Signed)
Jan you can give this patient a 3 month supply, hopefully he can follow up with [place] during that time. Thanks

## 2018-06-19 ENCOUNTER — Other Ambulatory Visit: Payer: Self-pay | Admitting: Thoracic Surgery (Cardiothoracic Vascular Surgery)

## 2018-06-19 DIAGNOSIS — I712 Thoracic aortic aneurysm, without rupture, unspecified: Secondary | ICD-10-CM

## 2018-07-17 ENCOUNTER — Other Ambulatory Visit: Payer: BLUE CROSS/BLUE SHIELD

## 2018-07-18 ENCOUNTER — Ambulatory Visit
Admission: RE | Admit: 2018-07-18 | Discharge: 2018-07-18 | Disposition: A | Discharge status: Home / Self Care | Payer: BLUE CROSS/BLUE SHIELD | Source: Ambulatory Visit | Attending: Thoracic Surgery (Cardiothoracic Vascular Surgery) | Admitting: Thoracic Surgery (Cardiothoracic Vascular Surgery)

## 2018-07-18 DIAGNOSIS — I712 Thoracic aortic aneurysm, without rupture, unspecified: Secondary | ICD-10-CM

## 2018-07-18 MED ORDER — IOPAMIDOL (ISOVUE-370) INJECTION 76%
80.0000 mL | Freq: Once | INTRAVENOUS | Status: AC | PRN
  Administered 2018-07-18: 80 mL via INTRAVENOUS

## 2018-07-22 ENCOUNTER — Other Ambulatory Visit: Payer: Self-pay

## 2018-07-22 ENCOUNTER — Ambulatory Visit (INDEPENDENT_AMBULATORY_CARE_PROVIDER_SITE_OTHER): Payer: BLUE CROSS/BLUE SHIELD | Admitting: Thoracic Surgery (Cardiothoracic Vascular Surgery)

## 2018-07-22 ENCOUNTER — Encounter: Payer: Self-pay | Admitting: Thoracic Surgery (Cardiothoracic Vascular Surgery)

## 2018-07-22 VITALS — BP 101/72 | HR 76 | Resp 16 | Ht 67.0 in | Wt 185.0 lb

## 2018-07-22 DIAGNOSIS — I712 Thoracic aortic aneurysm, without rupture: Secondary | ICD-10-CM

## 2018-07-22 DIAGNOSIS — I7121 Aneurysm of the ascending aorta, without rupture: Secondary | ICD-10-CM

## 2018-07-22 NOTE — Progress Notes (Signed)
[address]       [place] [address]             [phone]     HPI: [name] returns for follow-up of his ascending aneurysm  [name] is a 62 year old gentleman with a past medical history of metastatic prostate cancer, ulcerative colitis, sleep apnea, hyperlipidemia, and arthritis who presented in 2018 with pneumonia and a rapidly progressive empyema.  I did a VATS to drain the empyema and decorticate the lung in March 2018.  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  We have been following him for his aneurysm since then.  I last saw him a year ago. At that time he was doing well with the exception of some upper respiratory congestion.  His aneurysm is unchanged.  In the interim since his last visit he has been doing well.  His prostate cancer is under control.  He continues to work full-time.  He has not had any respiratory issues.  He denies chest pain, pressure, or tightness.  Past Medical History:  Diagnosis Date  . Allergy   . Anxiety   . Arthritis   . Hepatic steatosis 2016  . Hyperlipidemia   . Intermittent depression   . Metastatic cancer to bone ([place])    left rib.  Marland Kitchen Neoplasm of face    benign  . Pneumonia    left lung with a loculated effusion   . Prostate cancer ([place])   . Recurrent HSV (herpes simplex virus)   . Sleep apnea   . Ulcerative colitis     Current Outpatient Medications  Medication Sig Dispense Refill  . amitriptyline (ELAVIL) 10 MG tablet Take 10 mg by mouth at bedtime.    . balsalazide (COLAZAL) 750 MG capsule TAKE 1 CAPSULE(750 MG) BY MOUTH TWICE DAILY 60 capsule 2  . celecoxib (CELEBREX) 200 MG capsule Take 200 mg by mouth daily.    . Cholecalciferol (VITAMIN D) 2000 UNITS tablet Take 4,000 Units by mouth daily.    . diphenhydrAMINE (BENADRYL) 25 MG tablet Take 25 mg by mouth as needed.    . fish oil-omega-3 fatty acids 1000 MG capsule Take 2 g  by mouth daily.      Marland Kitchen glucosamine-chondroitin 500-400 MG tablet Take 1 tablet by mouth 3 (three) times daily.      . Lycopene 10 MG CAPS Take 10 mg by mouth.    . Multiple Vitamin (MULTIVITAMIN) tablet Take 1 tablet by mouth daily.      . predniSONE (DELTASONE) 5 MG tablet   4  . simvastatin (ZOCOR) 20 MG tablet Take 20 mg by mouth daily after supper.  1  . valACYclovir (VALTREX) 500 MG tablet Take 500 mg by mouth 2 (two) times daily as needed. PRN     . ZYTIGA 250 MG tablet 1,000 mg daily.   4   No current facility-administered medications for this visit.     Physical Exam BP 101/72 (BP Location: Left Arm, Patient Position: Sitting, Cuff Size: Large)   Pulse 76   Resp 16   Ht 5\' 7"  (1.702 m)   Wt 185 lb (83.9 kg)   SpO2 94% Comment: ON RA  BMI 28.19 kg/m  63 year old man in no acute distress Alert and oriented x3 with no focal deficits Lungs clear with equal breath sounds bilaterally Cardiac regular rate and rhythm normal S1 and S2 Pulses  2+ and symmetric  Diagnostic Tests: CT ANGIOGRAPHY CHEST WITH CONTRAST  TECHNIQUE: Multidetector CT imaging of the chest was performed using the standard protocol during bolus administration of intravenous contrast. Multiplanar CT image reconstructions and MIPs were obtained to evaluate the vascular anatomy.  CONTRAST:  75mL ISOVUE-370 IOPAMIDOL (ISOVUE-370) INJECTION 76%  COMPARISON:  07/23/2017 and prior studies.  FINDINGS: Cardiovascular: Caliber of the thoracic aorta appears stable. The aortic root measures approximately 4.1 cm at the level of the sinuses of Valsalva. There is mild calcification involving the central aspect of the aortic valve. The ascending thoracic aorta measures 4.1-4.2 cm in greatest caliber. The proximal arch measures 3.4 cm, the distal arch 3.0 cm and the descending thoracic aorta 2.6 cm. No evidence of aortic dissection. No significant atherosclerotic plaque identified. Proximal great vessels continue  to show bovine branching anatomy and normal patency.  The heart size is stable and normal. No pericardial fluid identified. No significant calcified coronary artery plaque identified. Central pulmonary arteries are normal in caliber.  Mediastinum/Nodes: No enlarged mediastinal, hilar, or axillary lymph nodes. Thyroid gland, trachea, and esophagus demonstrate no significant findings.  Lungs/Pleura: There is some scarring at the left lung base. There is no evidence of pulmonary edema, consolidation, pneumothorax, nodule or pleural fluid.  Upper Abdomen: No acute abnormality.  Musculoskeletal: Stable area residual subtle sclerosis in the posterior aspect of the left sixth rib in an area of known previous metastatic prostate carcinoma. No other definite abnormal bone lesions identified. No fractures.  Review of the MIP images confirms the above findings.  IMPRESSION: 1. Stable mild aneurysmal disease of the thoracic aorta with root diameter of 4.1 cm and ascending diameter of 4.1-4.2 cm. 2. Stable residual mild sclerosis in the posterior aspect of the left 6 rib in an area of known prior metastatic prostate carcinoma.  Aortic aneurysm NOS (ICD10-I71.9).   Electronically Signed   By: Aletta Edouard M.D.   On: 07/18/2018 09:12 I personally reviewed the CT images and concur with the findings noted above.  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  Ascending aneurysm-stable at 4.1 cm.  Needs continued annual follow-up.  He is on a statin.  His blood pressure is excellent.  Elevated left hemidiaphragm-noted on initial CT when he presented with pneumonia.  That has improved significantly although it is still slightly higher than the right side.  Plan: Return in 1 year with CT Angio of  chest  [name], MD [facility] [phone]

## 2018-08-05 ENCOUNTER — Telehealth: Payer: Self-pay

## 2018-08-05 MED ORDER — BALSALAZIDE DISODIUM 750 MG PO CAPS: ORAL_CAPSULE | ORAL | 2 refills | Status: AC

## 2018-08-05 NOTE — Telephone Encounter (Signed)
Ok to refill Colazal?  Pt last seen 04-2017

## 2018-08-05 NOTE — Telephone Encounter (Signed)
Thanks Jan. Yes can refill. It may be good to schedule a routine office visit as well since I have not seen him in over a year. Thanks

## 2018-08-05 NOTE — Telephone Encounter (Signed)
Letter sent to pt to schedule an OV. Refill sent to [facility]

## 2018-08-05 NOTE — Progress Notes (Signed)
Sent letter to patient asking him to schedule an OV with [facility].

## 2018-08-08 IMAGING — CT CT CHEST W/O CM
2 of 4 series · 15 of 36 positions shown, 18 images · non-contrast
Comparison: Radiograph of same day.  CT scan September 01, 2016.

CLINICAL DATA: Chest pain, shortness of breath.

EXAM:
CT CHEST WITHOUT CONTRAST
TECHNIQUE: Multidetector CT imaging of the chest was performed following the
standard protocol without IV contrast.

[Series 3: thorax 2.0 · axial · 0.80mm/px · z∈[+1195,+1427]mm · 12 of 130 slices shown, 15 images]
[im 7/130  mediastinal]
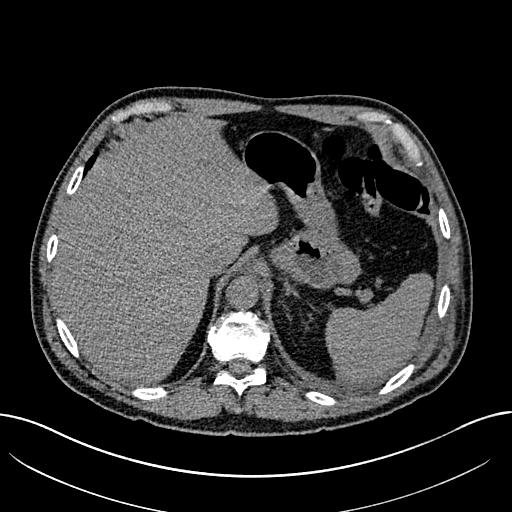
[im 7/130  lung]
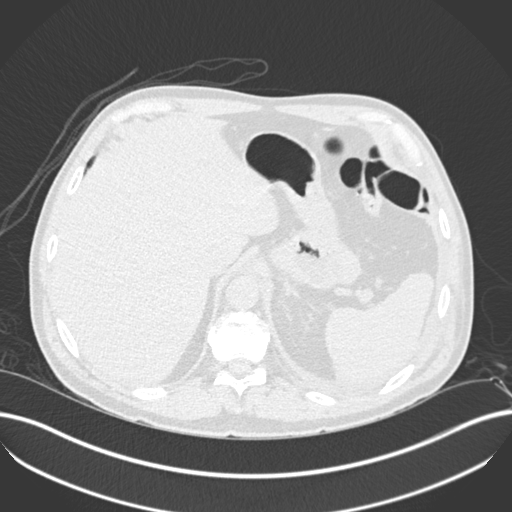
[im 21/130  lung]
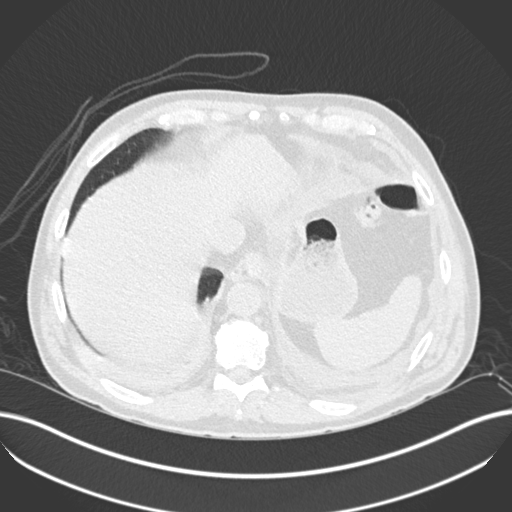
[im 28/130  lung]
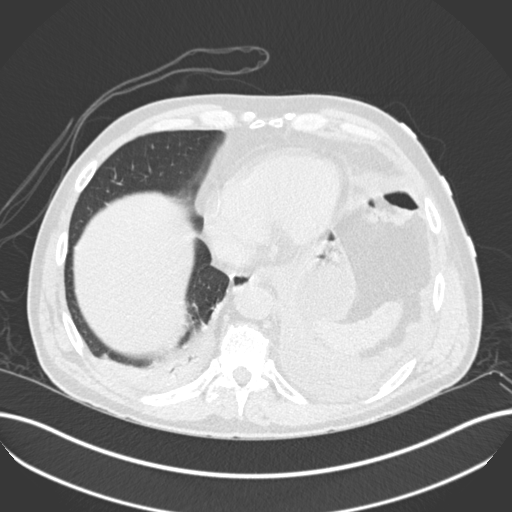
[im 41/130  lung]
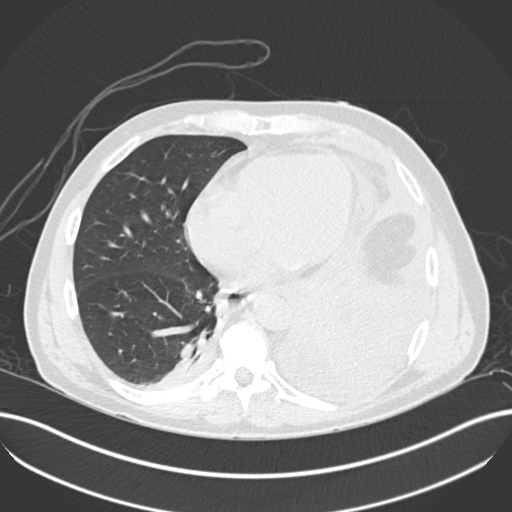
[im 48/130  mediastinal]
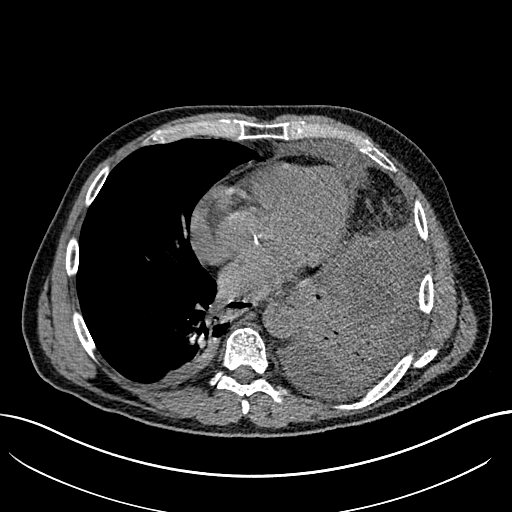
[im 48/130  lung]
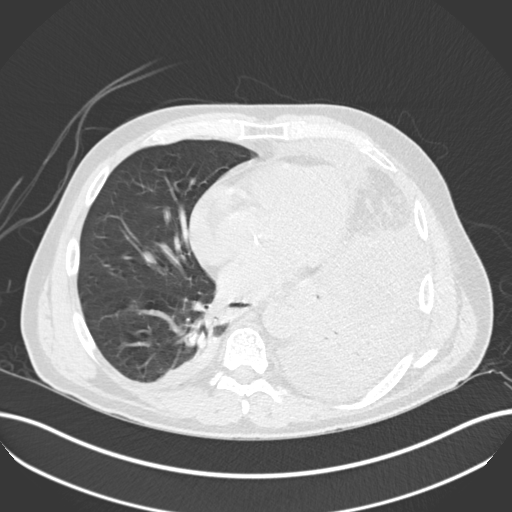
[im 62/130  lung]
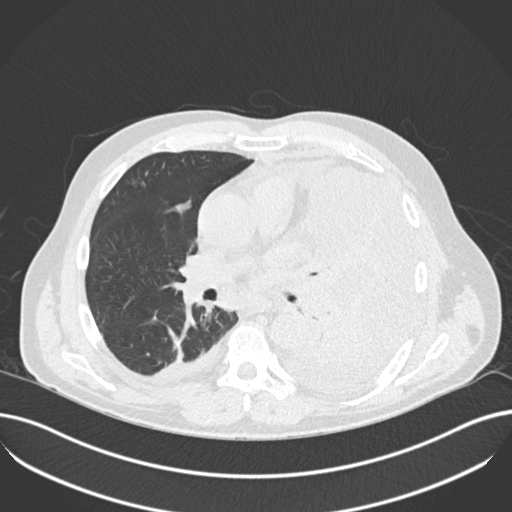
[im 68/130  lung]
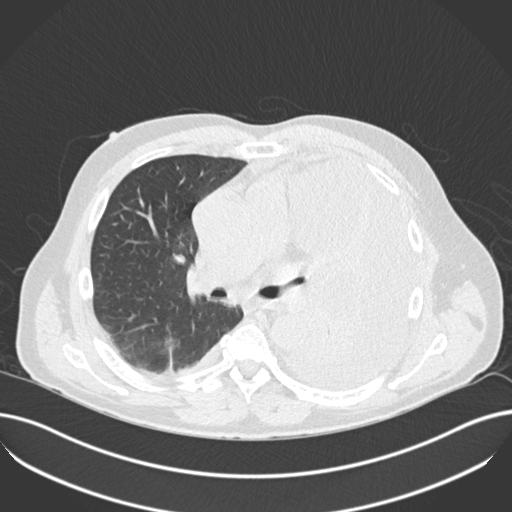
[im 82/130  lung]
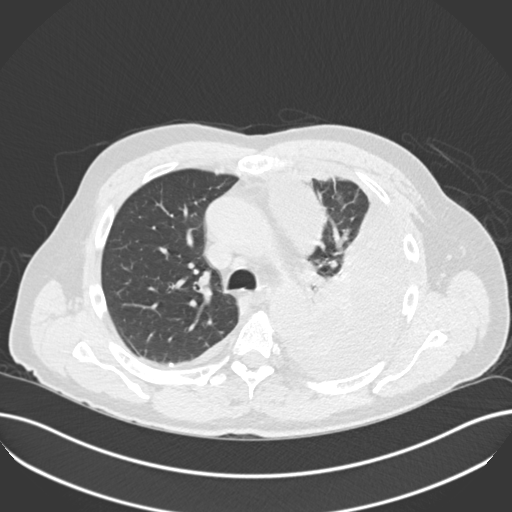
[im 89/130  mediastinal]
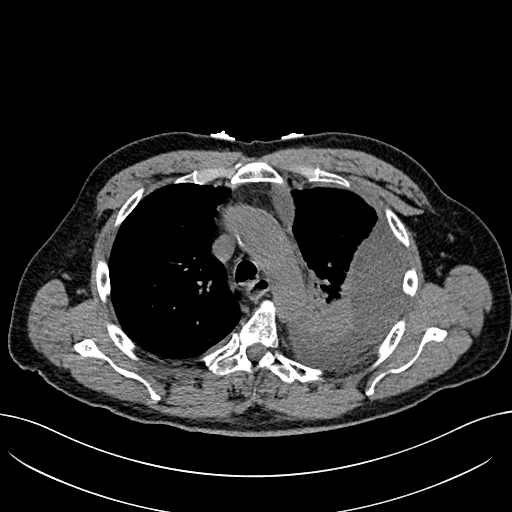
[im 89/130  lung]
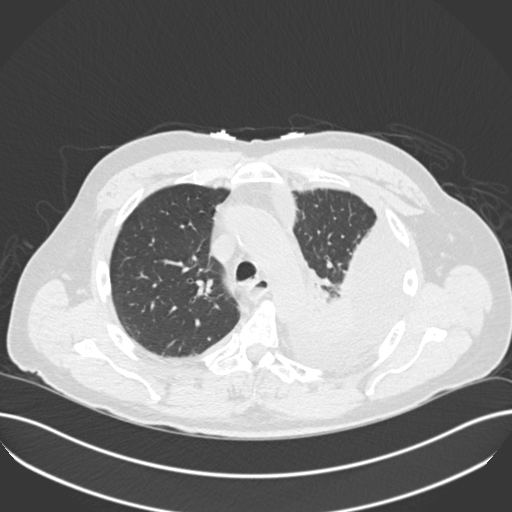
[im 102/130  lung]
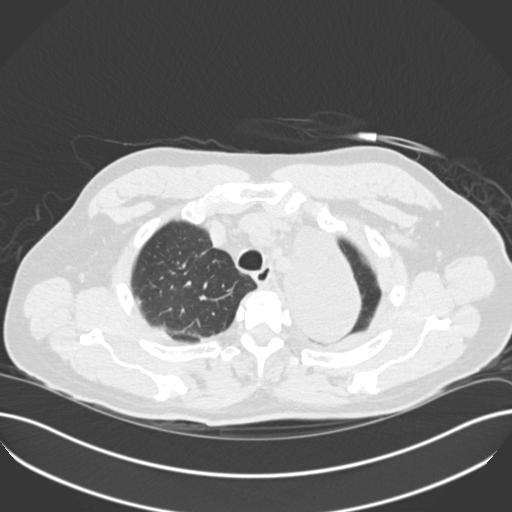
[im 109/130  lung]
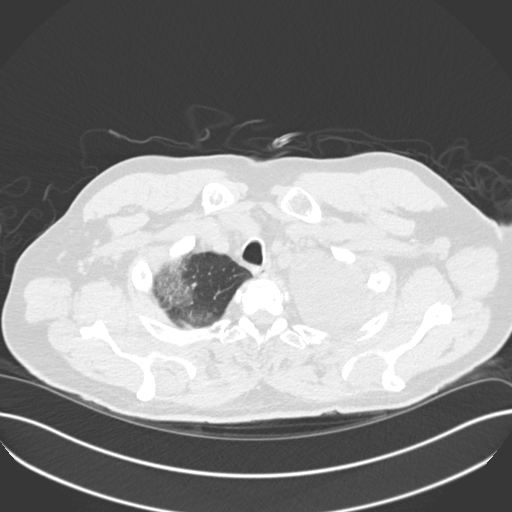
[im 123/130  lung]
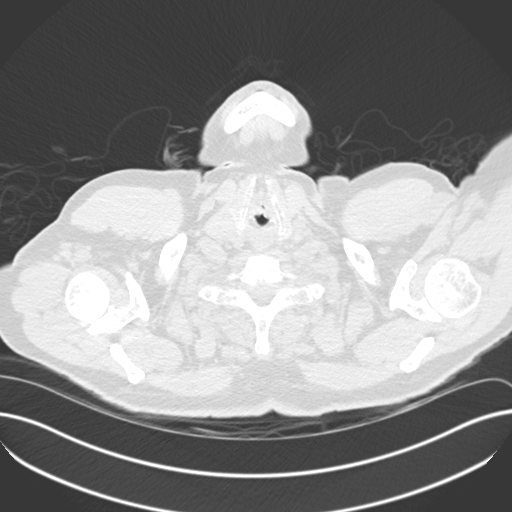

[Series 5: coronal · coronal · 0.52mm/px · 3 of 93 slices shown]
[im 19/93  lung]
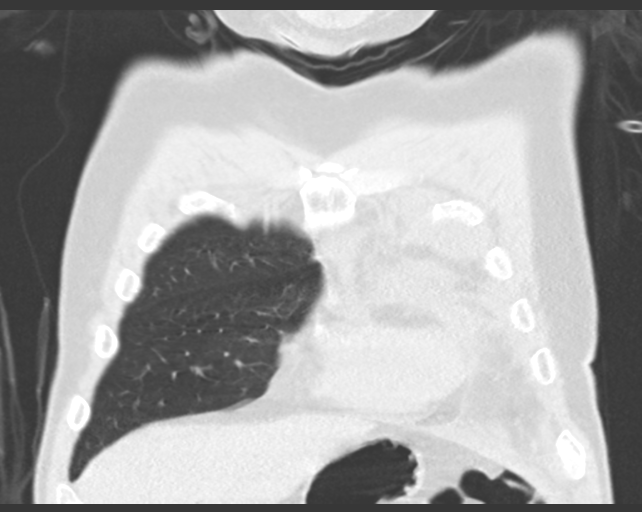
[im 37/93  lung]
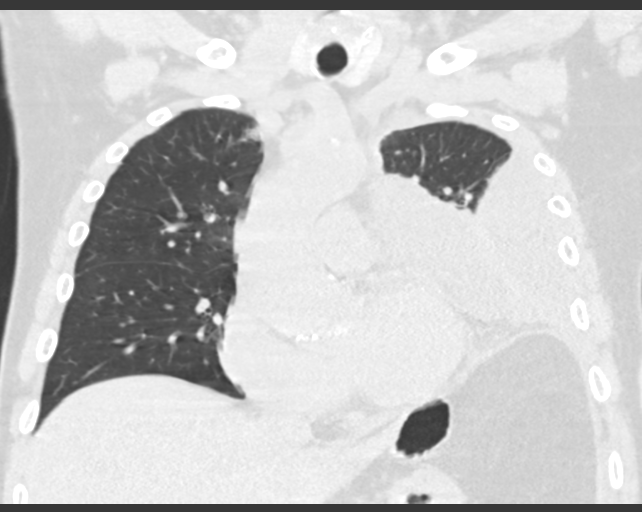
[im 56/93  lung]
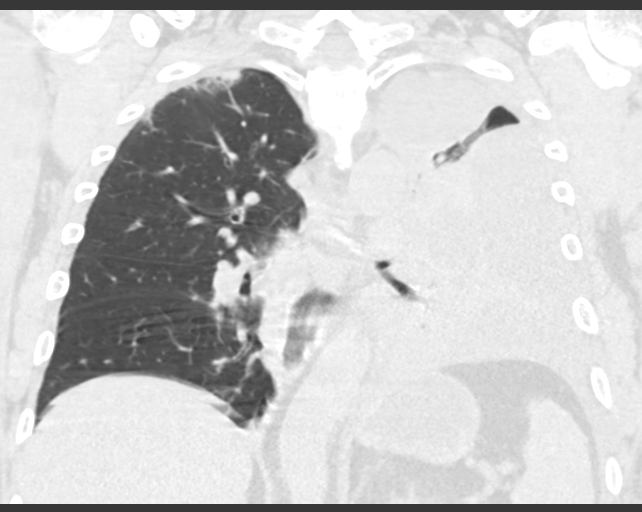

[15 of 36 positions shown; findings below may reference images not displayed]

FINDINGS: Cardiovascular: 4.1 cm ascending thoracic aortic aneurysm is noted.
Atherosclerosis is noted.

Mediastinum/Nodes: No enlarged mediastinal or axillary lymph nodes.
Thyroid gland, trachea, and esophagus demonstrate no significant
findings.

Lungs/Pleura: There is interval development of large multiloculated
pleural effusion in the left hemithorax. Atelectasis of left lower
lobe is noted. Mild right posterior basilar subsegmental atelectasis
is noted.

Upper Abdomen: No acute abnormality.

Musculoskeletal: No chest wall mass or suspicious bone lesions
identified.
IMPRESSION: 4.1 cm ascending thoracic aortic aneurysm. Recommend annual imaging
followup by CTA or MRA. This recommendation follows 2707
ACCF/AHA/AATS/ACR/ASA/SCA/JHON LEIDER/EARNESTINE/HALLMAN/JIM Guidelines for the
Diagnosis and Management of Patients with Thoracic Aortic Disease.
Circulation. 2707; 121: [id number].

Interval development of large multi loculated left pleural effusion.
Left lower lobe atelectasis is noted.

## 2018-08-10 IMAGING — DX DG CHEST 1V PORT
1 series · 1 of 1 positions shown · non-contrast
Comparison: Chest CT 09/04/2016 and CXR 09/04/2016

CLINICAL DATA: Status post VATS procedure. Assess for pneumothorax.

EXAM:
PORTABLE CHEST 1 VIEW

[chest ap]
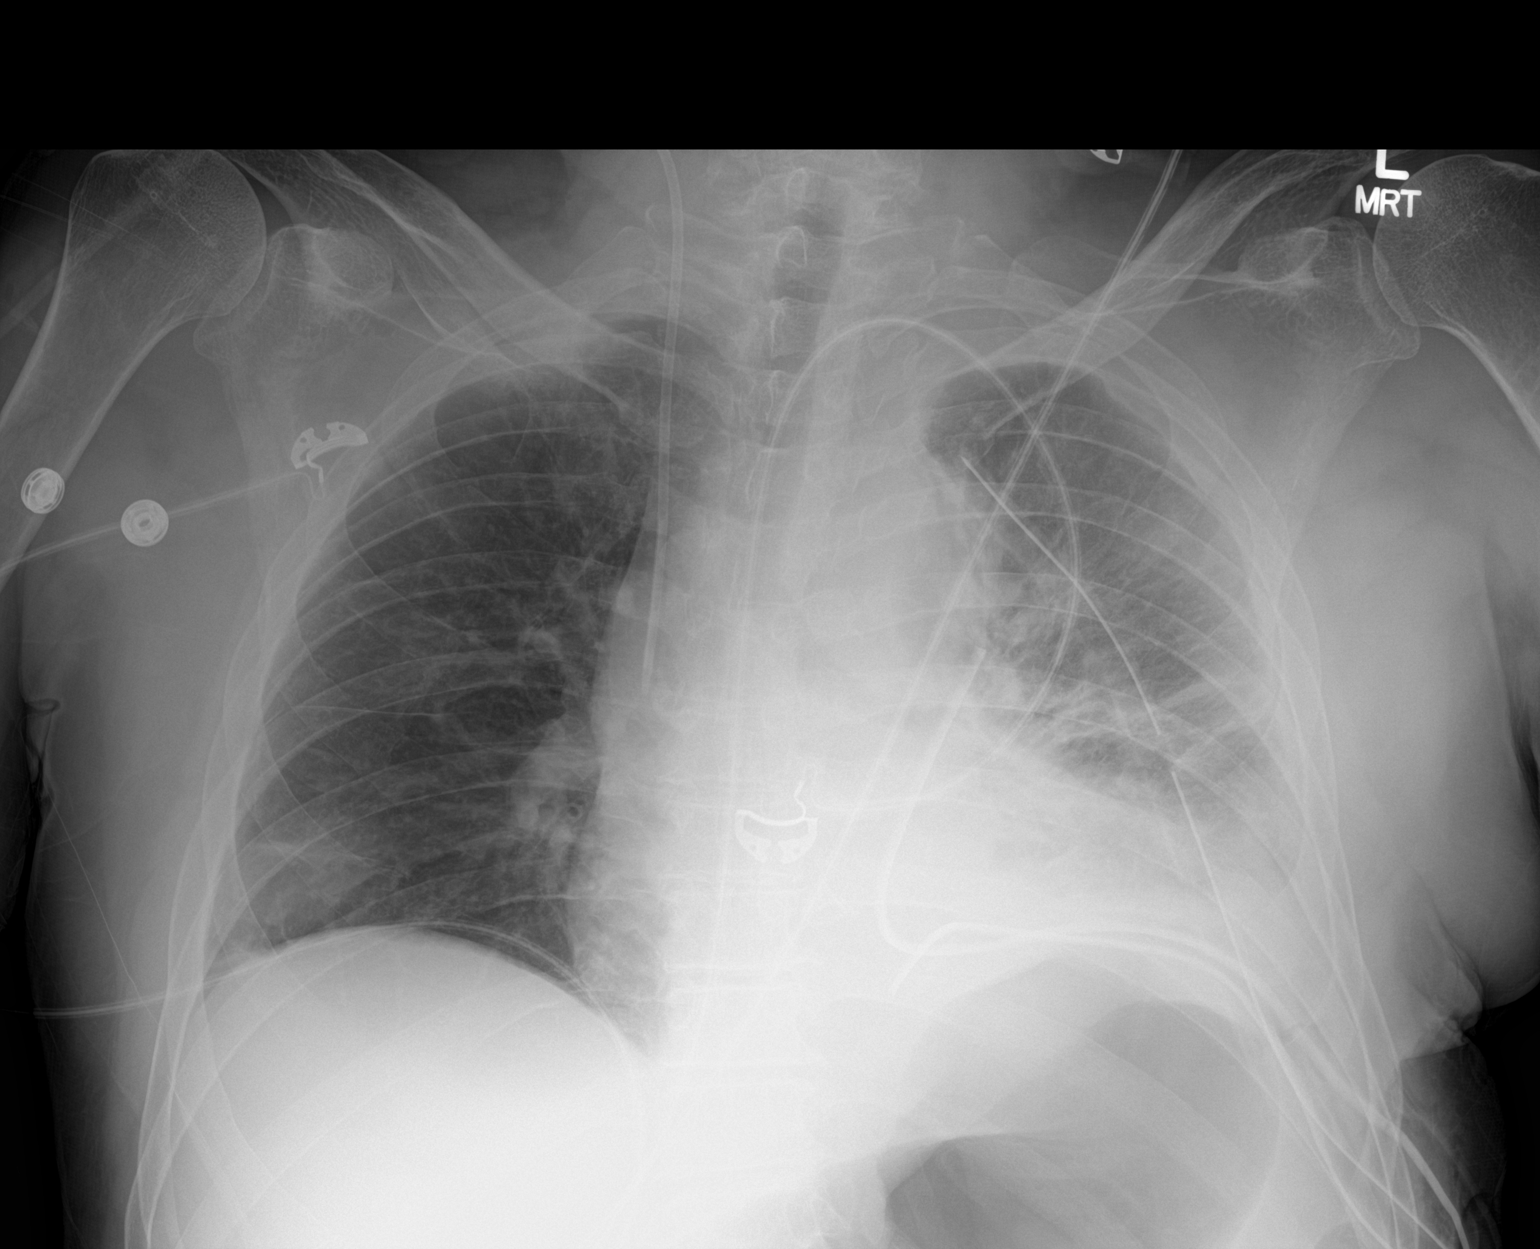

[1 of 1 positions shown; findings below may reference images not displayed]

FINDINGS: Interval marked decrease in left sided loculated pleural effusion
with drainage catheters projecting over the left lung base, along
the mediastinum to the AP window, and a larger bore chest tube with
tip projecting up to the aortic arch. No pneumothorax identified.
Small rim of residual pleural fluid is seen along the periphery of
the left lung. Right IJ central line catheter terminates in the
distal SVC. Minimal atelectasis and/or infiltrate in the right lower
lobe peripherally. No acute osseous abnormality. Heart is top-normal
in size. No aortic aneurysm.
IMPRESSION: Status post VATS procedure with marked decrease in loculated left
pleural effusion. Chest tubes are in place. No appreciable
pneumothorax.

## 2018-08-12 IMAGING — CR DG CHEST 1V PORT
1 series · 1 of 1 positions shown · non-contrast
Comparison: Chest radiograph from one day prior.

CLINICAL DATA: Empyema

EXAM:
PORTABLE CHEST 1 VIEW

[AP]
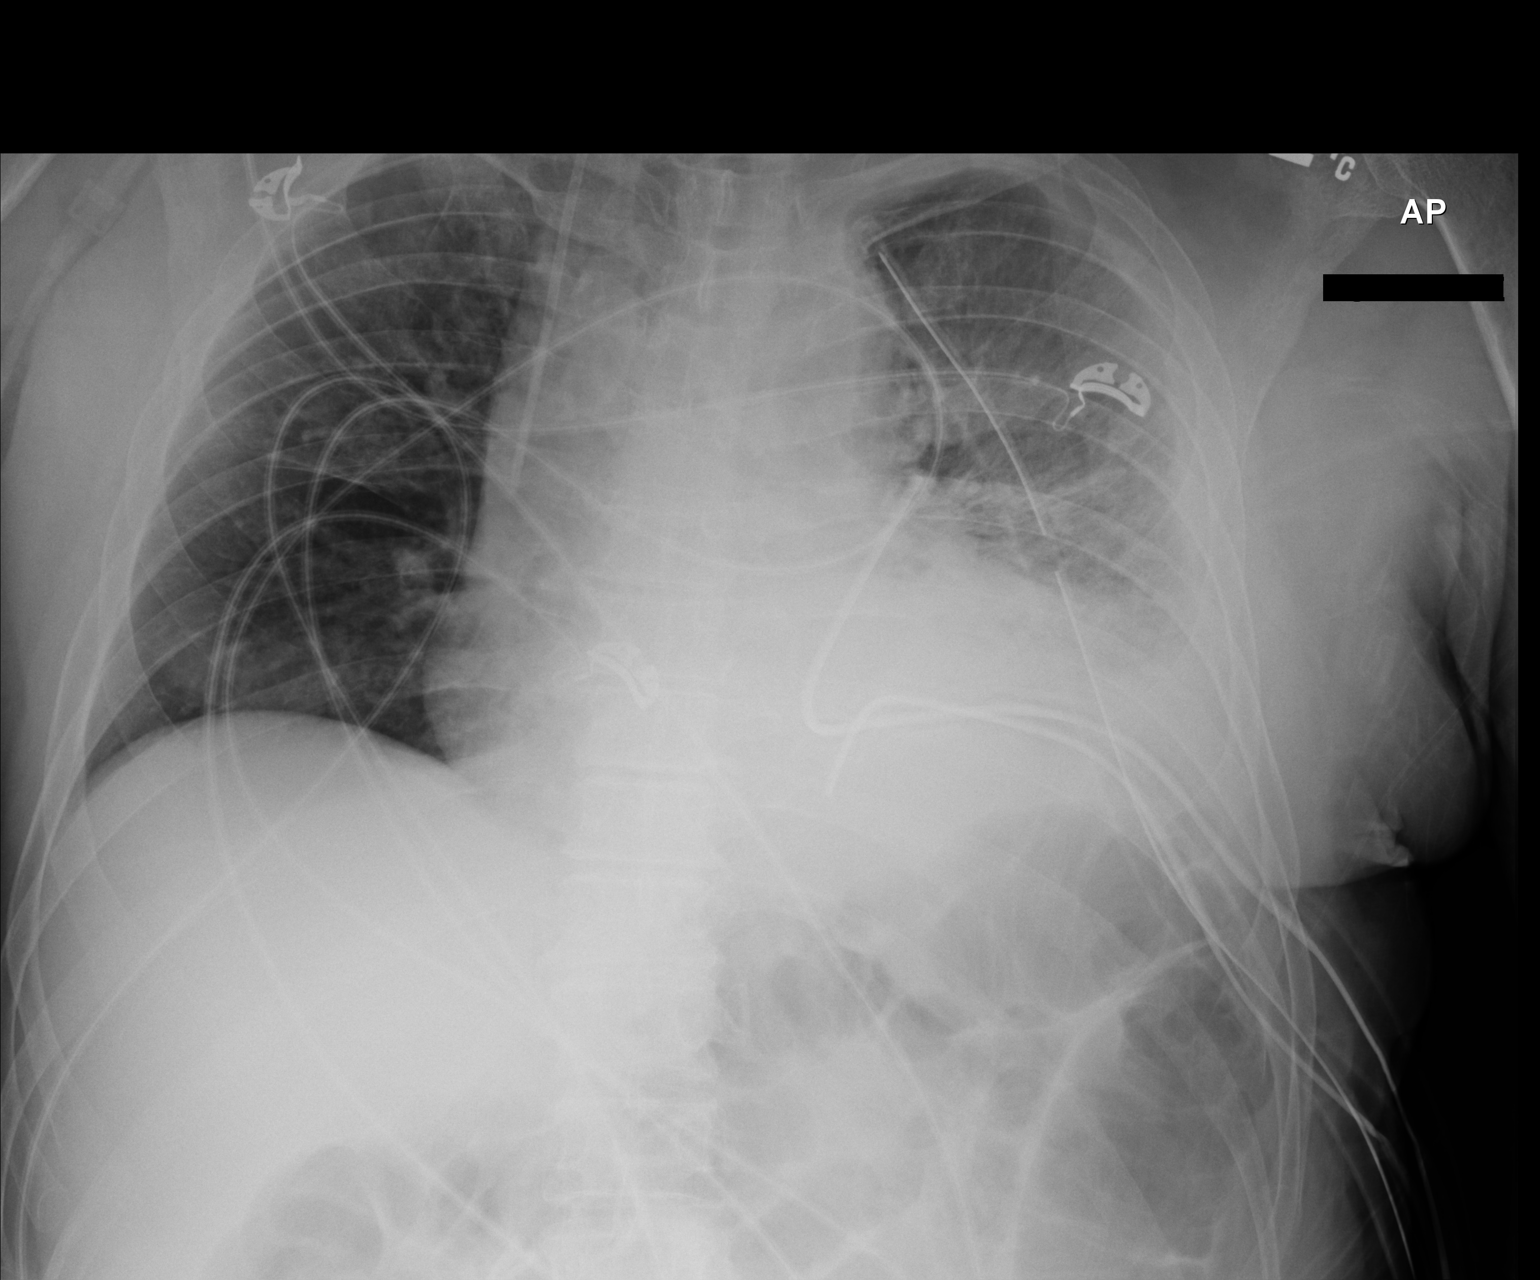

[1 of 1 positions shown; findings below may reference images not displayed]

FINDINGS: Right internal jugular central venous catheter terminates in the
lower third of the superior vena cava. Stable positions of 3 left
upper chest tubes. Stable cardiomediastinal silhouette with
top-normal heart size. No pneumothorax. Stable blunting of the left
costophrenic angle. Stable diffuse left pleural thickening. No right
pleural effusion. Stable volume loss and patchy opacity throughout
the parahilar and basilar left lung. No new consolidative airspace
disease.
IMPRESSION: 1. No pneumothorax.  Stable left chest tube positions.
2. Stable volume loss and patchy opacity throughout the parahilar
and basilar left lung.
3. Stable diffuse left pleural thickening.

## 2019-04-13 ENCOUNTER — Encounter: Payer: Self-pay | Admitting: Family Medicine

## 2019-04-13 ENCOUNTER — Other Ambulatory Visit: Payer: Self-pay

## 2019-04-13 ENCOUNTER — Ambulatory Visit: Payer: BLUE CROSS/BLUE SHIELD | Admitting: Adult Health

## 2019-04-13 ENCOUNTER — Ambulatory Visit (INDEPENDENT_AMBULATORY_CARE_PROVIDER_SITE_OTHER): Payer: BC Managed Care – PPO | Admitting: Family Medicine

## 2019-04-13 VITALS — BP 113/75 | HR 69 | Temp 97.3°F | Ht 67.0 in | Wt 185.0 lb

## 2019-04-13 DIAGNOSIS — G4733 Obstructive sleep apnea (adult) (pediatric): Secondary | ICD-10-CM | POA: Diagnosis not present

## 2019-04-13 NOTE — Progress Notes (Addendum)
-    PATIENT: [name] DOB: Jan 10, 1956  REASON FOR VISIT: follow up HISTORY FROM: patient  Chief Complaint  Patient presents with  . Follow-up    Yearly f/u. Alone. Rm 8. No new concerns at this time.      HISTORY OF PRESENT ILLNESS: Today 04/13/19 [name] is a 63 y.o. male here today for follow up for OSA on CPAP.  He is doing very well on CPAP therapy.  He recently received his new CPAP machine and reports that he cannot sleep without it.  He is very happy with his new machine.  Compliance report dated 03/10/2019 3930 2020 reveals that he is using CPAP every night for compliance 100%.  29 of the last 39 CPAP greater than 4 hours for compliance of 97%.  Average usage was 7 hours and 5 minutes.  AHI was 2.4 on 5 to 13 cm of water and an EPR of 3.  There was no significant leak noted.  HISTORY: (copied from [place] note on 04/09/2018)  [name] is a 63 year old male with a history of obstructive sleep apnea on CPAP.  He returns today for follow-up.  His CPAP download indicates that he uses machine 21 out of 30 days for compliance of 70%.  He uses machine greater than 4 hours 17 days for compliance of 57%.  On average he uses his machine 5 hours and 44 minutes.  His residual AHI is 2.4 on 5 to 13 cm of water with EPR of 3.  He does not have a significant leak.  He states that there is some nights that he is exhausted and just simply does not put the machine on.  His Epworth sleepiness score is 8.  He is being followed at [facility] for prostate cancer.  He notes that since he started treatment he is noticed some changes in his memory.  According to their note they have refer the patient to neuropsychology for evaluation.  He returns today for evaluation.   HISTORY 04/09/17:[name] a48year old male with history of obstructive sleep Apneaon CPAP. He returns today for a compliance download. His download shows that heuses his machine30/30 days for a complianceof  100%.He used his machine >4 hours 30/30 nights. On average he uses his machineregularlyevery night.He currently is using ResMed AirFit10 mediummask.Patient reports that he changes out his supplies approximately every 6 months. Overall patient feels that CPAP has improved his sleepiness and fatigue. Since the last visit the patient denies any new symptoms.  REVIEW OF SYSTEMS: Out of a complete 14 system review of symptoms, the patient complains only of the following symptoms, fatigue, hearing loss, cough, cold intolerance, restless legs, apnea, snoring, incontinence of bladder and all other reviewed systems are negative.  Epworth sleepiness scale: 5 Fatigue severity scale: 36  ALLERGIES: Allergies  Allergen Reactions  . Sulfamethoxazole     REACTION: unspecified    HOME MEDICATIONS: Outpatient Medications Prior to Visit  Medication Sig Dispense Refill  . amitriptyline (ELAVIL) 10 MG tablet Take 10 mg by mouth at bedtime.    . balsalazide (COLAZAL) 750 MG capsule TAKE 1 CAPSULE(750 MG) BY MOUTH TWICE DAILY 60 capsule 2  . celecoxib (CELEBREX) 200 MG capsule Take 200 mg by mouth daily.    . Cholecalciferol (VITAMIN D) 2000 UNITS tablet Take 4,000 Units by mouth daily.    . diphenhydrAMINE (BENADRYL) 25 MG tablet Take 25 mg by mouth as needed.    . fish oil-omega-3 fatty acids 1000 MG  capsule Take 2 g by mouth daily.      Marland Kitchen glucosamine-chondroitin 500-400 MG tablet Take 1 tablet by mouth 3 (three) times daily.      . Lycopene 10 MG CAPS Take 10 mg by mouth.    . Multiple Vitamin (MULTIVITAMIN) tablet Take 1 tablet by mouth daily.      . predniSONE (DELTASONE) 5 MG tablet   4  . simvastatin (ZOCOR) 20 MG tablet Take 20 mg by mouth daily after supper.  1  . valACYclovir (VALTREX) 500 MG tablet Take 500 mg by mouth 2 (two) times daily as needed. PRN     . ZYTIGA 250 MG tablet 1,000 mg daily.   4   No facility-administered medications prior to visit.     PAST MEDICAL HISTORY: Past  Medical History:  Diagnosis Date  . Allergy   . Anxiety   . Arthritis   . Hepatic steatosis 2016  . Hyperlipidemia   . Intermittent depression   . Metastatic cancer to bone ([place])    left rib.  Marland Kitchen Neoplasm of face    benign  . Pneumonia    left lung with a loculated effusion   . Prostate cancer ([place])   . Recurrent HSV (herpes simplex virus)   . Sleep apnea   . Ulcerative colitis     PAST SURGICAL HISTORY: Past Surgical History:  Procedure Laterality Date  . DECORTICATION Left 09/06/2016   Procedure: DECORTICATION;  Surgeon: [name], MD;  Location: [facility];  Service: Thoracic;  Laterality: Left;  . INGUINAL HERNIA REPAIR    . PROSTATECTOMY  04/2015  . SHOULDER ARTHROTOMY Right   . VIDEO ASSISTED THORACOSCOPY (VATS)/EMPYEMA Left 09/06/2016   Procedure: VIDEO ASSISTED THORACOSCOPY (VATS)/DRAIN EMPYEMA;  Surgeon: [name], MD;  Location: [facility];  Service: Thoracic;  Laterality: Left;  Marland Kitchen VIDEO BRONCHOSCOPY N/A 09/06/2016   Procedure: VIDEO BRONCHOSCOPY;  Surgeon: [name], MD;  Location: [facility] OR;  Service: Thoracic;  Laterality: N/A;    FAMILY HISTORY: Family History  Problem Relation Age of Onset  . Heart attack Father   . Hypertension Father   . Hyperlipidemia Other   . Diabetes Other   . Thyroid disease Other   . Lung cancer Other   . Breast cancer Other   . Colon cancer Other   . Juvenile idiopathic arthritis Daughter   . Raynaud syndrome Daughter   . Breast cancer Mother     SOCIAL HISTORY: Social History   Socioeconomic History  . Marital status: Legally Separated    Spouse name: Not on file  . Number of children: 3  . Years of education: Not on file  . Highest education level: Not on file  Occupational History  . Occupation: Charity fundraiser  Social Needs  . Financial resource strain: Not on file  . Food insecurity    Worry: Not on file    Inability: Not on file  . Transportation needs    Medical: Not on file     Non-medical: Not on file  Tobacco Use  . Smoking status: Former Research scientist (life sciences)  . Smokeless tobacco: Never Used  . Tobacco comment: quit 1992  Substance and Sexual Activity  . Alcohol use: Yes    Alcohol/week: 0.0 standard drinks  . Drug use: No  . Sexual activity: Not on file  Lifestyle  . Physical activity    Days per week: Not on file    Minutes per session: Not on file  . Stress: Not on  file  Relationships  . Social Herbalist on phone: Not on file    Gets together: Not on file    Attends religious service: Not on file    Active member of club or organization: Not on file    Attends meetings of clubs or organizations: Not on file    Relationship status: Not on file  . Intimate partner violence    Fear of current or ex partner: Not on file    Emotionally abused: Not on file    Physically abused: Not on file    Forced sexual activity: Not on file  Other Topics Concern  . Not on file  Social History Narrative    Pulmonary (09/04/16):   Currently he owns a flooring company. Questionable asbestos exposure. No mold exposure. Originally from [place]. Does have a dog but no bird exposure.      PHYSICAL EXAM  Vitals:   04/13/19 1020  BP: 113/75  Pulse: 69  Temp: (!) 97.3 F (36.3 C)  TempSrc: Oral  Weight: 185 lb (83.9 kg)  Height: 5\' 7"  (1.702 m)   Body mass index is 28.98 kg/m.  Generalized: Well developed, in no acute distress  Cardiology: normal rate and rhythm, no murmur noted Respiratory: Clear to auscultation bilaterally Mallampati: 3+  Neurological examination  Mentation: Alert oriented to time, place, history taking. Follows all commands speech and language fluent Cranial nerve II-XII: Pupils were equal round reactive to light. Extraocular movements were full, visual field were full on confrontational test. Facial sensation and strength were normal. Uvula tongue midline. Head turning and shoulder shrug  were normal and symmetric. Motor:  The motor testing reveals 5 over 5 strength of all 4 extremities. Good symmetric motor tone is noted throughout.  Sensory: Sensory testing is intact to soft touch on all 4 extremities. No evidence of extinction is noted.  Coordination: Cerebellar testing reveals good finger-nose-finger and heel-to-shin bilaterally.  Gait and station: Gait is normal.   DIAGNOSTIC DATA (LABS, IMAGING, TESTING) - I reviewed patient records, labs, notes, testing and imaging myself where available.  No flowsheet data found.   Lab Results  Component Value Date   WBC 9.7 09/09/2016   HGB 8.9 (L) 09/09/2016   HCT 27.8 (L) 09/09/2016   MCV 94.2 09/09/2016   PLT 474 (H) 09/09/2016      Component Value Date/Time   NA 140 09/11/2016 0250   K 4.0 09/11/2016 0250   CL 102 09/11/2016 0250   CO2 29 09/11/2016 0250   GLUCOSE 115 (H) 09/11/2016 0250   GLUCOSE 100 (H) 05/02/2006 1201   BUN 16 09/11/2016 0250   CREATININE 0.88 09/11/2016 0250   CALCIUM 8.5 (L) 09/11/2016 0250   PROT 5.1 (L) 09/08/2016 0430   ALBUMIN 1.9 (L) 09/08/2016 0430   AST 45 (H) 09/08/2016 0430   ALT 73 (H) 09/08/2016 0430   ALKPHOS 189 (H) 09/08/2016 0430   BILITOT 0.3 09/08/2016 0430   GFRNONAA >60 09/11/2016 0250   GFRAA >60 09/11/2016 0250   Lab Results  Component Value Date   CHOL 172 10/15/2013   HDL 57.40 10/15/2013   LDLCALC 105 (H) 10/15/2013   LDLDIRECT 148.3 09/03/2011   TRIG 46.0 10/15/2013   CHOLHDL 3 10/15/2013   Lab Results  Component Value Date   HGBA1C 6.3 (H) 08/30/2006   Lab Results  Component Value Date   [id number] 09/03/2016   Lab Results  Component Value Date   TSH 1.704 09/03/2016  ASSESSMENT AND PLAN 63 y.o. year old male  has a past medical history of Allergy, Anxiety, Arthritis, Hepatic steatosis (2016), Hyperlipidemia, Intermittent depression, Metastatic cancer to bone [facility]), Neoplasm of face, Pneumonia, Prostate cancer ([place]), Recurrent HSV (herpes simplex virus), Sleep apnea, and  Ulcerative colitis. here with     ICD-10-CM   1. OBSTRUCTIVE SLEEP APNEA  G47.33     Tobius is doing very well on CPAP therapy.  He is very happy with his new CPAP machine.  He was encouraged to continue using CPAP nightly and for greater than 4 hours each night.  He will follow-up with [place] annually, sooner if needed.  He verbalizes understanding and agreement with this plan.   No orders of the defined types were placed in this encounter.    No orders of the defined types were placed in this encounter.     I spent 15 minutes with the patient. 50% of this time was spent counseling and educating patient on plan of care and medications.    Debbora Presto, FNP-C 04/13/2019, 10:47 AM [facility] [address] [place], [place] [address] [phone]  I reviewed the above note and documentation by the Nurse Practitioner and agree with the history, exam, assessment and plan as outlined above. I was available for consultation. Star Age, MD, PhD [facility])

## 2019-04-13 NOTE — Patient Instructions (Signed)
Continue CPAP therapy nightly and for greater than 4 hours each night  Follow-up in 1 year  Sleep Apnea Sleep apnea affects breathing during sleep. It causes breathing to stop for a short time or to become shallow. It can also increase the risk of:  Heart attack.  Stroke.  Being very overweight (obese).  Diabetes.  Heart failure.  Irregular heartbeat. The goal of treatment is to help you breathe normally again. What are the causes? There are three kinds of sleep apnea:  Obstructive sleep apnea. This is caused by a blocked or collapsed airway.  Central sleep apnea. This happens when the brain does not send the right signals to the muscles that control breathing.  Mixed sleep apnea. This is a combination of obstructive and central sleep apnea. The most common cause of this condition is a collapsed or blocked airway. This can happen if:  Your throat muscles are too relaxed.  Your tongue and tonsils are too large.  You are overweight.  Your airway is too small. What increases the risk?  Being overweight.  Smoking.  Having a small airway.  Being older.  Being male.  Drinking alcohol.  Taking medicines to calm yourself (sedatives or tranquilizers).  Having family members with the condition. What are the signs or symptoms?  Trouble staying asleep.  Being sleepy or tired during the day.  Getting angry a lot.  Loud snoring.  Headaches in the morning.  Not being able to focus your mind (concentrate).  Forgetting things.  Less interest in sex.  Mood swings.  Personality changes.  Feelings of sadness (depression).  Waking up a lot during the night to pee (urinate).  Dry mouth.  Sore throat. How is this diagnosed?  Your medical history.  A physical exam.  A test that is done when you are sleeping (sleep study). The test is most often done in a sleep lab but may also be done at home. How is this treated?   Sleeping on your side.  Using a  medicine to get rid of mucus in your nose (decongestant).  Avoiding the use of alcohol, medicines to help you relax, or certain pain medicines (narcotics).  Losing weight, if needed.  Changing your diet.  Not smoking.  Using a machine to open your airway while you sleep, such as: ? An oral appliance. This is a mouthpiece that shifts your lower jaw forward. ? A CPAP device. This device blows air through a mask when you breathe out (exhale). ? An EPAP device. This has valves that you put in each nostril. ? A BPAP device. This device blows air through a mask when you breathe in (inhale) and breathe out.  Having surgery if other treatments do not work. It is important to get treatment for sleep apnea. Without treatment, it can lead to:  High blood pressure.  Coronary artery disease.  In men, not being able to have an erection (impotence).  Reduced thinking ability. Follow these instructions at home: Lifestyle  Make changes that your doctor recommends.  Eat a healthy diet.  Lose weight if needed.  Avoid alcohol, medicines to help you relax, and some pain medicines.  Do not use any products that contain nicotine or tobacco, such as cigarettes, e-cigarettes, and chewing tobacco. If you need help quitting, ask your doctor. General instructions  Take over-the-counter and prescription medicines only as told by your doctor.  If you were given a machine to use while you sleep, use it only as told by your  doctor.  If you are having surgery, make sure to tell your doctor you have sleep apnea. You may need to bring your device with you.  Keep all follow-up visits as told by your doctor. This is important. Contact a doctor if:  The machine that you were given to use during sleep bothers you or does not seem to be working.  You do not get better.  You get worse. Get help right away if:  Your chest hurts.  You have trouble breathing in enough air.  You have an uncomfortable  feeling in your back, arms, or stomach.  You have trouble talking.  One side of your body feels weak.  A part of your face is hanging down. These symptoms may be an emergency. Do not wait to see if the symptoms will go away. Get medical help right away. Call your local emergency services (911 in the [place] Do not drive yourself to the hospital. Summary  This condition affects breathing during sleep.  The most common cause is a collapsed or blocked airway.  The goal of treatment is to help you breathe normally while you sleep. This information is not intended to replace advice given to you by your health care provider. Make sure you discuss any questions you have with your health care provider. Document Released: 04/03/2008 Document Revised: 04/11/2018 Document Reviewed: 02/18/2018 Elsevier Patient Education  2020 [employer].

## 2019-06-01 ENCOUNTER — Other Ambulatory Visit: Payer: Self-pay

## 2019-06-01 DIAGNOSIS — Z20822 Contact with and (suspected) exposure to covid-19: Secondary | ICD-10-CM

## 2019-06-03 LAB — NOVEL CORONAVIRUS, NAA: SARS-CoV-2, NAA: NOT DETECTED

## 2019-06-25 ENCOUNTER — Other Ambulatory Visit: Payer: Self-pay | Admitting: Thoracic Surgery (Cardiothoracic Vascular Surgery)

## 2019-06-25 DIAGNOSIS — I712 Thoracic aortic aneurysm, without rupture, unspecified: Secondary | ICD-10-CM

## 2019-07-23 ENCOUNTER — Ambulatory Visit
Admission: RE | Admit: 2019-07-23 | Discharge: 2019-07-23 | Disposition: A | Discharge status: Home / Self Care | Payer: BLUE CROSS/BLUE SHIELD | Source: Ambulatory Visit | Attending: Thoracic Surgery (Cardiothoracic Vascular Surgery) | Admitting: Thoracic Surgery (Cardiothoracic Vascular Surgery)

## 2019-07-23 DIAGNOSIS — I712 Thoracic aortic aneurysm, without rupture, unspecified: Secondary | ICD-10-CM

## 2019-07-23 MED ORDER — IOPAMIDOL (ISOVUE-370) INJECTION 76%
75.0000 mL | Freq: Once | INTRAVENOUS | Status: AC | PRN
  Administered 2019-07-23: 75 mL via INTRAVENOUS

## 2019-07-28 ENCOUNTER — Other Ambulatory Visit: Payer: Self-pay

## 2019-07-28 ENCOUNTER — Encounter: Payer: Self-pay | Admitting: Thoracic Surgery (Cardiothoracic Vascular Surgery)

## 2019-07-28 ENCOUNTER — Ambulatory Visit (INDEPENDENT_AMBULATORY_CARE_PROVIDER_SITE_OTHER): Payer: BC Managed Care – PPO | Admitting: Thoracic Surgery (Cardiothoracic Vascular Surgery)

## 2019-07-28 VITALS — BP 109/70 | HR 88 | Temp 97.6°F | Resp 20 | Ht 67.0 in | Wt 185.0 lb

## 2019-07-28 DIAGNOSIS — I7121 Aneurysm of the ascending aorta, without rupture: Secondary | ICD-10-CM

## 2019-07-28 DIAGNOSIS — I712 Thoracic aortic aneurysm, without rupture: Secondary | ICD-10-CM | POA: Diagnosis not present

## 2019-07-28 NOTE — Progress Notes (Signed)
[address]       [place] [address]             [phone]     HPI: [name] returns for scheduled annual follow-up visit  [name] is a 64 year old man with a history of stage IV prostate cancer, ulcerative colitis, sleep apnea, hyperlipidemia, degenerative joint disease, left empyema and an ascending aortic aneurysm.  I did a left VATS for drainage of an empyema and decortication in March 2018.  On his preoperative film he was noted to have a 4.1 cm ascending aneurysm and mild elevation of his left hemidiaphragm.  I have followed him since then.  I saw him in the office a year ago.  He was doing well at that time.  His aneurysm was unchanged.  He continues to do well.  He still working full-time.  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  He has not had any recent issues with his prostate cancer.  He denies any chest pain, pressure, or tightness.  Past Medical History:  Diagnosis Date  . Allergy   . Anxiety   . Arthritis   . Hepatic steatosis 2016  . Hyperlipidemia   . Intermittent depression   . Metastatic cancer to bone ([place])    left rib.  Marland Kitchen Neoplasm of face    benign  . Pneumonia    left lung with a loculated effusion   . Prostate cancer ([place])   . Recurrent HSV (herpes simplex virus)   . Sleep apnea   . Ulcerative colitis      Current Outpatient Medications  Medication Sig Dispense Refill  . amitriptyline (ELAVIL) 10 MG tablet Take 10 mg by mouth at bedtime.    . balsalazide (COLAZAL) 750 MG capsule TAKE 1 CAPSULE(750 MG) BY MOUTH TWICE DAILY 60 capsule 2  . celecoxib (CELEBREX) 200 MG capsule Take 200 mg by mouth daily.    . Cholecalciferol (VITAMIN D) 2000 UNITS tablet Take 4,000 Units by mouth daily.    . diphenhydrAMINE (BENADRYL) 25 MG tablet Take 25 mg by mouth as needed.    . fish oil-omega-3 fatty acids 1000 MG capsule Take 2 g by mouth daily.      Marland Kitchen glucosamine-chondroitin 500-400 MG tablet Take 1 tablet by mouth 3  (three) times daily.      . Lycopene 10 MG CAPS Take 10 mg by mouth.    . Multiple Vitamin (MULTIVITAMIN) tablet Take 1 tablet by mouth daily.      . predniSONE (DELTASONE) 5 MG tablet   4  . simvastatin (ZOCOR) 20 MG tablet Take 20 mg by mouth daily after supper.  1  . valACYclovir (VALTREX) 500 MG tablet Take 500 mg by mouth 2 (two) times daily as needed. PRN     . ZYTIGA 250 MG tablet 1,000 mg daily.   4   No current facility-administered medications for this visit.    Physical Exam BP 109/70   Pulse 88   Temp 97.6 F (36.4 C) (Skin)   Resp 20   Ht 5\' 7"  (1.702 m)   Wt 185 lb (83.9 kg)   SpO2 96% Comment: RA  BMI 28.3 kg/m  64 year old man in no acute distress Alert and oriented x3 with no focal deficits Cardiac regular rate and rhythm with a normal S1 and S2 Lungs clear with equal breath sounds bilaterally No clubbing cyanosis or edema  Diagnostic Tests: CT ANGIOGRAPHY CHEST WITH CONTRAST  TECHNIQUE: Multidetector CT imaging of the chest was performed using the standard protocol during bolus administration of intravenous contrast. Multiplanar CT image reconstructions and MIPs were obtained to evaluate the vascular anatomy.  CONTRAST:  70mL ISOVUE-370 IOPAMIDOL (ISOVUE-370) INJECTION 76%  COMPARISON:  CT angiogram chest July 18, 2018.  FINDINGS: Cardiovascular: Ascending thoracic aortic diameter measures 4.2 x 4.2 cm, stable. The measured diameter at the sinuses of Valsalva measures 3.7 cm. Measured diameter in the aortic arch measures 3.1 cm. Measured diameter of the descending aorta at the main pulmonary outflow tract level measures 2.7 x 2.7 cm. There is no evident thoracic aortic dissection. Visualized great vessels appear unremarkable. Note that the right innominate and left common carotid arteries arise as a common trunk, an anatomic variant. There is no pericardial effusion or pericardial thickening. There is no demonstrable pulmonary  embolus.  Mediastinum/Nodes: Thyroid appears unremarkable. There is no appreciable thoracic adenopathy. No esophageal lesions are evident.  Lungs/Pleura: There are scattered areas of atelectasis. There is mild scarring in the left lung base. There is no edema or airspace opacity. No pleural effusions are demonstrable. On axial slice 74 series 7, there is a 2 mm nodular opacity in the anterior segment right upper lobe inferiorly.  Upper Abdomen: Visualized upper abdominal structures appear unremarkable.  Musculoskeletal: There is degenerative change in the thoracic spine. A small focus of sclerosis in the anterior left sixth rib is stable. No new bone lesions demonstrable. No chest wall lesions appreciable.  Review of the MIP images confirms the above findings.  IMPRESSION: 1. Stable prominence of the ascending thoracic aorta with a measured diameter 4.2 x 4.2 cm. No dissection evident. Recommend annual imaging followup by CTA or MRA. This recommendation follows 2010 ACCF/AHA/AATS/ACR/ASA/SCA/SCAI/SIR/STS/SVM Guidelines for the Diagnosis and Management of Patients with Thoracic Aortic Disease. Circulation. 2010; [id number]. Aortic aneurysm NOS (ICD10-I71.9).  2.  No evident pulmonary embolus.  3. Areas of mild scarring and atelectasis. No edema or consolidation. 2 mm nodular opacity on the right. No follow-up needed if patient is low-risk. Non-contrast chest CT can be considered in 12 months if patient is high-risk. Given history of prostate carcinoma, particular attention this area on follow-up study for the thoracic aorta advised. This recommendation follows the consensus statement: Guidelines for Management of Incidental Pulmonary Nodules Detected on CT Images: From the Fleischner Society 2017; Radiology 2017; 284:228-243.  4.  No adenopathy evident.  5. Small sclerotic focus in the anterior left sixth rib, stable. Small metastasis due to prostate carcinoma  may be present in this area.   Electronically Signed   By: Lowella Grip Huang M.D.   On: 07/23/2019 12:24 I personally reviewed the CT images and concur with the findings noted above  Impression: [name] is a 64 year old man with a history of metastatic prostate cancer, ulcerative colitis, sleep apnea, hyperlipidemia, arthritis, pneumonia, empyema, and a 4.1 cm ascending aneurysm.  Ascending aneurysm-stable.  Measured 4.2 cm.  No significant change.  Needs continued annual follow-up.  His blood pressure is normal.  Right lung nodule-2 mm subpleural nodule noted on CT.  There was a similar sized nodule on the CT from a year prior.  This most likely is a subpleural lymph node.  He'll get a follow-up scan in a year.  Elevated hemidiaphragm-unchanged from a year ago  Plan: Return in 1 year with CT angio of chest  [name], MD [facility] [phone]

## 2019-11-10 DIAGNOSIS — M1711 Unilateral primary osteoarthritis, right knee: Secondary | ICD-10-CM | POA: Diagnosis not present

## 2019-11-17 DIAGNOSIS — M1711 Unilateral primary osteoarthritis, right knee: Secondary | ICD-10-CM | POA: Diagnosis not present

## 2019-12-07 DIAGNOSIS — C61 Malignant neoplasm of prostate: Secondary | ICD-10-CM | POA: Diagnosis not present

## 2019-12-31 DIAGNOSIS — M1711 Unilateral primary osteoarthritis, right knee: Secondary | ICD-10-CM | POA: Diagnosis not present

## 2020-01-07 DIAGNOSIS — C7951 Secondary malignant neoplasm of bone: Secondary | ICD-10-CM | POA: Diagnosis not present

## 2020-01-07 DIAGNOSIS — Z79899 Other long term (current) drug therapy: Secondary | ICD-10-CM | POA: Diagnosis not present

## 2020-01-07 DIAGNOSIS — C61 Malignant neoplasm of prostate: Secondary | ICD-10-CM | POA: Diagnosis not present

## 2020-01-08 DIAGNOSIS — C61 Malignant neoplasm of prostate: Secondary | ICD-10-CM | POA: Diagnosis not present

## 2020-01-19 DIAGNOSIS — G4733 Obstructive sleep apnea (adult) (pediatric): Secondary | ICD-10-CM | POA: Diagnosis not present

## 2020-02-08 DIAGNOSIS — C61 Malignant neoplasm of prostate: Secondary | ICD-10-CM | POA: Diagnosis not present

## 2020-02-10 DIAGNOSIS — Z79818 Long term (current) use of other agents affecting estrogen receptors and estrogen levels: Secondary | ICD-10-CM | POA: Diagnosis not present

## 2020-02-10 DIAGNOSIS — R232 Flushing: Secondary | ICD-10-CM | POA: Diagnosis not present

## 2020-02-10 DIAGNOSIS — C7951 Secondary malignant neoplasm of bone: Secondary | ICD-10-CM | POA: Diagnosis not present

## 2020-02-10 DIAGNOSIS — C61 Malignant neoplasm of prostate: Secondary | ICD-10-CM | POA: Diagnosis not present

## 2020-02-10 DIAGNOSIS — Z79899 Other long term (current) drug therapy: Secondary | ICD-10-CM | POA: Diagnosis not present

## 2020-02-23 DIAGNOSIS — G4733 Obstructive sleep apnea (adult) (pediatric): Secondary | ICD-10-CM | POA: Diagnosis not present

## 2020-02-25 DIAGNOSIS — Z23 Encounter for immunization: Secondary | ICD-10-CM | POA: Diagnosis not present

## 2020-03-01 DIAGNOSIS — H40023 Open angle with borderline findings, high risk, bilateral: Secondary | ICD-10-CM | POA: Diagnosis not present

## 2020-03-10 DIAGNOSIS — C61 Malignant neoplasm of prostate: Secondary | ICD-10-CM | POA: Diagnosis not present

## 2020-03-15 DIAGNOSIS — M25561 Pain in right knee: Secondary | ICD-10-CM | POA: Diagnosis not present

## 2020-03-22 DIAGNOSIS — Z8546 Personal history of malignant neoplasm of prostate: Secondary | ICD-10-CM | POA: Diagnosis not present

## 2020-03-22 DIAGNOSIS — Z0000 Encounter for general adult medical examination without abnormal findings: Secondary | ICD-10-CM | POA: Diagnosis not present

## 2020-03-30 DIAGNOSIS — C61 Malignant neoplasm of prostate: Secondary | ICD-10-CM | POA: Diagnosis not present

## 2020-03-30 DIAGNOSIS — M150 Primary generalized (osteo)arthritis: Secondary | ICD-10-CM | POA: Diagnosis not present

## 2020-03-30 DIAGNOSIS — Z23 Encounter for immunization: Secondary | ICD-10-CM | POA: Diagnosis not present

## 2020-03-30 DIAGNOSIS — Z0000 Encounter for general adult medical examination without abnormal findings: Secondary | ICD-10-CM | POA: Diagnosis not present

## 2020-03-30 DIAGNOSIS — G4733 Obstructive sleep apnea (adult) (pediatric): Secondary | ICD-10-CM | POA: Diagnosis not present

## 2020-03-30 DIAGNOSIS — K5190 Ulcerative colitis, unspecified, without complications: Secondary | ICD-10-CM | POA: Diagnosis not present

## 2020-04-01 ENCOUNTER — Other Ambulatory Visit (HOSPITAL_COMMUNITY): Payer: Self-pay | Admitting: Geriatric Medicine

## 2020-04-01 ENCOUNTER — Other Ambulatory Visit (HOSPITAL_COMMUNITY): Payer: Self-pay | Admitting: Family Medicine

## 2020-04-01 DIAGNOSIS — Z01818 Encounter for other preprocedural examination: Secondary | ICD-10-CM | POA: Diagnosis not present

## 2020-04-01 DIAGNOSIS — R011 Cardiac murmur, unspecified: Secondary | ICD-10-CM

## 2020-04-11 DIAGNOSIS — C61 Malignant neoplasm of prostate: Secondary | ICD-10-CM | POA: Diagnosis not present

## 2020-04-12 ENCOUNTER — Ambulatory Visit (HOSPITAL_COMMUNITY): Payer: BC Managed Care – PPO | Attending: Cardiology

## 2020-04-12 ENCOUNTER — Other Ambulatory Visit: Payer: Self-pay

## 2020-04-12 DIAGNOSIS — R011 Cardiac murmur, unspecified: Secondary | ICD-10-CM | POA: Insufficient documentation

## 2020-04-12 LAB — ECHOCARDIOGRAM COMPLETE
AR max vel: 2.66 cm2
AV Area VTI: 2.91 cm2
AV Area mean vel: 2.44 cm2
AV Mean grad: 9 mmHg
AV Peak grad: 15.8 mmHg
Ao pk vel: 1.99 m/s
Area-P 1/2: 3.2 cm2
S' Lateral: 1.7 cm

## 2020-04-13 ENCOUNTER — Encounter: Payer: Self-pay | Admitting: Family Medicine

## 2020-04-13 ENCOUNTER — Ambulatory Visit (INDEPENDENT_AMBULATORY_CARE_PROVIDER_SITE_OTHER): Payer: BC Managed Care – PPO | Admitting: Family Medicine

## 2020-04-13 VITALS — BP 147/87 | HR 66 | Ht 67.0 in | Wt 185.0 lb

## 2020-04-13 DIAGNOSIS — Z9989 Dependence on other enabling machines and devices: Secondary | ICD-10-CM

## 2020-04-13 DIAGNOSIS — G4733 Obstructive sleep apnea (adult) (pediatric): Secondary | ICD-10-CM | POA: Diagnosis not present

## 2020-04-13 NOTE — Patient Instructions (Signed)
Please continue using your CPAP regularly. While your insurance requires that you use CPAP at least 4 hours each night on 70% of the nights, I recommend, that you not skip any nights and use it throughout the night if you can. Getting used to CPAP and staying with the treatment long term does take time and patience and discipline. Untreated obstructive sleep apnea when it is moderate to severe can have an adverse impact on cardiovascular health and raise her risk for heart disease, arrhythmias, hypertension, congestive heart failure, stroke and diabetes. Untreated obstructive sleep apnea causes sleep disruption, nonrestorative sleep, and sleep deprivation. This can have an impact on your day to day functioning and cause daytime sleepiness and impairment of cognitive function, memory loss, mood disturbance, and problems focussing. Using CPAP regularly can improve these symptoms.   Follow up in 1 year   Sleep Apnea Sleep apnea affects breathing during sleep. It causes breathing to stop for a short time or to become shallow. It can also increase the risk of:  Heart attack.  Stroke.  Being very overweight (obese).  Diabetes.  Heart failure.  Irregular heartbeat. The goal of treatment is to help you breathe normally again. What are the causes? There are three kinds of sleep apnea:  Obstructive sleep apnea. This is caused by a blocked or collapsed airway.  Central sleep apnea. This happens when the brain does not send the right signals to the muscles that control breathing.  Mixed sleep apnea. This is a combination of obstructive and central sleep apnea. The most common cause of this condition is a collapsed or blocked airway. This can happen if:  Your throat muscles are too relaxed.  Your tongue and tonsils are too large.  You are overweight.  Your airway is too small. What increases the risk?  Being overweight.  Smoking.  Having a small airway.  Being older.  Being  male.  Drinking alcohol.  Taking medicines to calm yourself (sedatives or tranquilizers).  Having family members with the condition. What are the signs or symptoms?  Trouble staying asleep.  Being sleepy or tired during the day.  Getting angry a lot.  Loud snoring.  Headaches in the morning.  Not being able to focus your mind (concentrate).  Forgetting things.  Less interest in sex.  Mood swings.  Personality changes.  Feelings of sadness (depression).  Waking up a lot during the night to pee (urinate).  Dry mouth.  Sore throat. How is this diagnosed?  Your medical history.  A physical exam.  A test that is done when you are sleeping (sleep study). The test is most often done in a sleep lab but may also be done at home. How is this treated?   Sleeping on your side.  Using a medicine to get rid of mucus in your nose (decongestant).  Avoiding the use of alcohol, medicines to help you relax, or certain pain medicines (narcotics).  Losing weight, if needed.  Changing your diet.  Not smoking.  Using a machine to open your airway while you sleep, such as: ? An oral appliance. This is a mouthpiece that shifts your lower jaw forward. ? A CPAP device. This device blows air through a mask when you breathe out (exhale). ? An EPAP device. This has valves that you put in each nostril. ? A BPAP device. This device blows air through a mask when you breathe in (inhale) and breathe out.  Having surgery if other treatments do not work. It is   important to get treatment for sleep apnea. Without treatment, it can lead to:  High blood pressure.  Coronary artery disease.  In men, not being able to have an erection (impotence).  Reduced thinking ability. Follow these instructions at home: Lifestyle  Make changes that your doctor recommends.  Eat a healthy diet.  Lose weight if needed.  Avoid alcohol, medicines to help you relax, and some pain  medicines.  Do not use any products that contain nicotine or tobacco, such as cigarettes, e-cigarettes, and chewing tobacco. If you need help quitting, ask your doctor. General instructions  Take over-the-counter and prescription medicines only as told by your doctor.  If you were given a machine to use while you sleep, use it only as told by your doctor.  If you are having surgery, make sure to tell your doctor you have sleep apnea. You may need to bring your device with you.  Keep all follow-up visits as told by your doctor. This is important. Contact a doctor if:  The machine that you were given to use during sleep bothers you or does not seem to be working.  You do not get better.  You get worse. Get help right away if:  Your chest hurts.  You have trouble breathing in enough air.  You have an uncomfortable feeling in your back, arms, or stomach.  You have trouble talking.  One side of your body feels weak.  A part of your face is hanging down. These symptoms may be an emergency. Do not wait to see if the symptoms will go away. Get medical help right away. Call your local emergency services (911 in the [place] Do not drive yourself to the hospital. Summary  This condition affects breathing during sleep.  The most common cause is a collapsed or blocked airway.  The goal of treatment is to help you breathe normally while you sleep. This information is not intended to replace advice given to you by your health care provider. Make sure you discuss any questions you have with your health care provider. Document Revised: 04/11/2018 Document Reviewed: 02/18/2018 Elsevier Patient Education  2020 [employer]  

## 2020-04-13 NOTE — Progress Notes (Addendum)
PATIENT: [name] DOB: July 21, 1955  REASON FOR VISIT: follow up HISTORY FROM: patient  Chief Complaint  Patient presents with  . OSA on CPAP    rm 2 one year FU "no issues"     HISTORY OF PRESENT ILLNESS: Today 04/13/20 [name] is a 64 y.o. male here today for follow up for OSA on CPAP. He is doing very well on CPAP. He uses his machine every night. He does continue to suffer from fatigue and relates this to prostate cancer treatments. He is followed closely by PCP and [facility] oncology.   Compliance Report dated 03/13/2020 through 04/11/2020 reveals that he used CPAP thirty of the past 30 days for compliance of 100%.  The CPAP greater than 4 hours twenty-seven of the past 30 days for compliance of 90%.  Average usage on days used was 6 hours and 34 minutes.  Residual AHI was 2.3 on 5 to 13 cm of water and an EPR of three.  There was no significant leak noted.  HISTORY: (copied from my note on 04/13/2019)  [name] is a 64 y.o. male here today for follow up for OSA on CPAP.  He is doing very well on CPAP therapy.  He recently received his new CPAP machine and reports that he cannot sleep without it.  He is very happy with his new machine.  Compliance report dated 03/10/2019 3930 2020 reveals that he is using CPAP every night for compliance 100%.  29 of the last 39 CPAP greater than 4 hours for compliance of 97%.  Average usage was 7 hours and 5 minutes.  AHI was 2.4 on 5 to 13 cm of water and an EPR of 3.  There was no significant leak noted.  HISTORY: (copied from [place] note on 04/09/2018)  [name] is a 64 year old male with a history of obstructive sleep apnea on CPAP. He returns today for follow-up. His CPAP download indicates that he uses machine 21 out of 30 days for compliance of 70%. He uses machine greater than 4 hours 17 days for compliance of 57%. On average he uses his machine 5 hours and 44 minutes. His residual AHI is 2.4 on 5 to 13 cm of water with  EPR of 3. He does not have a significant leak. He states that there is some nights that he is exhausted and just simply does not put the machine on. His Epworth sleepiness score is 8. He is being followed at [facility] for prostate cancer. He notes that since he started treatment he is noticed some changes in his memory. According to their note they have refer the patient to neuropsychology for evaluation. He returns today for evaluation.   HISTORY10/02/18:[name] a52year old male with history of obstructive sleep Apneaon CPAP. He returns today for a compliance download. His download shows that heuses his machine30/30 days for a complianceof 100%.He used his machine >4 hours 30/30 nights. On average he uses his machineregularlyevery night.He currently is using ResMed AirFit10 mediummask.Patient reports that he changes out his supplies approximately every 6 months. Overall patient feels that CPAP has improved his sleepiness and fatigue. Since the last visit the patient denies any new symptoms.  REVIEW OF SYSTEMS: Out of a complete 14 system review of symptoms, the patient complains only of the following symptoms, fatigue and all other reviewed systems are negative.  ESS: 9 FSS:56  ALLERGIES: Allergies  Allergen Reactions  . Sulfamethoxazole     REACTION: unspecified  HOME MEDICATIONS: Outpatient Medications Prior to Visit  Medication Sig Dispense Refill  . amitriptyline (ELAVIL) 10 MG tablet Take 10 mg by mouth at bedtime.    . balsalazide (COLAZAL) 750 MG capsule TAKE 1 CAPSULE(750 MG) BY MOUTH TWICE DAILY 60 capsule 2  . celecoxib (CELEBREX) 200 MG capsule Take 200 mg by mouth daily.    . Cholecalciferol (VITAMIN D) 2000 UNITS tablet Take 4,000 Units by mouth daily.    . diazepam (VALIUM) 5 MG tablet Take 2.5 mg by mouth at bedtime as needed.    . diphenhydrAMINE (BENADRYL) 25 MG tablet Take 25 mg by mouth as needed.    . fish oil-omega-3 fatty acids 1000  MG capsule Take 2 g by mouth daily.      Marland Kitchen glucosamine-chondroitin 500-400 MG tablet Take 1 tablet by mouth 3 (three) times daily.      . Lycopene 10 MG CAPS Take 10 mg by mouth.    . Multiple Vitamin (MULTIVITAMIN) tablet Take 1 tablet by mouth daily.      . predniSONE (DELTASONE) 5 MG tablet   4  . simvastatin (ZOCOR) 20 MG tablet Take 20 mg by mouth daily after supper.  1  . valACYclovir (VALTREX) 500 MG tablet Take 500 mg by mouth 2 (two) times daily as needed. PRN     . ZYTIGA 250 MG tablet 1,000 mg daily.   4   No facility-administered medications prior to visit.    PAST MEDICAL HISTORY: Past Medical History:  Diagnosis Date  . Allergy   . Anxiety   . Arthritis   . Hepatic steatosis 2016  . Hyperlipidemia   . Intermittent depression   . Metastatic cancer to bone ([place])    left rib.  Marland Kitchen Neoplasm of face    benign  . Pneumonia    left lung with a loculated effusion   . Prostate cancer ([place])   . Recurrent HSV (herpes simplex virus)   . Sleep apnea   . Ulcerative colitis     PAST SURGICAL HISTORY: Past Surgical History:  Procedure Laterality Date  . DECORTICATION Left 09/06/2016   Procedure: DECORTICATION;  Surgeon: [name], MD;  Location: [place];  Service: Thoracic;  Laterality: Left;  . INGUINAL HERNIA REPAIR    . PROSTATECTOMY  04/2015  . SHOULDER ARTHROTOMY Right   . VIDEO ASSISTED THORACOSCOPY (VATS)/EMPYEMA Left 09/06/2016   Procedure: VIDEO ASSISTED THORACOSCOPY (VATS)/DRAIN EMPYEMA;  Surgeon: [name], MD;  Location: [place];  Service: Thoracic;  Laterality: Left;  Marland Kitchen VIDEO BRONCHOSCOPY N/A 09/06/2016   Procedure: VIDEO BRONCHOSCOPY;  Surgeon: [name], MD;  Location: [facility] OR;  Service: Thoracic;  Laterality: N/A;    FAMILY HISTORY: Family History  Problem Relation Age of Onset  . Heart attack Father   . Hypertension Father   . Hyperlipidemia Other   . Diabetes Other   . Thyroid disease Other   . Lung cancer Other   . Breast  cancer Other   . Colon cancer Other   . Juvenile idiopathic arthritis Daughter   . Raynaud syndrome Daughter   . Breast cancer Mother     SOCIAL HISTORY: Social History   Socioeconomic History  . Marital status: Legally Separated    Spouse name: Not on file  . Number of children: 3  . Years of education: Not on file  . Highest education level: Not on file  Occupational History  . Occupation: Charity fundraiser  Tobacco Use  . Smoking status: Former  Smoker  . Smokeless tobacco: Never Used  . Tobacco comment: quit 1992  Vaping Use  . Vaping Use: Never used  Substance and Sexual Activity  . Alcohol use: Yes    Alcohol/week: 0.0 standard drinks  . Drug use: No  . Sexual activity: Not on file  Other Topics Concern  . Not on file  Social History Narrative   [facility] (09/04/16):   Currently he owns a flooring company. Questionable asbestos exposure. No mold exposure. Originally from [place]. Does have a dog but no bird exposure.   Social Determinants of Health   Financial Resource Strain:   . Difficulty of Paying Living Expenses: Not on file  Food Insecurity:   . Worried About Charity fundraiser in the Last Year: Not on file  . Ran Out of Food in the Last Year: Not on file  Transportation Needs:   . Lack of Transportation (Medical): Not on file  . Lack of Transportation (Non-Medical): Not on file  Physical Activity:   . Days of Exercise per Week: Not on file  . Minutes of Exercise per Session: Not on file  Stress:   . Feeling of Stress : Not on file  Social Connections:   . Frequency of Communication with Friends and Family: Not on file  . Frequency of Social Gatherings with Friends and Family: Not on file  . Attends Religious Services: Not on file  . Active Member of Clubs or Organizations: Not on file  . Attends Archivist Meetings: Not on file  . Marital Status: Not on file  Intimate Partner Violence:   . Fear of Current or Ex-Partner:  Not on file  . Emotionally Abused: Not on file  . Physically Abused: Not on file  . Sexually Abused: Not on file      PHYSICAL EXAM  Vitals:   04/13/20 0721  BP: (!) 147/87  Pulse: 66  Weight: 185 lb (83.9 kg)  Height: 5\' 7"  (1.702 m)   Body mass index is 28.98 kg/m.  Generalized: Well developed, in no acute distress  Cardiology: normal rate and rhythm, no murmur noted Respiratory: clear to auscultation bilaterally  Neurological examination  Mentation: Alert oriented to time, place, history taking. Follows all commands speech and language fluent Cranial nerve II-XII: Pupils were equal round reactive to light. Extraocular movements were full, visual field were full  Motor: The motor testing reveals 5 over 5 strength of all 4 extremities. Good symmetric motor tone is noted throughout.  Gait and station: Gait is normal.   DIAGNOSTIC DATA (LABS, IMAGING, TESTING) - I reviewed patient records, labs, notes, testing and imaging myself where available.  No flowsheet data found.   Lab Results  Component Value Date   WBC 9.7 09/09/2016   HGB 8.9 (L) 09/09/2016   HCT 27.8 (L) 09/09/2016   MCV 94.2 09/09/2016   PLT 474 (H) 09/09/2016      Component Value Date/Time   NA 140 09/11/2016 0250   K 4.0 09/11/2016 0250   CL 102 09/11/2016 0250   CO2 29 09/11/2016 0250   GLUCOSE 115 (H) 09/11/2016 0250   GLUCOSE 100 (H) 05/02/2006 1201   BUN 16 09/11/2016 0250   CREATININE 0.88 09/11/2016 0250   CALCIUM 8.5 (L) 09/11/2016 0250   PROT 5.1 (L) 09/08/2016 0430   ALBUMIN 1.9 (L) 09/08/2016 0430   AST 45 (H) 09/08/2016 0430   ALT 73 (H) 09/08/2016 0430   ALKPHOS 189 (H) 09/08/2016 0430   BILITOT  0.3 09/08/2016 0430   GFRNONAA >60 09/11/2016 0250   GFRAA >60 09/11/2016 0250   Lab Results  Component Value Date   CHOL 172 10/15/2013   HDL 57.40 10/15/2013   LDLCALC 105 (H) 10/15/2013   LDLDIRECT 148.3 09/03/2011   TRIG 46.0 10/15/2013   CHOLHDL 3 10/15/2013   Lab Results    Component Value Date   HGBA1C 6.3 (H) 08/30/2006   Lab Results  Component Value Date   ZSWFUXNA35 573 09/03/2016   Lab Results  Component Value Date   TSH 1.704 09/03/2016       ASSESSMENT AND PLAN 64 y.o. year old male  has a past medical history of Allergy, Anxiety, Arthritis, Hepatic steatosis (2016), Hyperlipidemia, Intermittent depression, Metastatic cancer to bone ([place]), Neoplasm of face, Pneumonia, Prostate cancer ([place]), Recurrent HSV (herpes simplex virus), Sleep apnea, and Ulcerative colitis. here with     ICD-10-CM   1. OSA on CPAP  G47.33 For home use only DME continuous positive airway pressure (CPAP)   Z99.89      Mr Wahlen is doing very well today.  Compliance report reveals excellent compliance.  He was encouraged to continue using CPAP nightly and for greater than 4 hours each night.  Sleep hygiene reviewed.  Healthy lifestyle habits encouraged.  He will continue close follow-up with oncology and primary care.  He will follow-up with me in 1 year, sooner if needed.  He verbalizes understanding and agreement with this plan.  Orders Placed This Encounter  Procedures  . For home use only DME continuous positive airway pressure (CPAP)    Supplies    Order Specific Question:   Length of Need    Answer:   Lifetime    Order Specific Question:   Patient has OSA or probable OSA    Answer:   Yes    Order Specific Question:   Is the patient currently using CPAP in the home    Answer:   Yes    Order Specific Question:   Settings    Answer:   Other see comments    Order Specific Question:   CPAP supplies needed    Answer:   Mask, headgear, cushions, filters, heated tubing and water chamber     No orders of the defined types were placed in this encounter.     I spent 15 minutes with the patient. 50% of this time was spent counseling and educating patient on plan of care and medications.    Debbora Presto, FNP-C 04/13/2020, 7:51 AM [facility] [address], [place], [place] [address] [phone]  I reviewed the above note and documentation by the Nurse Practitioner and agree with the history, exam, assessment and plan as outlined above. I was available for consultation. Star Age, MD, PhD [facility])

## 2020-04-14 NOTE — Progress Notes (Signed)
Order for cpap supplies sent to [facility] via community msg. Confirmation received that the order transmitted was successful.  

## 2020-04-15 DIAGNOSIS — C61 Malignant neoplasm of prostate: Secondary | ICD-10-CM | POA: Diagnosis not present

## 2020-04-18 DIAGNOSIS — R232 Flushing: Secondary | ICD-10-CM | POA: Diagnosis not present

## 2020-04-18 DIAGNOSIS — Z23 Encounter for immunization: Secondary | ICD-10-CM | POA: Diagnosis not present

## 2020-04-18 DIAGNOSIS — Z87891 Personal history of nicotine dependence: Secondary | ICD-10-CM | POA: Diagnosis not present

## 2020-04-18 DIAGNOSIS — Z191 Hormone sensitive malignancy status: Secondary | ICD-10-CM | POA: Diagnosis not present

## 2020-04-18 DIAGNOSIS — C7951 Secondary malignant neoplasm of bone: Secondary | ICD-10-CM | POA: Diagnosis not present

## 2020-04-18 DIAGNOSIS — Z79818 Long term (current) use of other agents affecting estrogen receptors and estrogen levels: Secondary | ICD-10-CM | POA: Diagnosis not present

## 2020-04-18 DIAGNOSIS — C61 Malignant neoplasm of prostate: Secondary | ICD-10-CM | POA: Diagnosis not present

## 2020-04-19 DIAGNOSIS — M1711 Unilateral primary osteoarthritis, right knee: Secondary | ICD-10-CM | POA: Diagnosis not present

## 2020-05-24 DIAGNOSIS — E7800 Pure hypercholesterolemia, unspecified: Secondary | ICD-10-CM | POA: Diagnosis not present

## 2020-06-20 ENCOUNTER — Other Ambulatory Visit: Payer: Self-pay | Admitting: Thoracic Surgery (Cardiothoracic Vascular Surgery)

## 2020-06-20 DIAGNOSIS — I712 Thoracic aortic aneurysm, without rupture, unspecified: Secondary | ICD-10-CM

## 2020-06-20 DIAGNOSIS — C7951 Secondary malignant neoplasm of bone: Secondary | ICD-10-CM | POA: Diagnosis not present

## 2020-06-20 DIAGNOSIS — N5231 Erectile dysfunction following radical prostatectomy: Secondary | ICD-10-CM | POA: Diagnosis not present

## 2020-06-20 DIAGNOSIS — N3946 Mixed incontinence: Secondary | ICD-10-CM | POA: Diagnosis not present

## 2020-06-20 DIAGNOSIS — C61 Malignant neoplasm of prostate: Secondary | ICD-10-CM | POA: Diagnosis not present

## 2020-07-07 DIAGNOSIS — G4733 Obstructive sleep apnea (adult) (pediatric): Secondary | ICD-10-CM | POA: Diagnosis not present

## 2020-07-19 ENCOUNTER — Inpatient Hospital Stay: Admission: RE | Admit: 2020-07-19 | Payer: BC Managed Care – PPO | Source: Ambulatory Visit

## 2020-07-20 DIAGNOSIS — R232 Flushing: Secondary | ICD-10-CM | POA: Diagnosis not present

## 2020-07-20 DIAGNOSIS — Z79818 Long term (current) use of other agents affecting estrogen receptors and estrogen levels: Secondary | ICD-10-CM | POA: Diagnosis not present

## 2020-07-20 DIAGNOSIS — C7951 Secondary malignant neoplasm of bone: Secondary | ICD-10-CM | POA: Diagnosis not present

## 2020-07-20 DIAGNOSIS — C61 Malignant neoplasm of prostate: Secondary | ICD-10-CM | POA: Diagnosis not present

## 2020-07-20 DIAGNOSIS — Z79899 Other long term (current) drug therapy: Secondary | ICD-10-CM | POA: Diagnosis not present

## 2020-07-26 ENCOUNTER — Ambulatory Visit: Payer: BC Managed Care – PPO | Admitting: Thoracic Surgery (Cardiothoracic Vascular Surgery)

## 2020-08-16 ENCOUNTER — Other Ambulatory Visit: Payer: Self-pay | Admitting: Thoracic Surgery (Cardiothoracic Vascular Surgery)

## 2020-08-30 DIAGNOSIS — H40013 Open angle with borderline findings, low risk, bilateral: Secondary | ICD-10-CM | POA: Diagnosis not present

## 2020-09-05 DIAGNOSIS — E7800 Pure hypercholesterolemia, unspecified: Secondary | ICD-10-CM | POA: Diagnosis not present

## 2020-09-06 ENCOUNTER — Ambulatory Visit (INDEPENDENT_AMBULATORY_CARE_PROVIDER_SITE_OTHER): Payer: BC Managed Care – PPO | Admitting: Thoracic Surgery (Cardiothoracic Vascular Surgery)

## 2020-09-06 ENCOUNTER — Ambulatory Visit
Admission: RE | Admit: 2020-09-06 | Discharge: 2020-09-06 | Disposition: A | Discharge status: Home / Self Care | Payer: BC Managed Care – PPO | Source: Ambulatory Visit | Attending: Thoracic Surgery (Cardiothoracic Vascular Surgery) | Admitting: Thoracic Surgery (Cardiothoracic Vascular Surgery)

## 2020-09-06 ENCOUNTER — Other Ambulatory Visit: Payer: Self-pay

## 2020-09-06 VITALS — BP 120/77 | HR 90 | Resp 20 | Ht 67.0 in | Wt 186.0 lb

## 2020-09-06 DIAGNOSIS — I712 Thoracic aortic aneurysm, without rupture, unspecified: Secondary | ICD-10-CM

## 2020-09-06 DIAGNOSIS — I7121 Aneurysm of the ascending aorta, without rupture: Secondary | ICD-10-CM

## 2020-09-06 DIAGNOSIS — C61 Malignant neoplasm of prostate: Secondary | ICD-10-CM | POA: Diagnosis not present

## 2020-09-06 DIAGNOSIS — I714 Abdominal aortic aneurysm, without rupture: Secondary | ICD-10-CM | POA: Diagnosis not present

## 2020-09-06 MED ORDER — IOPAMIDOL (ISOVUE-370) INJECTION 76%
75.0000 mL | Freq: Once | INTRAVENOUS | Status: AC | PRN
  Administered 2020-09-06: 75 mL via INTRAVENOUS

## 2020-09-06 NOTE — Progress Notes (Signed)
[address]       [place] [address]             [phone]      HPI: [name] returns for follow-up of his ascending aneurysm.  [name] is a 65 year old man with history of stage IV prostate cancer, ulcerative colitis, sleep apnea, hyperlipidemia, degenerative joint disease, left empyema, and an ascending aortic aneurysm.  I did a left VATS for drainage of an empyema and decortication in March 2018.  A CT during that admission showed a 4.1 cm ascending aneurysm.  I have been following him since then.  I last saw him a year ago.  He was doing well at that time.  In the interim since his last visit he saw Dr. Sandi Mariscal.  A murmur was noted on exam.  An echocardiogram was performed which showed a probable bicuspid valve with mild aortic stenosis.  He is not having any chest pain, pressure, tightness, or shortness of breath.  Overall he feels well.  Past Medical History:  Diagnosis Date  . Allergy   . Anxiety   . Arthritis   . Hepatic steatosis 2016  . Hyperlipidemia   . Intermittent depression   . Metastatic cancer to bone ([place])    left rib.  Marland Kitchen Neoplasm of face    benign  . Pneumonia    left lung with a loculated effusion   . Prostate cancer ([place])   . Recurrent HSV (herpes simplex virus)   . Sleep apnea   . Ulcerative colitis     Current Outpatient Medications  Medication Sig Dispense Refill  . amitriptyline (ELAVIL) 10 MG tablet Take 10 mg by mouth at bedtime.    . balsalazide (COLAZAL) 750 MG capsule TAKE 1 CAPSULE(750 MG) BY MOUTH TWICE DAILY 60 capsule 2  . celecoxib (CELEBREX) 200 MG capsule Take 200 mg by mouth 2 (two) times daily.    . Cholecalciferol (VITAMIN D) 2000 UNITS tablet Take 5,000 Units by mouth daily.    . diazepam (VALIUM) 5 MG tablet Take 2.5 mg by mouth at bedtime as needed.    . diphenhydrAMINE (BENADRYL) 25 MG tablet Take 25 mg by mouth as needed.    . fish oil-omega-3 fatty acids 1000 MG capsule Take 2 g by mouth  daily.    Marland Kitchen glucosamine-chondroitin 500-400 MG tablet Take 1 tablet by mouth 2 (two) times daily.    . Lycopene 10 MG CAPS Take 10 mg by mouth.    . Multiple Vitamin (MULTIVITAMIN) tablet Take 1 tablet by mouth daily.    . predniSONE (DELTASONE) 5 MG tablet   4  . simvastatin (ZOCOR) 20 MG tablet Take 20 mg by mouth daily after supper.  1  . valACYclovir (VALTREX) 500 MG tablet Take 500 mg by mouth 2 (two) times daily as needed. PRN    . ZYTIGA 250 MG tablet 1,000 mg daily.   4   No current facility-administered medications for this visit.    Physical Exam BP 120/77   Pulse 90   Resp 20   Ht 5\' 7"  (1.702 m)   Wt 186 lb (84.4 kg)   SpO2 98% Comment: RA  BMI 29.66 kg/m  65 year old man in no acute distress Alert and oriented x3 with no focal deficits Well-developed and well-nourished Cardiac regular rate and rhythm with a 2/6 systolic murmur right upper sternal border Lungs clear with equal breath sounds bilaterally Peripheral pulses intact No peripheral edema  Diagnostic  Tests: I reviewed the CT chest images.  Awaiting official report.  Possible minimal change in ascending aorta measuring about 4.2 to 4.3 cm.  No change in tiny right subpleural lung nodules.  Impression: [name] is a 65 year old man with a history of stage IV prostate cancer, ulcerative colitis, sleep apnea, hyperlipidemia, degenerative joint disease, left empyema, and an ascending aortic aneurysm.  Subpleural lung nodules right lung-unchanged over several years.  Benign nodules probable subpleural lymph nodes.  Ascending aneurysm-stable to minimally (1 mm) larger.  Needs continued annual follow-up.  Blood pressure is well controlled.  No history of hypertension.  Hyperlipidemia-on simvastatin.  Plan: Return in 1 year with CT angio of chest  I spent 23 minutes in review of records, images, and consultation with [name] today. [name], MD [facility] [phone]

## 2020-09-07 DIAGNOSIS — H68012 Acute Eustachian salpingitis, left ear: Secondary | ICD-10-CM | POA: Diagnosis not present

## 2020-09-08 ENCOUNTER — Other Ambulatory Visit: Payer: BC Managed Care – PPO

## 2020-10-17 DIAGNOSIS — Z9079 Acquired absence of other genital organ(s): Secondary | ICD-10-CM | POA: Diagnosis not present

## 2020-10-17 DIAGNOSIS — C7951 Secondary malignant neoplasm of bone: Secondary | ICD-10-CM | POA: Diagnosis not present

## 2020-10-17 DIAGNOSIS — I77810 Thoracic aortic ectasia: Secondary | ICD-10-CM | POA: Diagnosis not present

## 2020-10-17 DIAGNOSIS — C61 Malignant neoplasm of prostate: Secondary | ICD-10-CM | POA: Diagnosis not present

## 2020-10-17 DIAGNOSIS — Z7952 Long term (current) use of systemic steroids: Secondary | ICD-10-CM | POA: Diagnosis not present

## 2020-10-17 DIAGNOSIS — Z87891 Personal history of nicotine dependence: Secondary | ICD-10-CM | POA: Diagnosis not present

## 2020-10-17 DIAGNOSIS — R937 Abnormal findings on diagnostic imaging of other parts of musculoskeletal system: Secondary | ICD-10-CM | POA: Diagnosis not present

## 2020-10-17 DIAGNOSIS — Z96651 Presence of right artificial knee joint: Secondary | ICD-10-CM | POA: Diagnosis not present

## 2020-10-17 DIAGNOSIS — I358 Other nonrheumatic aortic valve disorders: Secondary | ICD-10-CM | POA: Diagnosis not present

## 2020-10-17 DIAGNOSIS — Z8546 Personal history of malignant neoplasm of prostate: Secondary | ICD-10-CM | POA: Diagnosis not present

## 2020-10-17 DIAGNOSIS — Z79818 Long term (current) use of other agents affecting estrogen receptors and estrogen levels: Secondary | ICD-10-CM | POA: Diagnosis not present

## 2020-10-26 DIAGNOSIS — T162XXA Foreign body in left ear, initial encounter: Secondary | ICD-10-CM | POA: Diagnosis not present

## 2020-10-26 DIAGNOSIS — H6983 Other specified disorders of Eustachian tube, bilateral: Secondary | ICD-10-CM | POA: Diagnosis not present

## 2020-11-09 DIAGNOSIS — Z20822 Contact with and (suspected) exposure to covid-19: Secondary | ICD-10-CM | POA: Diagnosis not present

## 2020-12-07 DIAGNOSIS — E7800 Pure hypercholesterolemia, unspecified: Secondary | ICD-10-CM | POA: Diagnosis not present

## 2021-01-18 DIAGNOSIS — C7951 Secondary malignant neoplasm of bone: Secondary | ICD-10-CM | POA: Diagnosis not present

## 2021-01-18 DIAGNOSIS — Z79818 Long term (current) use of other agents affecting estrogen receptors and estrogen levels: Secondary | ICD-10-CM | POA: Diagnosis not present

## 2021-01-18 DIAGNOSIS — Z79899 Other long term (current) drug therapy: Secondary | ICD-10-CM | POA: Diagnosis not present

## 2021-01-18 DIAGNOSIS — R232 Flushing: Secondary | ICD-10-CM | POA: Diagnosis not present

## 2021-01-18 DIAGNOSIS — C61 Malignant neoplasm of prostate: Secondary | ICD-10-CM | POA: Diagnosis not present

## 2021-03-02 DIAGNOSIS — H40013 Open angle with borderline findings, low risk, bilateral: Secondary | ICD-10-CM | POA: Diagnosis not present

## 2021-03-08 DIAGNOSIS — G4733 Obstructive sleep apnea (adult) (pediatric): Secondary | ICD-10-CM | POA: Diagnosis not present

## 2021-04-11 DIAGNOSIS — E7800 Pure hypercholesterolemia, unspecified: Secondary | ICD-10-CM | POA: Diagnosis not present

## 2021-04-12 NOTE — Patient Instructions (Signed)

## 2021-04-12 NOTE — Progress Notes (Signed)
PATIENT: [name] DOB: [birth year]  REASON FOR VISIT: follow up HISTORY FROM: patient  Chief Complaint  Patient presents with   Obstructive Sleep Apnea    Rm 2, alone. Here for yearly CPAP f/u. Pt reports doing well. No issues or concerns today.       HISTORY OF PRESENT ILLNESS: 04/13/21 ALL: [name] returns for follow up for OSA on CPAP. He continues to do well with CPAP. He is using it every night. He admits there are nights where he may remove early or fall asleep before starting therapy. He feels fatigue is better. He continues to work in [employer]. He continues close follow up with PCP and urology. He is feeling well, today, and without concerns.     04/13/2020 ALL:  [name] is a 65 y.o. male here today for follow up for OSA on CPAP. He is doing very well on CPAP. He uses his machine every night. He does continue to suffer from fatigue and relates this to prostate cancer treatments. He is followed closely by PCP and [facility] oncology.   Compliance Report dated 03/13/2020 through 04/11/2020 reveals that he used CPAP thirty of the past 30 days for compliance of 100%.  The CPAP greater than 4 hours twenty-seven of the past 30 days for compliance of 90%.  Average usage on days used was 6 hours and 34 minutes.  Residual AHI was 2.3 on 5 to 13 cm of water and an EPR of three.  There was no significant leak noted.  HISTORY: (copied from my note on 04/13/2019)  [name] is a 65 y.o. male here today for follow up for OSA on CPAP.  He is doing very well on CPAP therapy.  He recently received his new CPAP machine and reports that he cannot sleep without it.  He is very happy with his new machine.   Compliance report dated 03/10/2019 3930 2020 reveals that he is using CPAP every night for compliance 100%.  29 of the last 39 CPAP greater than 4 hours for compliance of 97%.  Average usage was 7 hours and 5 minutes.  AHI was 2.4 on 5 to 13 cm of water and an EPR of 3.  There was no  significant leak noted.   HISTORY: (copied from [place] note on 04/09/2018)   [name] is a 65 year old male with a history of obstructive sleep apnea on CPAP.  He returns today for follow-up.  His CPAP download indicates that he uses machine 21 out of 30 days for compliance of 70%.  He uses machine greater than 4 hours 17 days for compliance of 57%.  On average he uses his machine 5 hours and 44 minutes.  His residual AHI is 2.4 on 5 to 13 cm of water with EPR of 3.  He does not have a significant leak.  He states that there is some nights that he is exhausted and just simply does not put the machine on.  His Epworth sleepiness score is 8.  He is being followed at [facility] for prostate cancer.  He notes that since he started treatment he is noticed some changes in his memory.  According to their note they have refer the patient to neuropsychology for evaluation.  He returns today for evaluation.     HISTORY 04/09/17: [name] is a 65 year old male with history of obstructive sleep Apnea on CPAP. He returns today for a compliance download. His download shows that  he uses his machine 30/30 days for a compliance of 100%. He used his machine >4 hours 30/30 nights. On average he uses his machine regularly every night.  He currently is using ResMed AirFit10 medium mask. Patient reports that he changes out his supplies approximately every 6 months. Overall patient feels that CPAP has improved his sleepiness and fatigue. Since the last visit the patient denies any new symptoms.  REVIEW OF SYSTEMS: Out of a complete 14 system review of symptoms, the patient complains only of the following symptoms, fatigue and all other reviewed systems are negative.  ESS: 4  ALLERGIES: Allergies  Allergen Reactions   Sulfamethoxazole     REACTION: unspecified    HOME MEDICATIONS: Outpatient Medications Prior to Visit  Medication Sig Dispense Refill   amitriptyline (ELAVIL) 10 MG tablet Take 10 mg by  mouth at bedtime.     balsalazide (COLAZAL) 750 MG capsule TAKE 1 CAPSULE(750 MG) BY MOUTH TWICE DAILY 60 capsule 2   celecoxib (CELEBREX) 200 MG capsule Take 200 mg by mouth 2 (two) times daily.     Cholecalciferol (VITAMIN D) 2000 UNITS tablet Take 5,000 Units by mouth daily.     diazepam (VALIUM) 5 MG tablet Take 2.5 mg by mouth at bedtime as needed.     diphenhydrAMINE (BENADRYL) 25 MG tablet Take 25 mg by mouth as needed.     fish oil-omega-3 fatty acids 1000 MG capsule Take 2 g by mouth daily.     glucosamine-chondroitin 500-400 MG tablet Take 1 tablet by mouth 2 (two) times daily.     Lycopene 10 MG CAPS Take 10 mg by mouth.     Multiple Vitamin (MULTIVITAMIN) tablet Take 1 tablet by mouth daily.     predniSONE (DELTASONE) 5 MG tablet   4   simvastatin (ZOCOR) 20 MG tablet Take 20 mg by mouth daily after supper.  1   valACYclovir (VALTREX) 500 MG tablet Take 500 mg by mouth 2 (two) times daily as needed. PRN     ZYTIGA 250 MG tablet 1,000 mg daily.   4   No facility-administered medications prior to visit.    PAST MEDICAL HISTORY: Past Medical History:  Diagnosis Date   Allergy    Anxiety    Arthritis    Hepatic steatosis 2016   Hyperlipidemia    Intermittent depression    Metastatic cancer to bone ([place])    left rib.   Neoplasm of face    benign   Pneumonia    left lung with a loculated effusion    Prostate cancer ([place])    Recurrent HSV (herpes simplex virus)    Sleep apnea    Ulcerative colitis     PAST SURGICAL HISTORY: Past Surgical History:  Procedure Laterality Date   DECORTICATION Left 09/06/2016   Procedure: DECORTICATION;  Surgeon: [name], MD;  Location: [place];  Service: Thoracic;  Laterality: Left;   INGUINAL HERNIA REPAIR     PROSTATECTOMY  04/2015   SHOULDER ARTHROTOMY Right    VIDEO ASSISTED THORACOSCOPY (VATS)/EMPYEMA Left 09/06/2016   Procedure: VIDEO ASSISTED THORACOSCOPY (VATS)/DRAIN EMPYEMA;  Surgeon: [name], MD;   Location: [place];  Service: Thoracic;  Laterality: Left;   VIDEO BRONCHOSCOPY N/A 09/06/2016   Procedure: VIDEO BRONCHOSCOPY;  Surgeon: [name], MD;  Location: [facility] OR;  Service: Thoracic;  Laterality: N/A;    FAMILY HISTORY: Family History  Problem Relation Age of Onset   Heart attack Father    Hypertension Father  Hyperlipidemia Other    Diabetes Other    Thyroid disease Other    Lung cancer Other    Breast cancer Other    Colon cancer Other    Juvenile idiopathic arthritis Daughter    Raynaud syndrome Daughter    Breast cancer Mother     SOCIAL HISTORY: Social History   Socioeconomic History   Marital status: Legally Separated    Spouse name: Not on file   Number of children: 3   Years of education: Not on file   Highest education level: Not on file  Occupational History   Occupation: Charity fundraiser  Tobacco Use   Smoking status: Former   Smokeless tobacco: Never   Tobacco comments:    quit 1992  Vaping Use   Vaping Use: Never used  Substance and Sexual Activity   Alcohol use: Yes    Alcohol/week: 0.0 standard drinks   Drug use: No   Sexual activity: Not on file  Other Topics Concern   Not on file  Social History Narrative    Pulmonary (09/04/16):   Currently he owns a flooring company. Questionable asbestos exposure. No mold exposure. Originally from [place]. Does have a dog but no bird exposure.   Social Determinants of Health   Financial Resource Strain: Not on file  Food Insecurity: Not on file  Transportation Needs: Not on file  Physical Activity: Not on file  Stress: Not on file  Social Connections: Not on file  Intimate Partner Violence: Not on file      PHYSICAL EXAM  Vitals:   04/13/21 0759  BP: 134/87  Pulse: 69  Weight: 186 lb 8 oz (84.6 kg)  Height: 5\' 7"  (1.702 m)    Body mass index is 29.21 kg/m.  Generalized: Well developed, in no acute distress  Cardiology: normal rate and rhythm, no murmur  noted Respiratory: clear to auscultation bilaterally  Neurological examination  Mentation: Alert oriented to time, place, history taking. Follows all commands speech and language fluent Cranial nerve II-XII: Pupils were equal round reactive to light. Extraocular movements were full, visual field were full  Motor: The motor testing reveals 5 over 5 strength of all 4 extremities. Good symmetric motor tone is noted throughout.  Gait and station: Gait is normal.   DIAGNOSTIC DATA (LABS, IMAGING, TESTING) - I reviewed patient records, labs, notes, testing and imaging myself where available.  No flowsheet data found.   Lab Results  Component Value Date   WBC 9.7 09/09/2016   HGB 8.9 (L) 09/09/2016   HCT 27.8 (L) 09/09/2016   MCV 94.2 09/09/2016   PLT 474 (H) 09/09/2016      Component Value Date/Time   NA 140 09/11/2016 0250   K 4.0 09/11/2016 0250   CL 102 09/11/2016 0250   CO2 29 09/11/2016 0250   GLUCOSE 115 (H) 09/11/2016 0250   GLUCOSE 100 (H) 05/02/2006 1201   BUN 16 09/11/2016 0250   CREATININE 0.88 09/11/2016 0250   CALCIUM 8.5 (L) 09/11/2016 0250   PROT 5.1 (L) 09/08/2016 0430   ALBUMIN 1.9 (L) 09/08/2016 0430   AST 45 (H) 09/08/2016 0430   ALT 73 (H) 09/08/2016 0430   ALKPHOS 189 (H) 09/08/2016 0430   BILITOT 0.3 09/08/2016 0430   GFRNONAA >60 09/11/2016 0250   GFRAA >60 09/11/2016 0250   Lab Results  Component Value Date   CHOL 172 10/15/2013   HDL 57.40 10/15/2013   LDLCALC 105 (H) 10/15/2013   LDLDIRECT 148.3 09/03/2011  TRIG 46.0 10/15/2013   CHOLHDL 3 10/15/2013   Lab Results  Component Value Date   HGBA1C 6.3 (H) 08/30/2006   Lab Results  Component Value Date   NZVJKQAS60 156 09/03/2016   Lab Results  Component Value Date   TSH 1.704 09/03/2016       ASSESSMENT AND PLAN 65 y.o. year old male  has a past medical history of Allergy, Anxiety, Arthritis, Hepatic steatosis (2016), Hyperlipidemia, Intermittent depression, Metastatic cancer to  bone ([place]), Neoplasm of face, Pneumonia, Prostate cancer ([place]), Recurrent HSV (herpes simplex virus), Sleep apnea, and Ulcerative colitis. here with     ICD-10-CM   1. OSA on CPAP  G47.33 For home use only DME continuous positive airway pressure (CPAP)   Z99.89         [name] is doing very well today.  Compliance report reveals excellent compliance.  He was encouraged to continue using CPAP nightly and for greater than 4 hours each night.  Sleep hygiene reviewed.  Healthy lifestyle habits encouraged.  He will continue close follow-up with oncology and primary care.  He will follow-up with me in 1 year, sooner if needed.  He verbalizes understanding and agreement with this plan.  Set up date 02/2017  Orders Placed This Encounter  Procedures   For home use only DME continuous positive airway pressure (CPAP)    Supplies    Order Specific Question:   Length of Need    Answer:   Lifetime    Order Specific Question:   Patient has OSA or probable OSA    Answer:   Yes    Order Specific Question:   Is the patient currently using CPAP in the home    Answer:   Yes    Order Specific Question:   Settings    Answer:   Other see comments    Order Specific Question:   CPAP supplies needed    Answer:   Mask, headgear, cushions, filters, heated tubing and water chamber      No orders of the defined types were placed in this encounter.      Debbora Presto, FNP-C 04/13/2021, 9:09 AM [facility] [address] [place], [place] [address] [phone]

## 2021-04-13 ENCOUNTER — Encounter: Payer: Self-pay | Admitting: Family Medicine

## 2021-04-13 ENCOUNTER — Ambulatory Visit (INDEPENDENT_AMBULATORY_CARE_PROVIDER_SITE_OTHER): Payer: BC Managed Care – PPO | Admitting: Family Medicine

## 2021-04-13 VITALS — BP 134/87 | HR 69 | Ht 67.0 in | Wt 186.5 lb

## 2021-04-13 DIAGNOSIS — G4733 Obstructive sleep apnea (adult) (pediatric): Secondary | ICD-10-CM | POA: Diagnosis not present

## 2021-04-13 DIAGNOSIS — Z9989 Dependence on other enabling machines and devices: Secondary | ICD-10-CM | POA: Diagnosis not present

## 2021-04-13 NOTE — Progress Notes (Signed)
CM sent to [facility]

## 2021-04-19 DIAGNOSIS — R030 Elevated blood-pressure reading, without diagnosis of hypertension: Secondary | ICD-10-CM | POA: Diagnosis not present

## 2021-04-19 DIAGNOSIS — C7951 Secondary malignant neoplasm of bone: Secondary | ICD-10-CM | POA: Diagnosis not present

## 2021-04-19 DIAGNOSIS — C61 Malignant neoplasm of prostate: Secondary | ICD-10-CM | POA: Diagnosis not present

## 2021-04-19 DIAGNOSIS — Z7952 Long term (current) use of systemic steroids: Secondary | ICD-10-CM | POA: Diagnosis not present

## 2021-04-19 DIAGNOSIS — Z87891 Personal history of nicotine dependence: Secondary | ICD-10-CM | POA: Diagnosis not present

## 2021-04-19 DIAGNOSIS — Z23 Encounter for immunization: Secondary | ICD-10-CM | POA: Diagnosis not present

## 2021-04-19 DIAGNOSIS — Z79818 Long term (current) use of other agents affecting estrogen receptors and estrogen levels: Secondary | ICD-10-CM | POA: Diagnosis not present

## 2021-04-19 DIAGNOSIS — R232 Flushing: Secondary | ICD-10-CM | POA: Diagnosis not present

## 2021-04-19 DIAGNOSIS — Z79899 Other long term (current) drug therapy: Secondary | ICD-10-CM | POA: Diagnosis not present

## 2021-05-31 DIAGNOSIS — M25562 Pain in left knee: Secondary | ICD-10-CM | POA: Diagnosis not present

## 2021-05-31 DIAGNOSIS — Z96651 Presence of right artificial knee joint: Secondary | ICD-10-CM | POA: Diagnosis not present

## 2021-06-03 DIAGNOSIS — M25562 Pain in left knee: Secondary | ICD-10-CM | POA: Diagnosis not present

## 2021-06-21 DIAGNOSIS — M25562 Pain in left knee: Secondary | ICD-10-CM | POA: Diagnosis not present

## 2021-06-21 DIAGNOSIS — S83242D Other tear of medial meniscus, current injury, left knee, subsequent encounter: Secondary | ICD-10-CM | POA: Diagnosis not present

## 2021-06-21 DIAGNOSIS — Z96651 Presence of right artificial knee joint: Secondary | ICD-10-CM | POA: Diagnosis not present

## 2021-06-25 IMAGING — CT CT ANGIO CHEST
2 of 6 series · 13 of 36 positions shown · IV contrast (iopamidol)
Comparison: CT angiogram chest July 18, 2018.

CLINICAL DATA: Thoracic aortic prominence. History of prostate
carcinoma.

EXAM:
CT ANGIOGRAPHY CHEST WITH CONTRAST
TECHNIQUE: Multidetector CT imaging of the chest was performed using the
standard protocol during bolus administration of intravenous
contrast. Multiplanar CT image reconstructions and MIPs were
obtained to evaluate the vascular anatomy.
CONTRAST:  75mL FFBDVT-XMO IOPAMIDOL (FFBDVT-XMO) INJECTION 76%

[Series 5: cta thorax 2.00 bv36 s3 axial arterial · axial · arterial · 0.57mm/px · z∈[+1357,+1621]mm · 12 of 158 slices shown]
[im 13/158  lung]
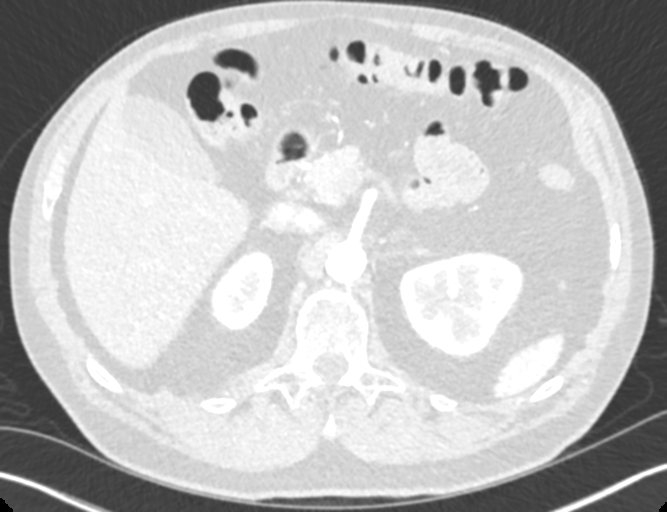
[im 25/158  mediastinal]
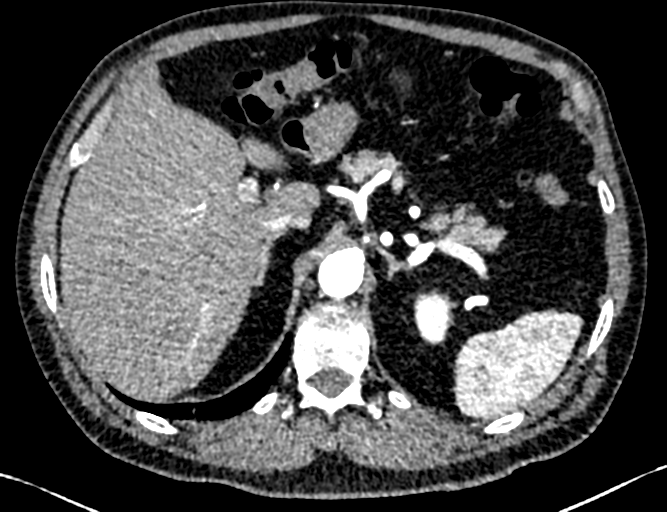
[im 37/158  lung]
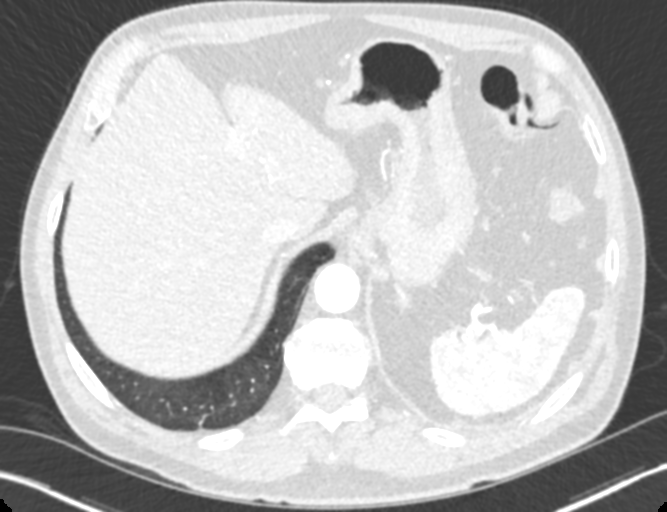
[im 49/158  mediastinal]
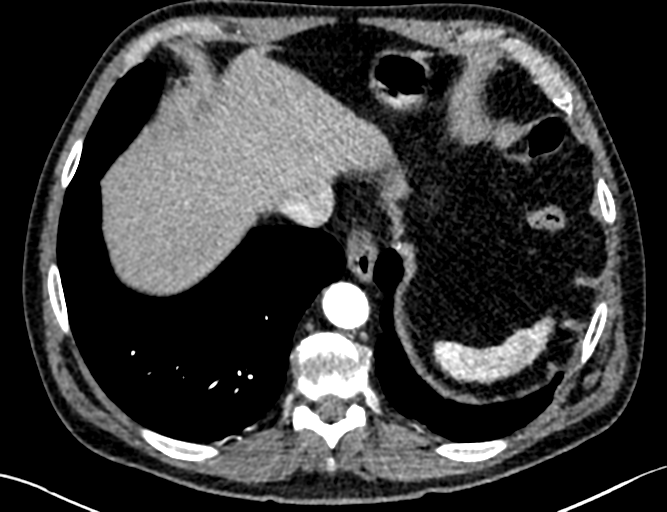
[im 61/158  lung]
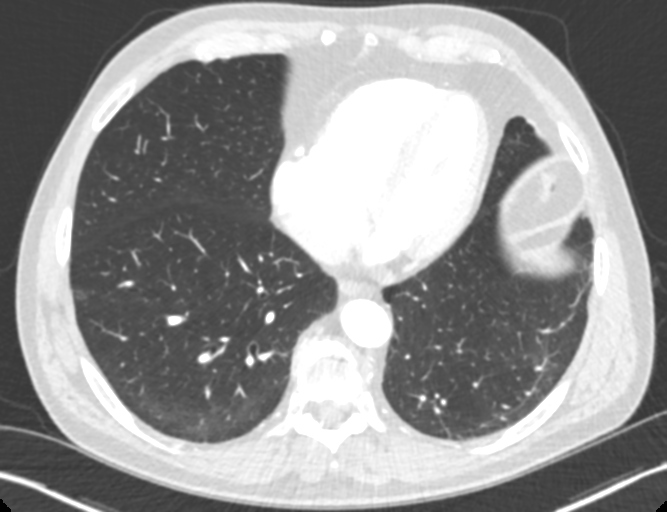
[im 73/158  mediastinal]
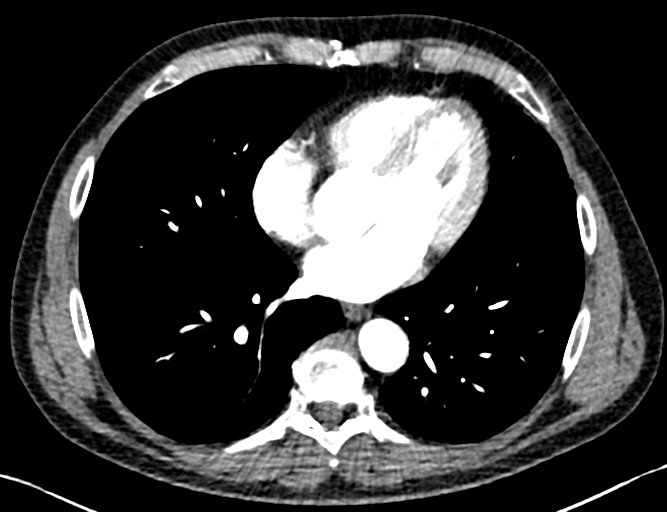
[im 85/158  lung]
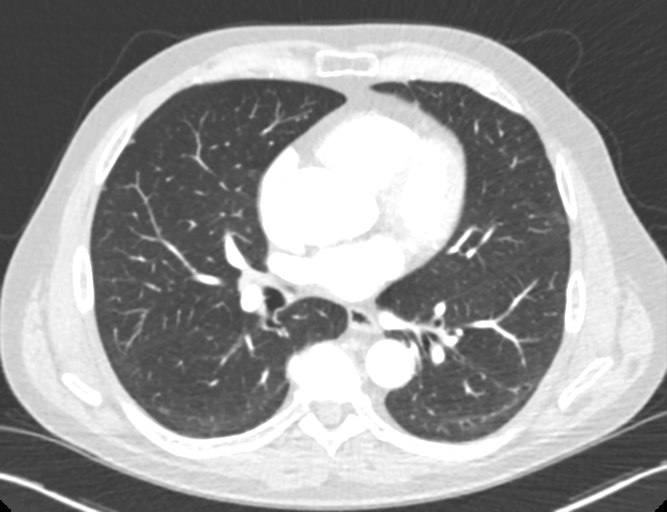
[im 97/158  mediastinal]
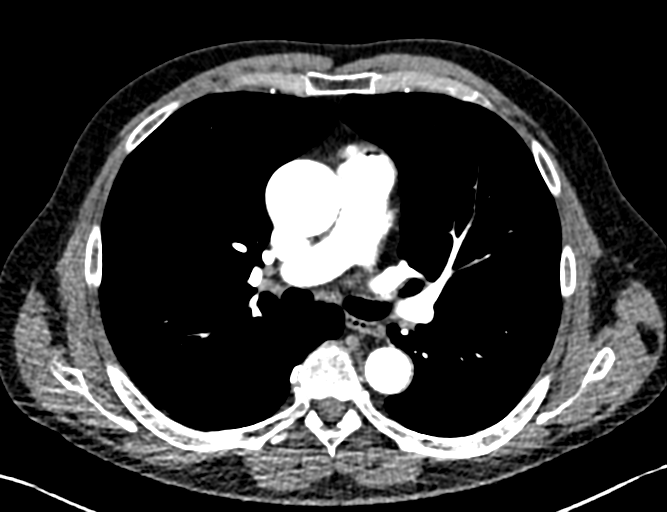
[im 109/158  lung]
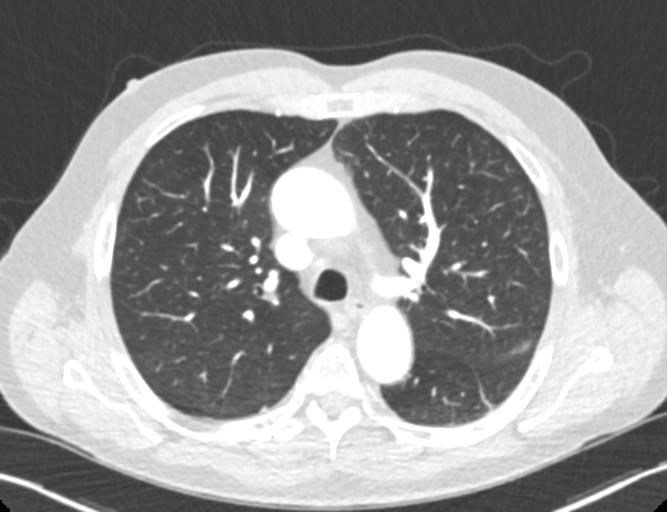
[im 121/158  mediastinal]
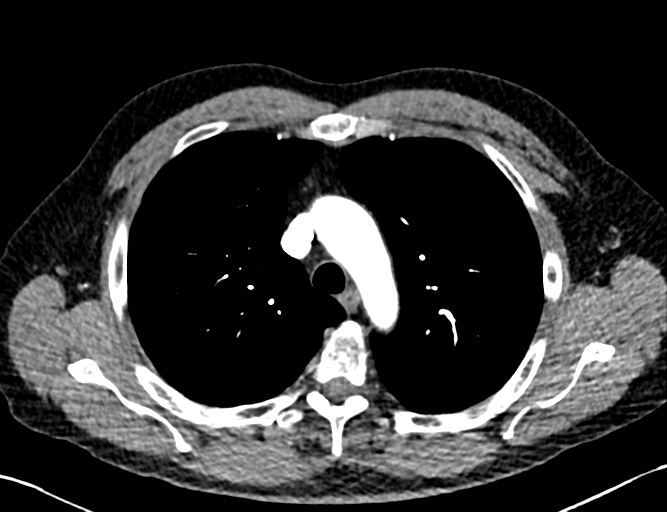
[im 133/158  lung]
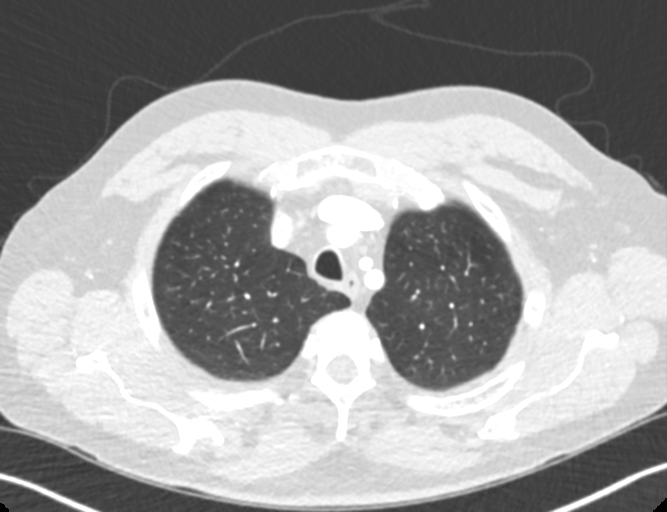
[im 145/158  mediastinal]
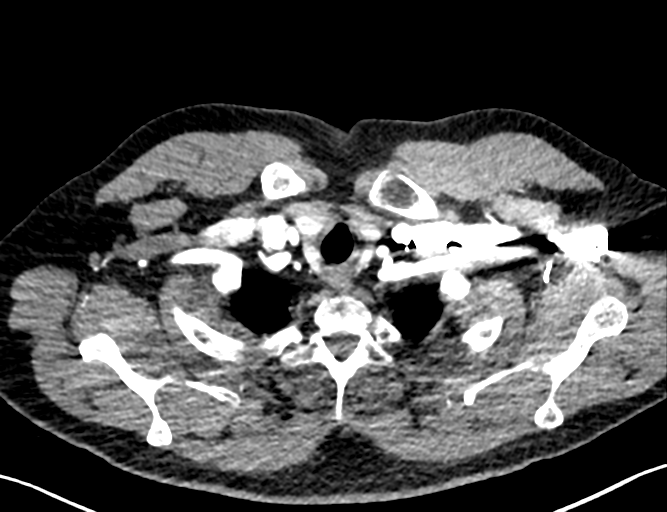

[Series 10: cta thorax 2.00 bv36 s3 cor st · coronal · 0.62mm/px · 1 of 145 slices shown]
[im 73/145  mediastinal]
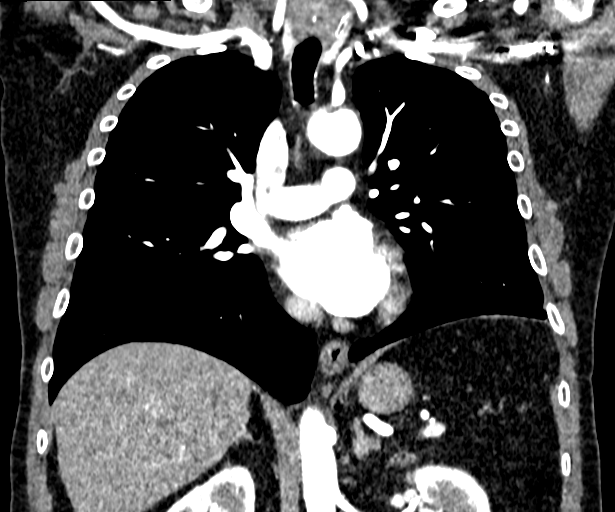

[13 of 36 positions shown; findings below may reference images not displayed]

FINDINGS: Cardiovascular: Ascending thoracic aortic diameter measures 4.2 x
4.2 cm, stable. The measured diameter at the sinuses of Valsalva
measures 3.7 cm. Measured diameter in the aortic arch measures
cm. Measured diameter of the descending aorta at the main pulmonary
outflow tract level measures 2.7 x 2.7 cm. There is no evident
thoracic aortic dissection. Visualized great vessels appear
unremarkable. Note that the right innominate and left common carotid
arteries arise as a common trunk, an anatomic variant. There is no
pericardial effusion or pericardial thickening. There is no
demonstrable pulmonary embolus.

Mediastinum/Nodes: Thyroid appears unremarkable. There is no
appreciable thoracic adenopathy. No esophageal lesions are evident.

Lungs/Pleura: There are scattered areas of atelectasis. There is
mild scarring in the left lung base. There is no edema or airspace
opacity. No pleural effusions are demonstrable. On axial slice 74
series 7, there is a 2 mm nodular opacity in the anterior segment
right upper lobe inferiorly.

Upper Abdomen: Visualized upper abdominal structures appear
unremarkable.

Musculoskeletal: There is degenerative change in the thoracic spine.
A small focus of sclerosis in the anterior left sixth rib is stable.
No new bone lesions demonstrable. No chest wall lesions appreciable.

Review of the MIP images confirms the above findings.
IMPRESSION: 1. Stable prominence of the ascending thoracic aorta with a measured
diameter 4.2 x 4.2 cm. No dissection evident. Recommend annual
imaging followup by CTA or MRA. This recommendation follows 8626
ACCF/AHA/AATS/ACR/ASA/SCA/BANDOH/JIM/RASHERS/THELUS Guidelines for the
Diagnosis and Management of Patients with Thoracic Aortic Disease.
Circulation. 8626; 121: E266-e369. Aortic aneurysm NOS
([id number]).

2.  No evident pulmonary embolus.

3. Areas of mild scarring and atelectasis. No edema or
consolidation. 2 mm nodular opacity on the right. No follow-up
needed if patient is low-risk. Non-contrast chest CT can be
considered in 12 months if patient is high-risk. Given history of
prostate carcinoma, particular attention this area on follow-up
study for the thoracic aorta advised. This recommendation follows
the consensus statement: Guidelines for Management of Incidental
Pulmonary Nodules Detected on CT Images: From the [HOSPITAL]

4.  No adenopathy evident.

5. Small sclerotic focus in the anterior left sixth rib, stable.
Small metastasis due to prostate carcinoma may be present in this
area.

## 2021-07-20 DIAGNOSIS — M542 Cervicalgia: Secondary | ICD-10-CM | POA: Diagnosis not present

## 2021-07-20 DIAGNOSIS — R232 Flushing: Secondary | ICD-10-CM | POA: Diagnosis not present

## 2021-07-20 DIAGNOSIS — Z79899 Other long term (current) drug therapy: Secondary | ICD-10-CM | POA: Diagnosis not present

## 2021-07-20 DIAGNOSIS — M47812 Spondylosis without myelopathy or radiculopathy, cervical region: Secondary | ICD-10-CM | POA: Diagnosis not present

## 2021-07-20 DIAGNOSIS — C7951 Secondary malignant neoplasm of bone: Secondary | ICD-10-CM | POA: Diagnosis not present

## 2021-07-20 DIAGNOSIS — Z79818 Long term (current) use of other agents affecting estrogen receptors and estrogen levels: Secondary | ICD-10-CM | POA: Diagnosis not present

## 2021-07-20 DIAGNOSIS — C61 Malignant neoplasm of prostate: Secondary | ICD-10-CM | POA: Diagnosis not present

## 2021-07-26 DIAGNOSIS — G4733 Obstructive sleep apnea (adult) (pediatric): Secondary | ICD-10-CM | POA: Diagnosis not present

## 2021-08-17 ENCOUNTER — Other Ambulatory Visit: Payer: Self-pay | Admitting: *Deleted

## 2021-08-17 DIAGNOSIS — I7121 Aneurysm of the ascending aorta, without rupture: Secondary | ICD-10-CM

## 2021-08-17 DIAGNOSIS — R918 Other nonspecific abnormal finding of lung field: Secondary | ICD-10-CM

## 2021-08-23 DIAGNOSIS — N3946 Mixed incontinence: Secondary | ICD-10-CM | POA: Diagnosis not present

## 2021-08-23 DIAGNOSIS — I712 Thoracic aortic aneurysm, without rupture, unspecified: Secondary | ICD-10-CM | POA: Diagnosis not present

## 2021-08-23 DIAGNOSIS — Z0000 Encounter for general adult medical examination without abnormal findings: Secondary | ICD-10-CM | POA: Diagnosis not present

## 2021-08-23 DIAGNOSIS — C61 Malignant neoplasm of prostate: Secondary | ICD-10-CM | POA: Diagnosis not present

## 2021-08-23 DIAGNOSIS — K5190 Ulcerative colitis, unspecified, without complications: Secondary | ICD-10-CM | POA: Diagnosis not present

## 2021-08-26 DIAGNOSIS — G4733 Obstructive sleep apnea (adult) (pediatric): Secondary | ICD-10-CM | POA: Diagnosis not present

## 2021-09-07 DIAGNOSIS — H40013 Open angle with borderline findings, low risk, bilateral: Secondary | ICD-10-CM | POA: Diagnosis not present

## 2021-09-08 DIAGNOSIS — M2041 Other hammer toe(s) (acquired), right foot: Secondary | ICD-10-CM | POA: Diagnosis not present

## 2021-09-12 ENCOUNTER — Other Ambulatory Visit: Payer: Self-pay

## 2021-09-12 ENCOUNTER — Encounter: Payer: Self-pay | Admitting: Thoracic Surgery (Cardiothoracic Vascular Surgery)

## 2021-09-12 ENCOUNTER — Ambulatory Visit
Admission: RE | Admit: 2021-09-12 | Discharge: 2021-09-12 | Disposition: A | Discharge status: Home / Self Care | Payer: BC Managed Care – PPO | Source: Ambulatory Visit | Attending: Thoracic Surgery (Cardiothoracic Vascular Surgery) | Admitting: Thoracic Surgery (Cardiothoracic Vascular Surgery)

## 2021-09-12 ENCOUNTER — Other Ambulatory Visit: Payer: BC Managed Care – PPO

## 2021-09-12 ENCOUNTER — Ambulatory Visit (INDEPENDENT_AMBULATORY_CARE_PROVIDER_SITE_OTHER): Payer: BC Managed Care – PPO | Admitting: Thoracic Surgery (Cardiothoracic Vascular Surgery)

## 2021-09-12 DIAGNOSIS — I712 Thoracic aortic aneurysm, without rupture, unspecified: Secondary | ICD-10-CM | POA: Diagnosis not present

## 2021-09-12 DIAGNOSIS — R918 Other nonspecific abnormal finding of lung field: Secondary | ICD-10-CM

## 2021-09-12 DIAGNOSIS — S2232XA Fracture of one rib, left side, initial encounter for closed fracture: Secondary | ICD-10-CM | POA: Diagnosis not present

## 2021-09-12 DIAGNOSIS — I7121 Aneurysm of the ascending aorta, without rupture: Secondary | ICD-10-CM | POA: Diagnosis not present

## 2021-09-12 MED ORDER — IOPAMIDOL (ISOVUE-370) INJECTION 76%
75.0000 mL | Freq: Once | INTRAVENOUS | Status: AC | PRN
  Administered 2021-09-12: 75 mL via INTRAVENOUS

## 2021-09-12 NOTE — Progress Notes (Signed)
? ?   ?[address] ?      [place] [address] ?            [phone]   ? ?  ?HPI: [name] returns for a scheduled annual follow-up. ? ?[name] is a 66 year old man with a history of stage IV prostate cancer, ulcerative colitis, sleep apnea, hyperlipidemia, degenerative joint disease, empyema, an ascending aortic aneurysm, and probable bicuspid aortic valve. ? ?I did a left VATS for drainage of an empyema and decortication in March 2018.  On his CT he was noted to have a 4.1 cm ascending aneurysm.  He has had a known systolic heart murmur.  Dr. Sandi Mariscal did an echo back in October 2021 which showed probable bicuspid valve with moderate calcification and only mild aortic stenosis. ? ?He is being treated at [facility] for his stage IV prostate cancer.  He has known bone metastases.  He continues to work.  He is not having any chest pain, pressure, or tightness. ? ?Past Medical History:  ?Diagnosis Date  ? Allergy   ? Anxiety   ? Arthritis   ? Hepatic steatosis 2016  ? Hyperlipidemia   ? Intermittent depression   ? Metastatic cancer to bone [facility])   ? left rib.  ? Neoplasm of face   ? benign  ? Pneumonia   ? left lung with a loculated effusion   ? Prostate cancer ([place])   ? Recurrent HSV (herpes simplex virus)   ? Sleep apnea   ? Ulcerative colitis   ? ? ?Current Outpatient Medications  ?Medication Sig Dispense Refill  ? balsalazide (COLAZAL) 750 MG capsule TAKE 1 CAPSULE(750 MG) BY MOUTH TWICE DAILY 60 capsule 2  ? celecoxib (CELEBREX) 200 MG capsule Take 200 mg by mouth 2 (two) times daily.    ? Cholecalciferol (VITAMIN D) 2000 UNITS tablet Take 5,000 Units by mouth daily.    ? diazepam (VALIUM) 5 MG tablet Take 2.5 mg by mouth at bedtime as needed.    ? diphenhydrAMINE (BENADRYL) 25 MG tablet Take 25 mg by mouth as needed.    ? fish oil-omega-3 fatty acids 1000 MG capsule Take 2 g by mouth daily.    ? glucosamine-chondroitin 500-400 MG tablet Take 1 tablet by mouth 2 (two) times daily.    ?  Lycopene 10 MG CAPS Take 10 mg by mouth.    ? Multiple Vitamin (MULTIVITAMIN) tablet Take 1 tablet by mouth daily.    ? predniSONE (DELTASONE) 5 MG tablet   4  ? simvastatin (ZOCOR) 20 MG tablet Take 20 mg by mouth daily after supper.  1  ? valACYclovir (VALTREX) 500 MG tablet Take 500 mg by mouth 2 (two) times daily as needed. PRN    ? ZYTIGA 250 MG tablet 1,000 mg daily.   4  ? ?No current facility-administered medications for this visit.  ? ? ?Physical Exam ?BP 127/72 (BP Location: Left Arm, Patient Position: Sitting, Cuff Size: Normal)   Pulse 73   Resp 20   Ht '5\' 7"'$  (1.702 m)   Wt 182 lb 9.6 oz (82.8 kg)   SpO2 94% Comment: ra  BMI 28.15 kg/m?  ?66 year old man in no acute distress ?Alert and oriented x3 with no focal deficits ?Lungs clear with equal breath sounds bilaterally ?Cardiac regular rate and rhythm with a 2/6 systolic murmur ?No peripheral edema ? ?Diagnostic Tests: ?CT ANGIOGRAPHY CHEST WITH CONTRAST ?  ?TECHNIQUE: ?Multidetector CT imaging of the chest was performed using the ?  standard protocol during bolus administration of intravenous ?contrast. Multiplanar CT image reconstructions and MIPs were ?obtained to evaluate the vascular anatomy. ?  ?RADIATION DOSE REDUCTION: This exam was performed according to the ?departmental dose-optimization program which includes automated ?exposure control, adjustment of the mA and/or kV according to ?patient size and/or use of iterative reconstruction technique. ?  ?CONTRAST:  55m ISOVUE-370 IOPAMIDOL (ISOVUE-370) INJECTION 76% ?  ?COMPARISON:  CT a chest 09/06/2020 ?  ?FINDINGS: ?Cardiovascular: The ascending thoracic aorta is mildly aneurysmal ?measuring up to 4.2 cm on the current study, unchanged. There is no ?evidence of dissection. There is mild calcified atherosclerotic ?plaque of the thoracic aorta. There is mild calcification of the ?aortic valve. The heart is not enlarged. There is no pericardial ?effusion. The pulmonary vasculature is normal. ?   ?Mediastinum/Nodes: The thyroid is unremarkable. The esophagus is ?grossly unremarkable. There is no mediastinal, hilar, or axillary ?lymphadenopathy. ?  ?Lungs/Pleura: The trachea and central airways are patent. ?  ?Mild scarring in the left lower lobe is unchanged. There is no focal ?consolidation or pulmonary edema. There is no pleural effusion or ?pneumothorax. There is no suspicious nodule. ?  ?Upper Abdomen: A hypodense lesion in the left hepatic lobe is ?unchanged, likely a cyst. The imaged portions of the upper abdominal ?viscera are otherwise unremarkable. ?  ?Musculoskeletal: There is a subacute appearing fracture of the left ?anterior sixth rib (11-87). A sclerotic focus along the inferior ?endplate of the T7 vertebral body is unchanged since 2019. ?  ?Review of the MIP images confirms the above findings. ?  ?IMPRESSION: ?1. Stable 4.2 cm ascending thoracic aortic aneurysm. Recommend ?continued annual follow-up. ?2. Subacute appearing fracture of the left anterior sixth rib. ?  ?  ?Electronically Signed ?  By: PValetta MoleM.D. ?  On: 09/12/2021 16:21 ?  ?I personally reviewed the CT images.  There has been no significant interval change of the ascending aneurysm.  6th rib fracture corresponding to metastasis on bone scan. ? ?Impression: ?[name] a 66year old man with a history of stage IV prostate cancer, ulcerative colitis, sleep apnea, hyperlipidemia, degenerative joint disease, empyema, an ascending aortic aneurysm, and probable bicuspid aortic valve. ? ?Ascending aneurysm-stable at 4.2 cm.  Needs continued annual follow-up.  He is aware of importance of blood pressure control. ? ?Bicuspid aortic valve-moderate annular and leaflet calcification and mild AS on echocardiogram in October 2021.  We will plan to see him back in 6 months with a repeat echocardiogram to reassess the valve.  Not having any symptoms related to that currently. ? ? ? ?Plan: ?I will see him back in 6 months with a 2D  echocardiogram to follow-up his aortic valve ?We will need another CT angiogram in a year to follow-up his ascending aorta. ? ?I spent over 20 minutes in review of records, images, and in consultation with Mr. WHeavnertoday. ?SMelrose Nakayama MD ?[facility] ?([phone] ? ? ? ?

## 2021-09-14 ENCOUNTER — Ambulatory Visit: Payer: BC Managed Care – PPO | Admitting: Gastroenterology

## 2021-09-18 NOTE — Progress Notes (Unsigned)
09/18/2021 [name] [id number] 24-Nov-1955   ASSESSMENT AND PLAN:  *** There are no diagnoses linked to this encounter.   Patient Care Team: Derinda Late, MD as PCP - General (Family Medicine)  HISTORY OF PRESENT ILLNESS: 66 y.o. male referred by Derinda Late, MD, with a past medical history of ulcerative colitis left-sided diagnosed 2002, prostate cancer 2015 status post surgery and chemotherapy for metastatic disease, XRT for metastatic lesion rib, history of pneumonia requiring VATS procedure, thoracic aortic aneurysm 4.2 cm and others listed below presents for evaluation of colonoscopy.  Patient known to Dr. Havery Moros last seen 2018. On colas all 1.5 g twice daily. Colonoscopy 08/2014 - small adenoma of rectum, otherwise normal Colonoscopy 05/06/2017 for surveillance showed internal hemorrhoids and biopsies negative for active inflammation or dysplasia. Has been 5 years.   External labs and notes reviewed this visit: 07/20/2021 hemoglobin 13.5, platelets 263, MCV 94 BUN 22, creatinine 0.9, normal liver function.  PSA less than 0.01  Current Medications:   Current Outpatient Medications (Endocrine & Metabolic):    predniSONE (DELTASONE) 5 MG tablet,   Current Outpatient Medications (Cardiovascular):    simvastatin (ZOCOR) 20 MG tablet, Take 20 mg by mouth daily after supper.  Current Outpatient Medications (Respiratory):    diphenhydrAMINE (BENADRYL) 25 MG tablet, Take 25 mg by mouth as needed.  Current Outpatient Medications (Analgesics):    celecoxib (CELEBREX) 200 MG capsule, Take 200 mg by mouth 2 (two) times daily.   Current Outpatient Medications (Other):    balsalazide (COLAZAL) 750 MG capsule, TAKE 1 CAPSULE(750 MG) BY MOUTH TWICE DAILY   Cholecalciferol (VITAMIN D) 2000 UNITS tablet, Take 5,000 Units by mouth daily.   diazepam (VALIUM) 5 MG tablet, Take 2.5 mg by mouth at bedtime as needed.   fish oil-omega-3 fatty acids 1000 MG capsule, Take 2 g  by mouth daily.   glucosamine-chondroitin 500-400 MG tablet, Take 1 tablet by mouth 2 (two) times daily.   Lycopene 10 MG CAPS, Take 10 mg by mouth.   Multiple Vitamin (MULTIVITAMIN) tablet, Take 1 tablet by mouth daily.   valACYclovir (VALTREX) 500 MG tablet, Take 500 mg by mouth 2 (two) times daily as needed. PRN   ZYTIGA 250 MG tablet, 1,000 mg daily.   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   Neoplasm of face    benign   Pneumonia    left lung with a loculated effusion    Prostate cancer ([facility])    Recurrent HSV (herpes simplex virus)    Sleep apnea    Ulcerative colitis    Allergies:  Allergies  Allergen Reactions   Sulfamethoxazole     REACTION: unspecified     Surgical History:  He  has a past surgical history that includes Inguinal hernia repair; Shoulder Arthrotomy (Right); Prostatectomy (04/2015); Video assisted thoracoscopy (vats)/empyema (Left, 09/06/2016); Decortication (Left, 09/06/2016); and Video bronchoscopy (N/A, 09/06/2016). Family History:  His family history includes Breast cancer in his mother and another family member; Colon cancer in an other family member; Diabetes in an other family member; Heart attack in his father; Hyperlipidemia in an other family member; Hypertension in his father; Juvenile idiopathic arthritis in his daughter; Lung cancer in an other family member; Raynaud syndrome in his daughter; Thyroid  disease in an other family member. Social History:   reports that he has quit smoking. He has never used smokeless tobacco. He reports current alcohol use. He reports that he does not use drugs.  REVIEW OF SYSTEMS  : All other systems reviewed and negative except where noted in the History of Present Illness.   PHYSICAL EXAM: There were no vitals taken for this visit. General:    Pleasant, well developed male in no acute distress Head:  Normocephalic and atraumatic. Eyes: {sclerae:26738},conjunctive {conjuctiva:26739}  Heart:  {HEART EXAM HEM/ONC:21750} Pulm: Clear anteriorly; no wheezing Abdomen:  {BlankSingle:19197::"Distended","Ridged","Soft"}, {BlankSingle:19197::"Flat","Obese"} AB, skin exam {ABDOMEN SKIN EXAM:22649}, {BlankSingle:19197::"Absent","Hyperactive, tinkling","Hypoactive","Sluggish","Normal"} bowel sounds. {Desc; pc desc - abdomen tenderness:5168} tenderness {anatomy; site abdomen:5010}. {BlankMultiple:19196::"Without guarding","With guarding","Without rebound","With rebound"}, {Exam; abdomen organomegaly:15152}. Extremities:  {With/Without:304960234} edema. Msk:  Symmetrical without gross deformities. Peripheral pulses intact.  Neurologic:  Alert and  oriented x4;  grossly normal neurologically. Skin:   Dry and intact without significant lesions or rashes. Psychiatric: Demonstrates good judgement and reason without abnormal affect or behaviors.   Vladimir Crofts, PA-C 1:09 PM

## 2021-09-23 DIAGNOSIS — G4733 Obstructive sleep apnea (adult) (pediatric): Secondary | ICD-10-CM | POA: Diagnosis not present

## 2021-09-25 ENCOUNTER — Telehealth: Payer: Self-pay

## 2021-09-25 ENCOUNTER — Ambulatory Visit (INDEPENDENT_AMBULATORY_CARE_PROVIDER_SITE_OTHER): Payer: BC Managed Care – PPO | Admitting: Physician Assistant

## 2021-09-25 ENCOUNTER — Other Ambulatory Visit: Payer: Self-pay

## 2021-09-25 ENCOUNTER — Encounter: Payer: Self-pay | Admitting: Physician Assistant

## 2021-09-25 VITALS — BP 130/78 | HR 67 | Ht 66.0 in | Wt 184.2 lb

## 2021-09-25 DIAGNOSIS — K5130 Ulcerative (chronic) rectosigmoiditis without complications: Secondary | ICD-10-CM

## 2021-09-25 MED ORDER — PLENVU 140 G PO SOLR: 1.0000 | ORAL | 0 refills | Status: DC

## 2021-09-25 NOTE — Telephone Encounter (Signed)
-----   Message from Vladimir Crofts, [place] sent at 09/25/2021 12:40 PM EDT ----- ?Regarding: Colonoscopy ?Unknown if this goes to you or Melissa who I was working with today.  ?Patient due 04/2022 but will schedule sooner per Dr. Doyne Keel recommendation.  ?Please set up colonoscopy for UC with Dr. Havery Moros.  ?Thanks ?----- Message ----- ?From: Yetta Flock, MD ?Sent: 09/25/2021  12:05 PM EDT ?To: Vladimir Crofts, PA-C ? ? ? ? ?----- Message ----- ?From: Vladimir Crofts, PA-C ?Sent: 09/25/2021   9:44 AM EDT ?To: Yetta Flock, MD ? ?Discussed with patient that 5 years is acceptable with last 2 colons and length of UC, okay to schedule in oct or should be done sooner? I think patient just needs reassurance. Thanks ? ?

## 2021-09-25 NOTE — Progress Notes (Signed)
Glad to hear he is doing well. Given his longstanding colitis I think okay to do colonoscopy now to make sure okay and provide him piece of mind. Fortunately his colitis has been well controlled but given his longstanding disease I would schedule him for colonoscopy now, if you can let him know and help coordinate. Thanks ?

## 2021-09-25 NOTE — Telephone Encounter (Signed)
Patient has been scheduled for colon with Dr. Havery Moros on 5/24 at 8:30 am, pt is aware. Prep has been sent to pharmacy & instructions will be mailed home and mychart. ?

## 2021-09-25 NOTE — Patient Instructions (Signed)
Will discuss with Dr. Havery Moros.  ?

## 2021-10-10 DIAGNOSIS — M810 Age-related osteoporosis without current pathological fracture: Secondary | ICD-10-CM | POA: Diagnosis not present

## 2021-10-10 DIAGNOSIS — C61 Malignant neoplasm of prostate: Secondary | ICD-10-CM | POA: Diagnosis not present

## 2021-10-10 DIAGNOSIS — Z79818 Long term (current) use of other agents affecting estrogen receptors and estrogen levels: Secondary | ICD-10-CM | POA: Diagnosis not present

## 2021-10-18 DIAGNOSIS — C7951 Secondary malignant neoplasm of bone: Secondary | ICD-10-CM | POA: Diagnosis not present

## 2021-10-18 DIAGNOSIS — R030 Elevated blood-pressure reading, without diagnosis of hypertension: Secondary | ICD-10-CM | POA: Diagnosis not present

## 2021-10-18 DIAGNOSIS — R232 Flushing: Secondary | ICD-10-CM | POA: Diagnosis not present

## 2021-10-18 DIAGNOSIS — C61 Malignant neoplasm of prostate: Secondary | ICD-10-CM | POA: Diagnosis not present

## 2021-10-18 DIAGNOSIS — M47812 Spondylosis without myelopathy or radiculopathy, cervical region: Secondary | ICD-10-CM | POA: Diagnosis not present

## 2021-10-18 DIAGNOSIS — Z79818 Long term (current) use of other agents affecting estrogen receptors and estrogen levels: Secondary | ICD-10-CM | POA: Diagnosis not present

## 2021-10-18 DIAGNOSIS — Z7952 Long term (current) use of systemic steroids: Secondary | ICD-10-CM | POA: Diagnosis not present

## 2021-10-18 DIAGNOSIS — Z87891 Personal history of nicotine dependence: Secondary | ICD-10-CM | POA: Diagnosis not present

## 2021-10-18 DIAGNOSIS — Z79899 Other long term (current) drug therapy: Secondary | ICD-10-CM | POA: Diagnosis not present

## 2021-11-22 ENCOUNTER — Encounter: Payer: Self-pay | Admitting: Gastroenterology

## 2021-11-27 ENCOUNTER — Other Ambulatory Visit: Payer: Self-pay

## 2021-11-27 NOTE — Progress Notes (Unsigned)
Opened in error

## 2021-11-29 ENCOUNTER — Encounter: Payer: Self-pay | Admitting: Gastroenterology

## 2021-11-29 ENCOUNTER — Ambulatory Visit (AMBULATORY_SURGERY_CENTER): Payer: BC Managed Care – PPO | Admitting: Gastroenterology

## 2021-11-29 VITALS — BP 95/39 | HR 65 | Temp 97.5°F | Resp 15 | Ht 66.0 in | Wt 184.0 lb

## 2021-11-29 DIAGNOSIS — K5130 Ulcerative (chronic) rectosigmoiditis without complications: Secondary | ICD-10-CM | POA: Diagnosis not present

## 2021-11-29 DIAGNOSIS — K529 Noninfective gastroenteritis and colitis, unspecified: Secondary | ICD-10-CM | POA: Diagnosis not present

## 2021-11-29 DIAGNOSIS — K648 Other hemorrhoids: Secondary | ICD-10-CM | POA: Diagnosis not present

## 2021-11-29 DIAGNOSIS — K6289 Other specified diseases of anus and rectum: Secondary | ICD-10-CM | POA: Diagnosis not present

## 2021-11-29 MED ORDER — SODIUM CHLORIDE 0.9 % IV SOLN: 500.0000 mL | Freq: Once | INTRAVENOUS | Status: DC

## 2021-11-29 NOTE — Patient Instructions (Signed)
Handouts on Ulcerative Colitis and hemorrhoids given.  Await pathology results.  YOU HAD AN ENDOSCOPIC PROCEDURE TODAY AT [facility]:   Refer to the procedure report that was given to you for any specific questions about what was found during the examination.  If the procedure report does not answer your questions, please call your gastroenterologist to clarify.  If you requested that your care partner not be given the details of your procedure findings, then the procedure report has been included in a sealed envelope for you to review at your convenience later.  YOU SHOULD EXPECT: Some feelings of bloating in the abdomen. Passage of more gas than usual.  Walking can help get rid of the air that was put into your GI tract during the procedure and reduce the bloating. If you had a lower endoscopy (such as a colonoscopy or flexible sigmoidoscopy) you may notice spotting of blood in your stool or on the toilet paper. If you underwent a bowel prep for your procedure, you may not have a normal bowel movement for a few days.  Please Note:  You might notice some irritation and congestion in your nose or some drainage.  This is from the oxygen used during your procedure.  There is no need for concern and it should clear up in a day or so.  SYMPTOMS TO REPORT IMMEDIATELY:  Following lower endoscopy (colonoscopy or flexible sigmoidoscopy):  Excessive amounts of blood in the stool  Significant tenderness or worsening of abdominal pains  Swelling of the abdomen that is new, acute  Fever of 100F or higher   For urgent or emergent issues, a gastroenterologist can be reached at any hour by calling [phone]. Do not use MyChart messaging for urgent concerns.    DIET:  We do recommend a small meal at first, but then you may proceed to your regular diet.  Drink plenty of fluids but you should avoid alcoholic beverages for 24 hours.  ACTIVITY:  You should plan to take it easy for the  rest of today and you should NOT DRIVE or use heavy machinery until tomorrow (because of the sedation medicines used during the test).    FOLLOW UP: Our staff will call the number listed on your records 48-72 hours following your procedure to check on you and address any questions or concerns that you may have regarding the information given to you following your procedure. If we do not reach you, we will leave a message.  We will attempt to reach you two times.  During this call, we will ask if you have developed any symptoms of COVID 19. If you develop any symptoms (ie: fever, flu-like symptoms, shortness of breath, cough etc.) before then, please call [phone].  If you test positive for Covid 19 in the [address] [place] post procedure, please call and report this information to [place].    If any biopsies were taken you will be contacted by phone or by letter within the next 1-3 [place].  Please call us at [phone] if you have not heard about the biopsies in [address] [place].    SIGNATURES/CONFIDENTIALITY: You and/or your care partner have signed paperwork which will be entered into your electronic medical record.  These signatures attest to the fact that that the information above on your After Visit Summary has been reviewed and is understood.  Full responsibility of the confidentiality of this discharge information lies with you and/or your care-partner.

## 2021-11-29 NOTE — Progress Notes (Signed)
[facility] History and Physical   Primary Care Physician:  Derinda Late, MD   Reason for Procedure:   UC - left sided  Plan:    colonoscopy     HPI: [name] is a 66 y.o. male  here for colonoscopy surveillance of left sided UC, overdue for surveillance. Patient denies any bowel symptoms at this time. On Colazol. Otherwise feels well without any cardiopulmonary symptoms. I have discussed risks  benefits of colonoscopy and anesthesia and he wants to proceed.   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   Neoplasm of face    benign   Pneumonia    left lung with a loculated effusion    Prostate cancer ([facility])    Recurrent HSV (herpes simplex virus)    Sleep apnea    Ulcerative colitis     Past Surgical History:  Procedure Laterality Date   DECORTICATION Left 09/06/2016   Procedure: DECORTICATION;  Surgeon: [name], MD;  Location: [facility];  Service: Thoracic;  Laterality: Left;   INGUINAL HERNIA REPAIR     PROSTATECTOMY  04/2015   SHOULDER ARTHROTOMY Right    VIDEO ASSISTED THORACOSCOPY (VATS)/EMPYEMA Left 09/06/2016   Procedure: VIDEO ASSISTED THORACOSCOPY (VATS)/DRAIN EMPYEMA;  Surgeon: [name], MD;  Location: [facility];  Service: Thoracic;  Laterality: Left;   VIDEO BRONCHOSCOPY N/A 09/06/2016   Procedure: VIDEO BRONCHOSCOPY;  Surgeon: [name], MD;  Location: [facility];  Service: Thoracic;  Laterality: N/A;    Prior to Admission medications   Medication Sig Start Date End Date Taking? Authorizing Provider  balsalazide (COLAZAL) 750 MG capsule TAKE 1 CAPSULE(750 MG) BY MOUTH TWICE DAILY 08/05/18  Yes Betzaira Mentel, Carlota Raspberry, MD  celecoxib (CELEBREX) 200 MG capsule Take 200 mg by mouth 2 (two) times daily.   Yes [provider]  Cholecalciferol (VITAMIN D) 2000 UNITS tablet  Take 5,000 Units by mouth daily.   Yes [provider]  diazepam (VALIUM) 5 MG tablet Take 2.5 mg by mouth at bedtime as needed. 03/25/20  Yes [provider]  fish oil-omega-3 fatty acids 1000 MG capsule Take 2 g by mouth daily.   Yes [provider]  glucosamine-chondroitin 500-400 MG tablet Take 1 tablet by mouth 2 (two) times daily.   Yes [provider]  Lycopene 10 MG CAPS Take 10 mg by mouth.   Yes [provider]  Multiple Vitamin (MULTIVITAMIN) tablet Take 1 tablet by mouth daily.   Yes [provider]  predniSONE (DELTASONE) 5 MG tablet  06/25/17  Yes [provider]  simvastatin (ZOCOR) 20 MG tablet Take 20 mg by mouth daily after supper. 07/31/16  Yes [provider]  ZYTIGA 250 MG tablet 1,000 mg daily.  06/25/17  Yes [provider]  diphenhydrAMINE (BENADRYL) 25 MG tablet Take 25 mg by mouth as needed.    [provider]  valACYclovir (VALTREX) 500 MG tablet Take 500 mg by mouth 2 (two) times daily as needed. PRN    [provider]    Current Outpatient Medications  Medication Sig Dispense Refill   balsalazide (COLAZAL) 750 MG capsule TAKE 1 CAPSULE(750 MG) BY MOUTH TWICE DAILY 60 capsule 2   celecoxib (CELEBREX) 200 MG capsule Take 200 mg by mouth 2 (two)  times daily.     Cholecalciferol (VITAMIN D) 2000 UNITS tablet Take 5,000 Units by mouth daily.     diazepam (VALIUM) 5 MG tablet Take 2.5 mg by mouth at bedtime as needed.     fish oil-omega-3 fatty acids 1000 MG capsule Take 2 g by mouth daily.     glucosamine-chondroitin 500-400 MG tablet Take 1 tablet by mouth 2 (two) times daily.     Lycopene 10 MG CAPS Take 10 mg by mouth.     Multiple Vitamin (MULTIVITAMIN) tablet Take 1 tablet by mouth daily.     predniSONE (DELTASONE) 5 MG tablet   4   simvastatin (ZOCOR) 20 MG tablet Take 20 mg by mouth daily after supper.  1   ZYTIGA 250 MG tablet 1,000 mg daily.   4   diphenhydrAMINE  (BENADRYL) 25 MG tablet Take 25 mg by mouth as needed.     valACYclovir (VALTREX) 500 MG tablet Take 500 mg by mouth 2 (two) times daily as needed. PRN     Current Facility-Administered Medications  Medication Dose Route Frequency Provider Last Rate Last Admin   0.9 %  sodium chloride infusion  500 mL Intravenous Once Dayanara Sherrill, Carlota Raspberry, MD        Allergies as of 11/29/2021 - Review Complete 11/29/2021  Allergen Reaction Noted   Sulfamethoxazole  08/07/2006    Family History  Problem Relation Age of Onset   Heart attack Father    Hypertension Father    Hyperlipidemia Other    Diabetes Other    Thyroid disease Other    Lung cancer Other    Breast cancer Other    Colon cancer Other    Juvenile idiopathic arthritis Daughter    Raynaud syndrome Daughter    Breast cancer Mother     Social History   Socioeconomic History   Marital status: Legally Separated    Spouse name: Not on file   Number of children: 3   Years of education: Not on file   Highest education level: Not on file  Occupational History   Occupation: Charity fundraiser  Tobacco Use   Smoking status: Former   Smokeless tobacco: Never   Tobacco comments:    quit 1992  Vaping Use   Vaping Use: Never used  Substance and Sexual Activity   Alcohol use: Yes    Alcohol/week: 0.0 standard drinks   Drug use: No   Sexual activity: Not on file  Other Topics Concern   Not on file  Social History Narrative   [facility] (09/04/16):   Currently he owns a flooring company. Questionable asbestos exposure. No mold exposure. Originally from [place]. Does have a dog but no bird exposure.   Social Determinants of Health   Financial Resource Strain: Not on file  Food Insecurity: Not on file  Transportation Needs: Not on file  Physical Activity: Not on file  Stress: Not on file  Social Connections: Not on file  Intimate Partner Violence: Not on file    Review of Systems: All other review of systems  negative except as mentioned in the HPI.  Physical Exam: Vital signs BP (!) 117/91   Pulse 85   Temp (!) 97.5 F (36.4 C) (Temporal)   Ht '5\' 6"'$  (1.676 m)   Wt 184 lb (83.5 kg)   SpO2 97%   BMI 29.70 kg/m   General:   Alert,  Well-developed, pleasant and cooperative in NAD Lungs:  Clear throughout to auscultation.   Heart:  Regular rate and rhythm Abdomen:  Soft, nontender and nondistended.   Neuro/Psych:  Alert and cooperative. Normal mood and affect. A and O x 3  Jolly Mango, MD [facility]

## 2021-11-29 NOTE — Progress Notes (Signed)
Called to room to assist during endoscopic procedure.  Patient ID and intended procedure confirmed with present staff. Received instructions for my participation in the procedure from the performing physician.  

## 2021-11-29 NOTE — Progress Notes (Signed)
Pt's states no medical or surgical changes since previsit or office visit. 

## 2021-11-29 NOTE — Progress Notes (Signed)
Pt non-responsive, VVS, Report to RN  °

## 2021-11-29 NOTE — Op Note (Signed)
[facility] Patient Name: [name] Procedure Date: 11/29/2021 8:34 AM MRN: [id number] Endoscopist: Remo Lipps P. Havery Moros , MD Age: 66 Referring MD:  Date of Birth: 1956/01/07 Gender: Male Account #: [id number] Procedure:                Colonoscopy Indications:              surveillance of left sided ulcerative colitis - on                            balsalazide with good control of symptoms Medicines:                Monitored Anesthesia Care Procedure:                Pre-Anesthesia Assessment:                           - Prior to the procedure, a History and Physical                            was performed, and patient medications and                            allergies were reviewed. The patient's tolerance of                            previous anesthesia was also reviewed. The risks                            and benefits of the procedure and the sedation                            options and risks were discussed with the patient.                            All questions were answered, and informed consent                            was obtained. Prior Anticoagulants: The patient has                            taken no previous anticoagulant or antiplatelet                            agents. ASA Grade Assessment: III - A patient with                            severe systemic disease. After reviewing the risks                            and benefits, the patient was deemed in                            satisfactory condition to undergo the procedure.  After obtaining informed consent, the colonoscope                            was passed under direct vision. Throughout the                            procedure, the patient's blood pressure, pulse, and                            oxygen saturations were monitored continuously. The                            Olympus CF-HQ190L (Serial# [id number]) Colonoscope was                            introduced  through the anus and advanced to the the                            terminal ileum, with identification of the                            appendiceal orifice and IC valve. The colonoscopy                            was performed without difficulty. The patient                            tolerated the procedure well. The quality of the                            bowel preparation was good. The terminal ileum,                            ileocecal valve, appendiceal orifice, and rectum                            were photographed. Scope In: 8:55:06 AM Scope Out: 9:13:03 AM Scope Withdrawal Time: 0 hours 14 minutes 23 seconds  Total Procedure Duration: 0 hours 17 minutes 57 seconds  Findings:                 The perianal and digital rectal examinations were                            normal.                           The terminal ileum appeared normal.                           Internal hemorrhoids were found during retroflexion.                           Small anal papilla(e) were hypertrophied.  The exam was otherwise without abnormality. No                            inflammatory changes.                           Biopsies were taken with a cold forceps from the                            descending colon, sigmoid colon and rectum. These                            biopsy specimens were sent to Pathology. Complications:            No immediate complications. Estimated blood loss:                            Minimal. Estimated Blood Loss:     Estimated blood loss was minimal. Impression:               - The examined portion of the ileum was normal.                           - Internal hemorrhoids.                           - Small anal papilla(e) were hypertrophied.                           - The examination was otherwise normal.                           - Biopsies for surveillance were taken from the                            descending colon, sigmoid colon and  rectum. Recommendation:           - Patient has a contact number available for                            emergencies. The signs and symptoms of potential                            delayed complications were discussed with the                            patient. Return to normal activities tomorrow.                            Written discharge instructions were provided to the                            patient.                           - Resume previous diet.                           -  Continue present medications.                           - Await pathology results. Remo Lipps P. Tamrah Victorino, MD 11/29/2021 9:18:18 AM This report has been signed electronically.

## 2021-11-30 ENCOUNTER — Telehealth: Payer: Self-pay

## 2021-11-30 NOTE — Telephone Encounter (Signed)
  Follow up Call-     11/29/2021    7:50 AM  Call back number  Post procedure Call Back phone  # [phone]  Permission to leave phone message Yes     Patient questions:  Do you have a fever, pain , or abdominal swelling? No. Pain Score  0 *  Have you tolerated food without any problems? Yes.    Have you been able to return to your normal activities? Yes.    Do you have any questions about your discharge instructions: Diet   No. Medications  No. Follow up visit  No.  Do you have questions or concerns about your Care? No.  Actions: * If pain score is 4 or above: No action needed, pain <4.

## 2021-12-06 ENCOUNTER — Other Ambulatory Visit: Payer: Self-pay

## 2021-12-06 DIAGNOSIS — K5130 Ulcerative (chronic) rectosigmoiditis without complications: Secondary | ICD-10-CM

## 2022-01-10 ENCOUNTER — Other Ambulatory Visit: Payer: Self-pay | Admitting: Family Medicine

## 2022-01-10 ENCOUNTER — Ambulatory Visit
Admission: RE | Admit: 2022-01-10 | Discharge: 2022-01-10 | Disposition: A | Discharge status: Home / Self Care | Payer: BC Managed Care – PPO | Source: Ambulatory Visit | Attending: Family Medicine | Admitting: Family Medicine

## 2022-01-10 DIAGNOSIS — W19XXXA Unspecified fall, initial encounter: Secondary | ICD-10-CM

## 2022-01-10 DIAGNOSIS — S20219A Contusion of unspecified front wall of thorax, initial encounter: Secondary | ICD-10-CM | POA: Diagnosis not present

## 2022-01-10 DIAGNOSIS — S299XXA Unspecified injury of thorax, initial encounter: Secondary | ICD-10-CM | POA: Diagnosis not present

## 2022-01-18 DIAGNOSIS — M542 Cervicalgia: Secondary | ICD-10-CM | POA: Diagnosis not present

## 2022-01-18 DIAGNOSIS — C7951 Secondary malignant neoplasm of bone: Secondary | ICD-10-CM | POA: Diagnosis not present

## 2022-01-18 DIAGNOSIS — R232 Flushing: Secondary | ICD-10-CM | POA: Diagnosis not present

## 2022-01-18 DIAGNOSIS — Z79818 Long term (current) use of other agents affecting estrogen receptors and estrogen levels: Secondary | ICD-10-CM | POA: Diagnosis not present

## 2022-01-18 DIAGNOSIS — C61 Malignant neoplasm of prostate: Secondary | ICD-10-CM | POA: Diagnosis not present

## 2022-01-18 DIAGNOSIS — Z79899 Other long term (current) drug therapy: Secondary | ICD-10-CM | POA: Diagnosis not present

## 2022-02-12 ENCOUNTER — Other Ambulatory Visit: Payer: Self-pay | Admitting: Thoracic Surgery (Cardiothoracic Vascular Surgery)

## 2022-02-12 DIAGNOSIS — I7121 Aneurysm of the ascending aorta, without rupture: Secondary | ICD-10-CM

## 2022-02-20 ENCOUNTER — Encounter (HOSPITAL_COMMUNITY): Payer: Self-pay | Admitting: Thoracic Surgery (Cardiothoracic Vascular Surgery)

## 2022-03-14 ENCOUNTER — Other Ambulatory Visit (HOSPITAL_COMMUNITY): Payer: BC Managed Care – PPO

## 2022-03-22 DIAGNOSIS — H40013 Open angle with borderline findings, low risk, bilateral: Secondary | ICD-10-CM | POA: Diagnosis not present

## 2022-03-23 ENCOUNTER — Ambulatory Visit (HOSPITAL_COMMUNITY): Payer: BC Managed Care – PPO | Attending: Thoracic Surgery (Cardiothoracic Vascular Surgery)

## 2022-03-23 DIAGNOSIS — I7121 Aneurysm of the ascending aorta, without rupture: Secondary | ICD-10-CM | POA: Insufficient documentation

## 2022-03-23 LAB — ECHOCARDIOGRAM COMPLETE
AR max vel: 1.44 cm2
AV Area VTI: 1.51 cm2
AV Area mean vel: 1.54 cm2
AV Mean grad: 11 mmHg
AV Peak grad: 21.7 mmHg
Ao pk vel: 2.33 m/s
Area-P 1/2: 3.39 cm2
S' Lateral: 2.9 cm

## 2022-03-27 ENCOUNTER — Ambulatory Visit (INDEPENDENT_AMBULATORY_CARE_PROVIDER_SITE_OTHER): Payer: BC Managed Care – PPO | Admitting: Thoracic Surgery (Cardiothoracic Vascular Surgery)

## 2022-03-27 VITALS — BP 104/71 | HR 75 | Resp 20 | Ht 66.0 in | Wt 181.0 lb

## 2022-03-27 DIAGNOSIS — Q231 Congenital insufficiency of aortic valve: Secondary | ICD-10-CM

## 2022-03-27 DIAGNOSIS — I7121 Aneurysm of the ascending aorta, without rupture: Secondary | ICD-10-CM

## 2022-03-27 NOTE — Progress Notes (Signed)
[address]       [place] [address]             [phone]    HPI: [name] returns for follow-up of his bicuspid aortic valve with moderate aortic stenosis.  [name] is a 66 year old man with a history of stage IV prostate cancer, ulcerative colitis, sleep apnea, hyperlipidemia, degenerative joint disease, empyema, ascending aneurysm, and bicuspid aortic valve with moderate AS.  I did a left VATS for drainage of an empyema in March 2018.  On CT he was noted to have a 4.1 cm ascending aneurysm.  I have followed him since then.  In 2021 he had a echo for systolic heart murmur.  It showed a bicuspid valve with moderate calcification and mild AS.  I last saw him in the office in March.  He was doing well at that time.  His aneurysm was stable at 4.2 cm.  I recommended a follow-up with an echocardiogram in 6 months.  He has been feeling well.  He says he does get tired easily.  He is on medications for his prostate cancer.  He says that he was told he has no signs of disease currently.  Current Outpatient Medications  Medication Sig Dispense Refill   balsalazide (COLAZAL) 750 MG capsule TAKE 1 CAPSULE(750 MG) BY MOUTH TWICE DAILY 60 capsule 2   celecoxib (CELEBREX) 200 MG capsule Take 200 mg by mouth 2 (two) times daily.     Cholecalciferol (VITAMIN D) 2000 UNITS tablet Take 5,000 Units by mouth daily.     diazepam (VALIUM) 5 MG tablet Take 2.5 mg by mouth at bedtime as needed.     diphenhydrAMINE (BENADRYL) 25 MG tablet Take 25 mg by mouth as needed.     fish oil-omega-3 fatty acids 1000 MG capsule Take 2 g by mouth daily.     glucosamine-chondroitin 500-400 MG tablet Take 1 tablet by mouth 2 (two) times daily.     Lycopene 10 MG CAPS Take 10 mg by mouth.     Multiple Vitamin (MULTIVITAMIN) tablet Take 1 tablet by mouth daily.     predniSONE (DELTASONE) 5 MG tablet   4   simvastatin (ZOCOR) 20 MG tablet Take 20 mg by mouth daily after supper.  1    valACYclovir (VALTREX) 500 MG tablet Take 500 mg by mouth 2 (two) times daily as needed. PRN     ZYTIGA 250 MG tablet 1,000 mg daily.   4   No current facility-administered medications for this visit.    Physical Exam BP 104/71 (BP Location: Left Arm, Patient Position: Sitting)   Pulse 75   Resp 20   Ht '5\' 6"'$  (1.676 m)   Wt 181 lb (82.1 kg)   SpO2 96% Comment: RA  BMI 29.62 kg/m  66 year old man in no acute distress Alert and oriented x3 with no focal deficits Lungs clear bilaterally Cardiac regular rate and rhythm with a 2/6 systolic murmur No peripheral edema  Diagnostic Tests: Echocardiogram 03/23/2022 IMPRESSIONS     1. Left ventricular ejection fraction, by estimation, is 60 to 65%. The  left ventricle has normal function. The left ventricle has no regional  wall motion abnormalities. There is mild concentric left ventricular  hypertrophy. Left ventricular diastolic  parameters are consistent with Grade I diastolic dysfunction (impaired  relaxation).   2. Right ventricular systolic function is normal. The right ventricular  size is normal.   3. The mitral valve is normal  in structure. Mild mitral valve  regurgitation.   4. The aortic valve is bicuspid. There is moderate calcification of the  aortic valve. There is moderate thickening of the aortic valve. Aortic  valve regurgitation is not visualized. Mild to moderate aortic valve  stenosis.   5. Aortic dilatation noted. There is mild dilatation of the aortic root,  measuring 44 mm. There is mild dilatation of the ascending aorta,  measuring 43 mm.   I personally reviewed the echocardiogram images and concur with the findings of a bicuspid valve with calcification and mild to moderate AS.  I also reviewed the images with [name].  Impression: [name] is a 66 year old man with a history of stage IV prostate cancer, ulcerative colitis, sleep apnea, hyperlipidemia, degenerative joint disease, empyema, ascending  aneurysm, and bicuspid aortic valve with mild to moderate AS.  Bicuspid aortic valve with AS-current echo shows mean gradient of 11 and peak gradient of 21 mmHg.  Aortic valve area 1.51 cm.  (Comparison 2021 mean 9 and peak 16 mmHg, valve area 2.91 cm).  We will check another echo in 6 months when he returns for follow-up of his aneurysm.  Ascending aneurysm-measured 43 mm on echo.  CT is more accurate.  He will be due for another CT in 6 months.  Stage IV prostate cancer-was told there was no current evidence of disease.  Remains on treatment.  Plan: In 6 months with CT angio of chest and 2D echocardiogram  [name], MD [facility] [phone]

## 2022-04-06 ENCOUNTER — Other Ambulatory Visit: Payer: Self-pay | Admitting: Family Medicine

## 2022-04-06 ENCOUNTER — Ambulatory Visit
Admission: RE | Admit: 2022-04-06 | Discharge: 2022-04-06 | Disposition: A | Discharge status: Home / Self Care | Payer: BC Managed Care – PPO | Source: Ambulatory Visit | Attending: Family Medicine | Admitting: Family Medicine

## 2022-04-06 DIAGNOSIS — M79631 Pain in right forearm: Secondary | ICD-10-CM

## 2022-04-12 DIAGNOSIS — G4733 Obstructive sleep apnea (adult) (pediatric): Secondary | ICD-10-CM | POA: Diagnosis not present

## 2022-04-16 NOTE — Progress Notes (Unsigned)
PATIENT: [name] DOB: 12/08/1955  REASON FOR VISIT: follow up HISTORY FROM: patient  No chief complaint on file.   HISTORY OF PRESENT ILLNESS:  04/16/22 ALL: [name] returns for follow up for OSA on CPAP. He is doing well. He is using CPAp nightly for about   04/13/2021 ALL: [name] returns for follow up for OSA on CPAP. He continues to do well with CPAP. He is using it every night. He admits there are nights where he may remove early or fall asleep before starting therapy. He feels fatigue is better. He continues to work in [employer]. He continues close follow up with PCP and urology. He is feeling well, today, and without concerns.     04/13/2020 ALL:  [name] is a 66 y.o. male here today for follow up for OSA on CPAP. He is doing very well on CPAP. He uses his machine every night. He does continue to suffer from fatigue and relates this to prostate cancer treatments. He is followed closely by PCP and [facility] oncology.   Compliance Report dated 03/13/2020 through 04/11/2020 reveals that he used CPAP thirty of the past 30 days for compliance of 100%.  The CPAP greater than 4 hours twenty-seven of the past 30 days for compliance of 90%.  Average usage on days used was 6 hours and 34 minutes.  Residual AHI was 2.3 on 5 to 13 cm of water and an EPR of three.  There was no significant leak noted.  HISTORY: (copied from my note on 04/13/2019)  [name] is a 66 y.o. male here today for follow up for OSA on CPAP.  He is doing very well on CPAP therapy.  He recently received his new CPAP machine and reports that he cannot sleep without it.  He is very happy with his new machine.   Compliance report dated 03/10/2019 3930 2020 reveals that he is using CPAP every night for compliance 100%.  29 of the last 39 CPAP greater than 4 hours for compliance of 97%.  Average usage was 7 hours and 5 minutes.  AHI was 2.4 on 5 to 13 cm of water and an EPR of 3.  There was no significant leak  noted.   HISTORY: (copied from [place] note on 04/09/2018)   [name] is a 66 year old male with a history of obstructive sleep apnea on CPAP.  He returns today for follow-up.  His CPAP download indicates that he uses machine 21 out of 30 days for compliance of 70%.  He uses machine greater than 4 hours 17 days for compliance of 57%.  On average he uses his machine 5 hours and 44 minutes.  His residual AHI is 2.4 on 5 to 13 cm of water with EPR of 3.  He does not have a significant leak.  He states that there is some nights that he is exhausted and just simply does not put the machine on.  His Epworth sleepiness score is 8.  He is being followed at [facility] for prostate cancer.  He notes that since he started treatment he is noticed some changes in his memory.  According to their note they have refer the patient to neuropsychology for evaluation.  He returns today for evaluation.     HISTORY 04/09/17: [name] is a 66 year old male with history of obstructive sleep Apnea on CPAP. He returns today for a compliance download. His download shows that he uses his machine 30/30  days for a compliance of 100%. He used his machine >4 hours 30/30 nights. On average he uses his machine regularly every night.  He currently is using ResMed AirFit10 medium mask. Patient reports that he changes out his supplies approximately every 6 months. Overall patient feels that CPAP has improved his sleepiness and fatigue. Since the last visit the patient denies any new symptoms.  REVIEW OF SYSTEMS: Out of a complete 14 system review of symptoms, the patient complains only of the following symptoms, fatigue and all other reviewed systems are negative.  ESS: 4  ALLERGIES: Allergies  Allergen Reactions   Sulfamethoxazole     REACTION: unspecified    HOME MEDICATIONS: Outpatient Medications Prior to Visit  Medication Sig Dispense Refill   balsalazide (COLAZAL) 750 MG capsule TAKE 1 CAPSULE(750 MG) BY MOUTH  TWICE DAILY 60 capsule 2   celecoxib (CELEBREX) 200 MG capsule Take 200 mg by mouth 2 (two) times daily.     Cholecalciferol (VITAMIN D) 2000 UNITS tablet Take 5,000 Units by mouth daily.     diazepam (VALIUM) 5 MG tablet Take 2.5 mg by mouth at bedtime as needed.     diphenhydrAMINE (BENADRYL) 25 MG tablet Take 25 mg by mouth as needed.     fish oil-omega-3 fatty acids 1000 MG capsule Take 2 g by mouth daily.     glucosamine-chondroitin 500-400 MG tablet Take 1 tablet by mouth 2 (two) times daily.     Lycopene 10 MG CAPS Take 10 mg by mouth.     Multiple Vitamin (MULTIVITAMIN) tablet Take 1 tablet by mouth daily.     predniSONE (DELTASONE) 5 MG tablet   4   simvastatin (ZOCOR) 20 MG tablet Take 20 mg by mouth daily after supper.  1   valACYclovir (VALTREX) 500 MG tablet Take 500 mg by mouth 2 (two) times daily as needed. PRN     ZYTIGA 250 MG tablet 1,000 mg daily.   4   No facility-administered medications prior to visit.    PAST MEDICAL HISTORY: Past Medical History:  Diagnosis Date   Allergy    Anxiety    Arthritis    Hepatic steatosis 2016   Hyperlipidemia    Intermittent depression    Metastatic cancer to bone ([place])    left rib.   Neoplasm of face    benign   Pneumonia    left lung with a loculated effusion    Prostate cancer ([place])    Recurrent HSV (herpes simplex virus)    Sleep apnea    Ulcerative colitis     PAST SURGICAL HISTORY: Past Surgical History:  Procedure Laterality Date   DECORTICATION Left 09/06/2016   Procedure: DECORTICATION;  Surgeon: [name], MD;  Location: [facility];  Service: Thoracic;  Laterality: Left;   INGUINAL HERNIA REPAIR     PROSTATECTOMY  04/2015   SHOULDER ARTHROTOMY Right    VIDEO ASSISTED THORACOSCOPY (VATS)/EMPYEMA Left 09/06/2016   Procedure: VIDEO ASSISTED THORACOSCOPY (VATS)/DRAIN EMPYEMA;  Surgeon: [name], MD;  Location: [facility];  Service: Thoracic;  Laterality: Left;   VIDEO BRONCHOSCOPY N/A 09/06/2016    Procedure: VIDEO BRONCHOSCOPY;  Surgeon: [name], MD;  Location[facility];  Service: Thoracic;  Laterality: N/A;    FAMILY HISTORY: Family History  Problem Relation Age of Onset   Heart attack Father    Hypertension Father    Hyperlipidemia Other    Diabetes Other    Thyroid disease Other    Lung cancer Other  Breast cancer Other    Colon cancer Other    Juvenile idiopathic arthritis Daughter    Raynaud syndrome Daughter    Breast cancer Mother     SOCIAL HISTORY: Social History   Socioeconomic History   Marital status: Legally Separated    Spouse name: Not on file   Number of children: 3   Years of education: Not on file   Highest education level: Not on file  Occupational History   Occupation: Charity fundraiser  Tobacco Use   Smoking status: Former   Smokeless tobacco: Never   Tobacco comments:    quit 1992  Vaping Use   Vaping Use: Never used  Substance and Sexual Activity   Alcohol use: Yes    Alcohol/week: 0.0 standard drinks of alcohol   Drug use: No   Sexual activity: Not on file  Other Topics Concern   Not on file  Social History Narrative   [facility] (09/04/16):   Currently he owns a flooring company. Questionable asbestos exposure. No mold exposure. Originally from [place]. Does have a dog but no bird exposure.   Social Determinants of Health   Financial Resource Strain: Not on file  Food Insecurity: Not on file  Transportation Needs: Not on file  Physical Activity: Not on file  Stress: Not on file  Social Connections: Not on file  Intimate Partner Violence: Not on file      PHYSICAL EXAM  There were no vitals filed for this visit.   There is no height or weight on file to calculate BMI.  Generalized: Well developed, in no acute distress  Cardiology: normal rate and rhythm, no murmur noted Respiratory: clear to auscultation bilaterally  Neurological examination  Mentation: Alert oriented to time, place,  history taking. Follows all commands speech and language fluent Cranial nerve II-XII: Pupils were equal round reactive to light. Extraocular movements were full, visual field were full  Motor: The motor testing reveals 5 over 5 strength of all 4 extremities. Good symmetric motor tone is noted throughout.  Gait and station: Gait is normal.   DIAGNOSTIC DATA (LABS, IMAGING, TESTING) - I reviewed patient records, labs, notes, testing and imaging myself where available.      No data to display           Lab Results  Component Value Date   WBC 9.7 09/09/2016   HGB 8.9 (L) 09/09/2016   HCT 27.8 (L) 09/09/2016   MCV 94.2 09/09/2016   PLT 474 (H) 09/09/2016      Component Value Date/Time   NA 140 09/11/2016 0250   K 4.0 09/11/2016 0250   CL 102 09/11/2016 0250   CO2 29 09/11/2016 0250   GLUCOSE 115 (H) 09/11/2016 0250   GLUCOSE 100 (H) 05/02/2006 1201   BUN 16 09/11/2016 0250   CREATININE 0.88 09/11/2016 0250   CALCIUM 8.5 (L) 09/11/2016 0250   PROT 5.1 (L) 09/08/2016 0430   ALBUMIN 1.9 (L) 09/08/2016 0430   AST 45 (H) 09/08/2016 0430   ALT 73 (H) 09/08/2016 0430   ALKPHOS 189 (H) 09/08/2016 0430   BILITOT 0.3 09/08/2016 0430   GFRNONAA >60 09/11/2016 0250   GFRAA >60 09/11/2016 0250   Lab Results  Component Value Date   CHOL 172 10/15/2013   HDL 57.40 10/15/2013   LDLCALC 105 (H) 10/15/2013   LDLDIRECT 148.3 09/03/2011   TRIG 46.0 10/15/2013   CHOLHDL 3 10/15/2013   Lab Results  Component Value Date   HGBA1C 6.3 (H)  08/30/2006   Lab Results  Component Value Date   MBWGYKZL93 570 09/03/2016   Lab Results  Component Value Date   TSH 1.704 09/03/2016       ASSESSMENT AND PLAN 66 y.o. year old male  has a past medical history of Allergy, Anxiety, Arthritis, Hepatic steatosis (2016), Hyperlipidemia, Intermittent depression, Metastatic cancer to bone [facility]), Neoplasm of face, Pneumonia, Prostate cancer ([place]), Recurrent HSV (herpes simplex virus), Sleep apnea,  and Ulcerative colitis. here with   No diagnosis found.     Mr Yurko is doing very well today.  Compliance report reveals excellent compliance.  He was encouraged to continue using CPAP nightly and for greater than 4 hours each night.  Sleep hygiene reviewed.  Healthy lifestyle habits encouraged.  He will continue close follow-up with oncology and primary care.  He will follow-up with me in 1 year, sooner if needed.  He verbalizes understanding and agreement with this plan.  Set up date 02/2017  No orders of the defined types were placed in this encounter.     No orders of the defined types were placed in this encounter.       Debbora Presto, FNP-C 04/16/2022, 9:10 AM [facility] [address] [place], [place] [address] [phone]

## 2022-04-16 NOTE — Patient Instructions (Incomplete)

## 2022-04-18 ENCOUNTER — Ambulatory Visit (INDEPENDENT_AMBULATORY_CARE_PROVIDER_SITE_OTHER): Payer: BC Managed Care – PPO | Admitting: Family Medicine

## 2022-04-18 ENCOUNTER — Encounter: Payer: Self-pay | Admitting: Family Medicine

## 2022-04-18 VITALS — BP 138/77 | HR 58 | Ht 66.0 in | Wt 184.0 lb

## 2022-04-18 DIAGNOSIS — G4733 Obstructive sleep apnea (adult) (pediatric): Secondary | ICD-10-CM | POA: Diagnosis not present

## 2022-04-19 ENCOUNTER — Telehealth: Payer: Self-pay

## 2022-04-26 DIAGNOSIS — Z79818 Long term (current) use of other agents affecting estrogen receptors and estrogen levels: Secondary | ICD-10-CM | POA: Diagnosis not present

## 2022-04-26 DIAGNOSIS — M542 Cervicalgia: Secondary | ICD-10-CM | POA: Diagnosis not present

## 2022-04-26 DIAGNOSIS — C61 Malignant neoplasm of prostate: Secondary | ICD-10-CM | POA: Diagnosis not present

## 2022-04-26 DIAGNOSIS — Z79899 Other long term (current) drug therapy: Secondary | ICD-10-CM | POA: Diagnosis not present

## 2022-04-26 DIAGNOSIS — R232 Flushing: Secondary | ICD-10-CM | POA: Diagnosis not present

## 2022-04-26 DIAGNOSIS — M79631 Pain in right forearm: Secondary | ICD-10-CM | POA: Diagnosis not present

## 2022-04-26 DIAGNOSIS — Z23 Encounter for immunization: Secondary | ICD-10-CM | POA: Diagnosis not present

## 2022-04-26 DIAGNOSIS — C7951 Secondary malignant neoplasm of bone: Secondary | ICD-10-CM | POA: Diagnosis not present

## 2022-05-01 DIAGNOSIS — M25551 Pain in right hip: Secondary | ICD-10-CM | POA: Diagnosis not present

## 2022-05-03 DIAGNOSIS — M25521 Pain in right elbow: Secondary | ICD-10-CM | POA: Diagnosis not present

## 2022-05-08 ENCOUNTER — Other Ambulatory Visit: Payer: Self-pay | Admitting: Orthopedic Surgery

## 2022-05-08 ENCOUNTER — Other Ambulatory Visit: Payer: Self-pay

## 2022-05-08 ENCOUNTER — Encounter (HOSPITAL_COMMUNITY): Payer: Self-pay | Admitting: Orthopedic Surgery

## 2022-05-08 NOTE — Progress Notes (Addendum)
Patient was called with questions and instructions for his surgery tomorrow.  Patient verbally denies any shortness of breath, fever, cough and chest pain during phone call. Patient denied any recent exposure to COVID  Anesthesia review: yes  -------------  SDW INSTRUCTIONS given:  Your procedure is scheduled on Wednesday, November 1st, 2023.  Report to [facility] Main Entrance "A" at 10:30 A.M., and check in at the Admitting office.  Call this number if you have problems the morning of surgery:  [phone]   Remember:  Do not eat after midnight the night before your surgery  You may drink clear liquids until 10:00 the morning of your surgery.   Clear liquids allowed are: Water, Non-Citrus Juices (without pulp), Carbonated Beverages, Clear Tea, Black Coffee Only, and Gatorade    Take these medicines the morning of surgery with A SIP OF WATER: Colazal, Prednison; PRN: Valtrex  As of today, STOP taking any Aspirin (unless otherwise instructed by your surgeon) Aleve, Naproxen, Ibuprofen, Motrin, Advil, Goody's, BC's, all herbal medications, fish oil, and all vitamins.    The day of surgery:                    Do not wear jewelry,             Do not wear lotions, powders, colognes, or deodorant.            Men may shave face and neck.            Do not bring valuables to the hospital.            [facility] is not responsible for any belongings or valuables.  Do NOT Smoke (Tobacco/Vaping) 24 hours prior to your procedure If you use a CPAP at night, you may bring all equipment for your overnight stay.   Contacts, glasses, dentures or bridgework may not be worn into surgery.      For patients admitted to the hospital, discharge time will be determined by your treatment team.   Patients discharged the day of surgery will not be allowed to drive home, and someone needs to stay with them for 24 hours.    Special instructions:   [place]- Preparing For Surgery  Before  surgery, you can play an important role. Because skin is not sterile, your skin needs to be as free of germs as possible. You can reduce the number of germs on your skin by washing with CHG (chlorahexidine gluconate) Soap before surgery.  CHG is an antiseptic cleaner which kills germs and bonds with the skin to continue killing germs even after washing.    Oral Hygiene is also important to reduce your risk of infection.  Remember - BRUSH YOUR TEETH THE MORNING OF SURGERY WITH YOUR REGULAR TOOTHPASTE  Please do not use if you have an allergy to CHG or antibacterial soaps. If your skin becomes reddened/irritated stop using the CHG.  Do not shave (including legs and underarms) for at least 48 hours prior to first CHG shower. It is OK to shave your face.  Please follow these instructions carefully.   Shower the NIGHT BEFORE SURGERY and the MORNING OF SURGERY with DIAL Soap.   Pat yourself dry with a CLEAN TOWEL.  Wear CLEAN PAJAMAS to bed the night before surgery  Place CLEAN SHEETS on your bed the night of your first shower and DO NOT SLEEP WITH PETS.   Day of Surgery: Please shower morning of surgery  Wear Clean/Comfortable clothing  the morning of surgery Do not apply any deodorants/lotions.   Remember to brush your teeth WITH YOUR REGULAR TOOTHPASTE.   Questions were answered. Patient verbalized understanding of instructions.

## 2022-05-09 ENCOUNTER — Ambulatory Visit (HOSPITAL_COMMUNITY): Payer: BC Managed Care – PPO | Admitting: Anesthesiology

## 2022-05-09 ENCOUNTER — Ambulatory Visit (HOSPITAL_COMMUNITY)
Admission: RE | Admit: 2022-05-09 | Discharge: 2022-05-09 | Disposition: A | Discharge status: Home / Self Care | Payer: BC Managed Care – PPO | Attending: Orthopedic Surgery | Admitting: Orthopedic Surgery

## 2022-05-09 ENCOUNTER — Encounter (HOSPITAL_COMMUNITY): Payer: Self-pay | Admitting: Orthopedic Surgery

## 2022-05-09 ENCOUNTER — Encounter (HOSPITAL_COMMUNITY)
Admission: RE | Disposition: A | Discharge status: Home / Self Care | Payer: Self-pay | Source: Home / Self Care | Attending: Orthopedic Surgery

## 2022-05-09 ENCOUNTER — Other Ambulatory Visit: Payer: Self-pay

## 2022-05-09 ENCOUNTER — Ambulatory Visit (HOSPITAL_COMMUNITY): Payer: BC Managed Care – PPO

## 2022-05-09 DIAGNOSIS — X58XXXA Exposure to other specified factors, initial encounter: Secondary | ICD-10-CM | POA: Insufficient documentation

## 2022-05-09 DIAGNOSIS — G4730 Sleep apnea, unspecified: Secondary | ICD-10-CM | POA: Diagnosis not present

## 2022-05-09 DIAGNOSIS — Z8546 Personal history of malignant neoplasm of prostate: Secondary | ICD-10-CM | POA: Insufficient documentation

## 2022-05-09 DIAGNOSIS — Z8711 Personal history of peptic ulcer disease: Secondary | ICD-10-CM | POA: Diagnosis not present

## 2022-05-09 DIAGNOSIS — I739 Peripheral vascular disease, unspecified: Secondary | ICD-10-CM | POA: Insufficient documentation

## 2022-05-09 DIAGNOSIS — Z9989 Dependence on other enabling machines and devices: Secondary | ICD-10-CM | POA: Diagnosis not present

## 2022-05-09 DIAGNOSIS — S46211A Strain of muscle, fascia and tendon of other parts of biceps, right arm, initial encounter: Secondary | ICD-10-CM | POA: Diagnosis not present

## 2022-05-09 DIAGNOSIS — F418 Other specified anxiety disorders: Secondary | ICD-10-CM | POA: Diagnosis not present

## 2022-05-09 DIAGNOSIS — Z87891 Personal history of nicotine dependence: Secondary | ICD-10-CM | POA: Insufficient documentation

## 2022-05-09 DIAGNOSIS — G4733 Obstructive sleep apnea (adult) (pediatric): Secondary | ICD-10-CM | POA: Diagnosis not present

## 2022-05-09 HISTORY — PX: DISTAL BICEPS TENDON REPAIR: SHX1461

## 2022-05-09 SURGERY — REPAIR, TENDON, BICEPS, DISTAL
Anesthesia: Regional | Site: Arm Upper | Laterality: Right

## 2022-05-09 MED ORDER — PROPOFOL 500 MG/50ML IV EMUL
INTRAVENOUS | Status: DC | PRN
  Administered 2022-05-09: 50 ug/kg/min via INTRAVENOUS

## 2022-05-09 MED ORDER — PROPOFOL 10 MG/ML IV BOLUS
INTRAVENOUS | Status: AC
  Filled 2022-05-09: qty 20

## 2022-05-09 MED ORDER — LIDOCAINE HCL (PF) 1 % IJ SOLN
INTRAMUSCULAR | Status: AC
  Filled 2022-05-09: qty 30

## 2022-05-09 MED ORDER — BUPIVACAINE HCL (PF) 0.25 % IJ SOLN
INTRAMUSCULAR | Status: AC
  Filled 2022-05-09: qty 30

## 2022-05-09 MED ORDER — CLONIDINE HCL (ANALGESIA) 100 MCG/ML EP SOLN
EPIDURAL | Status: DC | PRN
  Administered 2022-05-09: 100 ug

## 2022-05-09 MED ORDER — SUCCINYLCHOLINE CHLORIDE 200 MG/10ML IV SOSY
PREFILLED_SYRINGE | INTRAVENOUS | Status: AC
  Filled 2022-05-09: qty 10

## 2022-05-09 MED ORDER — LIDOCAINE 2% (20 MG/ML) 5 ML SYRINGE
INTRAMUSCULAR | Status: DC | PRN
  Administered 2022-05-09: 100 mg via INTRAVENOUS

## 2022-05-09 MED ORDER — CHLORHEXIDINE GLUCONATE 0.12 % MT SOLN
15.0000 mL | Freq: Once | OROMUCOSAL | Status: AC
  Administered 2022-05-09: 15 mL via OROMUCOSAL
  Filled 2022-05-09: qty 15

## 2022-05-09 MED ORDER — LIDOCAINE 2% (20 MG/ML) 5 ML SYRINGE
INTRAMUSCULAR | Status: AC
  Filled 2022-05-09: qty 5

## 2022-05-09 MED ORDER — BUPIVACAINE-EPINEPHRINE (PF) 0.5% -1:200000 IJ SOLN
INTRAMUSCULAR | Status: DC | PRN
  Administered 2022-05-09: 30 mL via PERINEURAL

## 2022-05-09 MED ORDER — ONDANSETRON HCL 4 MG/2ML IJ SOLN: 4.0000 mg | Freq: Once | INTRAMUSCULAR | Status: DC | PRN

## 2022-05-09 MED ORDER — 0.9 % SODIUM CHLORIDE (POUR BTL) OPTIME
TOPICAL | Status: DC | PRN
  Administered 2022-05-09: 1000 mL

## 2022-05-09 MED ORDER — LACTATED RINGERS IV SOLN: INTRAVENOUS | Status: DC

## 2022-05-09 MED ORDER — EPHEDRINE 5 MG/ML INJ
INTRAVENOUS | Status: AC
  Filled 2022-05-09: qty 5

## 2022-05-09 MED ORDER — ACETAMINOPHEN 325 MG PO TABS: 650.0000 mg | ORAL_TABLET | Freq: Four times a day (QID) | ORAL | Status: AC

## 2022-05-09 MED ORDER — GABAPENTIN 300 MG PO CAPS
300.0000 mg | ORAL_CAPSULE | Freq: Once | ORAL | Status: AC
  Administered 2022-05-09: 300 mg via ORAL
  Filled 2022-05-09: qty 1

## 2022-05-09 MED ORDER — ACETAMINOPHEN 500 MG PO TABS
1000.0000 mg | ORAL_TABLET | Freq: Once | ORAL | Status: AC
  Administered 2022-05-09: 1000 mg via ORAL
  Filled 2022-05-09: qty 2

## 2022-05-09 MED ORDER — CEFAZOLIN SODIUM-DEXTROSE 2-3 GM-%(50ML) IV SOLR
INTRAVENOUS | Status: DC | PRN
  Administered 2022-05-09: 2 g via INTRAVENOUS

## 2022-05-09 MED ORDER — FENTANYL CITRATE (PF) 100 MCG/2ML IJ SOLN
INTRAMUSCULAR | Status: AC
  Administered 2022-05-09: 50 ug via INTRAVENOUS
  Filled 2022-05-09: qty 2

## 2022-05-09 MED ORDER — MIDAZOLAM HCL 2 MG/2ML IJ SOLN
INTRAMUSCULAR | Status: AC
  Administered 2022-05-09: 2 mg via INTRAVENOUS
  Filled 2022-05-09: qty 2

## 2022-05-09 MED ORDER — FENTANYL CITRATE (PF) 100 MCG/2ML IJ SOLN: 25.0000 ug | INTRAMUSCULAR | Status: DC | PRN

## 2022-05-09 MED ORDER — ORAL CARE MOUTH RINSE: 15.0000 mL | Freq: Once | OROMUCOSAL | Status: AC

## 2022-05-09 MED ORDER — PROPOFOL 10 MG/ML IV BOLUS
INTRAVENOUS | Status: DC | PRN
  Administered 2022-05-09: 30 mg via INTRAVENOUS

## 2022-05-09 MED ORDER — MIDAZOLAM HCL 2 MG/2ML IJ SOLN: 2.0000 mg | Freq: Once | INTRAMUSCULAR | Status: AC

## 2022-05-09 MED ORDER — CEFAZOLIN SODIUM-DEXTROSE 2-4 GM/100ML-% IV SOLN
INTRAVENOUS | Status: AC
  Filled 2022-05-09: qty 100

## 2022-05-09 MED ORDER — OXYCODONE HCL 5 MG PO TABS: 5.0000 mg | ORAL_TABLET | Freq: Four times a day (QID) | ORAL | 0 refills | Status: DC | PRN

## 2022-05-09 MED ORDER — FENTANYL CITRATE (PF) 100 MCG/2ML IJ SOLN: 50.0000 ug | Freq: Once | INTRAMUSCULAR | Status: AC

## 2022-05-09 MED ORDER — EPHEDRINE SULFATE-NACL 50-0.9 MG/10ML-% IV SOSY
PREFILLED_SYRINGE | INTRAVENOUS | Status: DC | PRN
  Administered 2022-05-09: 5 mg via INTRAVENOUS

## 2022-05-09 SURGICAL SUPPLY — 46 items
BAG COUNTER SPONGE SURGICOUNT (BAG) ×1 IMPLANT
BENZOIN TINCTURE PRP APPL 2/3 (GAUZE/BANDAGES/DRESSINGS) IMPLANT
BLADE SURG 15 STRL LF DISP TIS (BLADE) ×1 IMPLANT
BLADE SURG 15 STRL SS (BLADE) ×1
BNDG COHESIVE 2X5 TAN STRL LF (GAUZE/BANDAGES/DRESSINGS) IMPLANT
BNDG COHESIVE 4X5 TAN STRL (GAUZE/BANDAGES/DRESSINGS) ×1 IMPLANT
BNDG COHESIVE 4X5 TAN STRL LF (GAUZE/BANDAGES/DRESSINGS) IMPLANT
BNDG ESMARK 4X9 LF (GAUZE/BANDAGES/DRESSINGS) ×1 IMPLANT
BNDG GAUZE DERMACEA FLUFF 4 (GAUZE/BANDAGES/DRESSINGS) ×2 IMPLANT
BRUSH SCRUB EZ PLAIN DRY (MISCELLANEOUS) IMPLANT
CANISTER SUCT 3000ML PPV (MISCELLANEOUS) ×1 IMPLANT
CHLORAPREP W/TINT 26 (MISCELLANEOUS) ×1 IMPLANT
CORD BIPOLAR FORCEPS 12FT (ELECTRODE) ×1 IMPLANT
COVER SURGICAL LIGHT HANDLE (MISCELLANEOUS) IMPLANT
CUFF TOURN SGL LL 8 NO SLV (TOURNIQUET CUFF) IMPLANT
CUFF TOURN SGL QUICK 18X4 (TOURNIQUET CUFF) IMPLANT
CUFF TOURN SGL QUICK 24 (TOURNIQUET CUFF)
CUFF TRNQT CYL 24X4X16.5-23 (TOURNIQUET CUFF) IMPLANT
DRAPE SURG 17X23 STRL (DRAPES) ×1 IMPLANT
DRSG EMULSION OIL 3X3 NADH (GAUZE/BANDAGES/DRESSINGS) IMPLANT
GAUZE SPONGE 4X4 12PLY STRL (GAUZE/BANDAGES/DRESSINGS) ×1 IMPLANT
GLOVE BIO SURGEON STRL SZ7.5 (GLOVE) ×1 IMPLANT
GLOVE BIOGEL PI IND STRL 8 (GLOVE) ×1 IMPLANT
GOWN STRL REUS W/ TWL XL LVL3 (GOWN DISPOSABLE) ×1 IMPLANT
GOWN STRL REUS W/TWL XL LVL3 (GOWN DISPOSABLE) ×1
KIT BASIN OR (CUSTOM PROCEDURE TRAY) ×1 IMPLANT
NDL HYPO 25X1 1.5 SAFETY (NEEDLE) IMPLANT
NDL TAPERED W/ NITINOL LOOP (MISCELLANEOUS) IMPLANT
NEEDLE HYPO 25X1 1.5 SAFETY (NEEDLE) IMPLANT
NEEDLE TAPERED W/ NITINOL LOOP (MISCELLANEOUS) ×1 IMPLANT
NS IRRIG 1000ML POUR BTL (IV SOLUTION) ×1 IMPLANT
NS IRRIG 500ML POUR BTL (IV SOLUTION) IMPLANT
PACK ORTHO EXTREMITY (CUSTOM PROCEDURE TRAY) ×1 IMPLANT
PAD CAST 4YDX4 CTTN HI CHSV (CAST SUPPLIES) ×1 IMPLANT
PADDING CAST ABS COTTON 4X4 ST (CAST SUPPLIES) IMPLANT
PADDING CAST COTTON 4X4 STRL (CAST SUPPLIES) ×1
SLING ARM FOAM STRAP MED (SOFTGOODS) IMPLANT
STRIP CLOSURE SKIN 1/2X4 (GAUZE/BANDAGES/DRESSINGS) IMPLANT
SUT VIC AB 2-0 CT3 27 (SUTURE) IMPLANT
SUT VICRYL 4-0 PS2 18IN ABS (SUTURE) IMPLANT
SUT VICRYL RAPIDE 4/0 PS 2 (SUTURE) ×1 IMPLANT
SYR 10ML LL (SYRINGE) IMPLANT
SYSTEM ARTHRO FOR DISTAL BICEP (Orthopedic Implant) IMPLANT
TOWEL GREEN STERILE FF (TOWEL DISPOSABLE) ×1 IMPLANT
TUBE CONNECTING 12X1/4 (SUCTIONS) IMPLANT
UNDERPAD 30X36 HEAVY ABSORB (UNDERPADS AND DIAPERS) ×1 IMPLANT

## 2022-05-09 NOTE — Anesthesia Procedure Notes (Signed)
Anesthesia Regional Block: Supraclavicular block   Pre-Anesthetic Checklist: , timeout performed,  Correct Patient, Correct Site, Correct Laterality,  Correct Procedure, Correct Position, site marked,  Risks and benefits discussed,  Surgical consent,  Pre-op evaluation,  At surgeon's request and post-op pain management  Laterality: Right  Prep: chloraprep       Needles:  Injection technique: Single-shot  Needle Type: Echogenic Needle     Needle Length: 9cm  Needle Gauge: 21     Additional Needles:   Procedures:,,,, ultrasound used (permanent image in chart),,    Narrative:  Start time: 05/09/2022 12:20 PM End time: 05/09/2022 12:26 PM Injection made incrementally with aspirations every 5 mL.  Performed by: Personally  Anesthesiologist: Santa Lighter, MD  Additional Notes: No pain on injection. No increased resistance to injection. Injection made in 5cc increments.  Good needle visualization.  Patient tolerated procedure well.

## 2022-05-09 NOTE — Op Note (Signed)
05/09/2022  1:31 PM  PATIENT:  [name]  66 y.o. male  PRE-OPERATIVE DIAGNOSIS: Right distal biceps tendon rupture  POST-OPERATIVE DIAGNOSIS:  Same  PROCEDURE: Right distal biceps tendon repair, [phone]  SURGEON: Rayvon Char. Grandville Silos, MD  PHYSICIAN ASSISTANT: Morley Kos, OPA-C  ANESTHESIA: Regional block/MAC  SPECIMENS:  None  DRAINS:   None  EBL: Less than 10 mL  PREOPERATIVE INDICATIONS:  [name] is a  66 y.o. male with right distal biceps tendon rupture, resulting in pain and weakness, especially with supination  The risks benefits and alternatives were discussed with the patient preoperatively including but not limited to the risks of infection, bleeding, nerve injury, cardiopulmonary complications, the need for revision surgery, among others, and the patient verbalized understanding and consented to proceed.  OPERATIVE IMPLANTS: Arthrex bio button and 32m PEEK interference screw  OPERATIVE PROCEDURE:  After receiving prophylactic antibiotics and a regional block, the patient was escorted to the operative theatre and placed in a supine position.  A surgical "time-out" was performed during which the planned procedure, proposed operative site, and the correct patient identity were compared to the operative consent and agreement confirmed by the circulating nurse according to current facility policy.  Following application of a tourniquet to the operative extremity, the exposed skin was prepped with Chloraprep and draped in the usual sterile fashion.  The limb was exsanguinated with an Esmarch bandage and the tourniquet inflated to approximately 1018mg higher than systolic BP.  A transverse incision was made about 2 fingerbreadths anterior to the antecubital fossa on the volar surface of the forearm.  Full-thickness flaps were elevated with care to protect and preserve the large subcutaneous veins.  The biceps tendon was found encased in organizing fibrinous scar.  It  actually had not retracted very far at all.  It was cleared of the scar in the distal and resected of a few millimeters back to better tendon.  The whipstitch of #5 FiberWire was then placed in standard fashion.  The biceps tuberosity was exposed and cleaned of soft tissue debris with a rondure.  The tunnel was then made for passage, with a smaller hole on the far cortex, and an 8 mm hole made proximally after the tendon measured to be about 7-1/2 mm.  The bile button was then passed and flipped and confirmed fluoroscopically.  The tendon was then advanced into the intramedullary portion of the radius with a sliding technique and secured with a stitch placed with a free needle through the distal end of tendon.  Once secured in this fashion, couple knots were made and then the interference screw was placed on the radial side to help push the tendon more to the ulnar side of the hole.  The suture was then tied back over the interference screw to help lock it in place as well.  It was cut.  There was good tension on the biceps and full pronation and supination could be obtained passively.  There was a little tightness in the biceps that did not allow for full extension without a little bit of appreciable tension.  The tourniquet was released, some additional hemostasis obtained with bipolar electrocautery and then the skin was reapproximated with 4-0 Vicryl Rapide deep dermal buried subcuticular sutures as well as running subcuticular suture with benzoin and Steri-Strips externally.  A bulky dressing was applied and he was taken to the recovery room in stable condition.  DISPOSITION: He will be discharged home today with typical instructions, allowing only  light ADLs with the arm, remaining in the sling otherwise, and having him back in 10 to 15 days for reevaluation

## 2022-05-09 NOTE — Discharge Instructions (Signed)
Discharge Instructions   You have a bulky dressing on your arm. Move your fingers as much as possible, making a full fist and fully opening the fist. Elevate your hand to reduce pain & swelling of the digits.  Ice over the operative site may be helpful to reduce pain & swelling.  DO NOT USE HEAT. Pain medicine has been prescribed for you.  Return to taking Celebrex daily. Do NOT take Ibuprofen and Celebrex together. Take 650 mg of Tylenol every 6 hours. Add the Oxycodone 5 mg to the Tylenol if needed for severe post operative pain. Leave the dressing in place until you return to our office.  You may shower, but keep the bandage clean & dry.  You may drive a car when you are off of prescription pain medications and can safely control your vehicle with both hands. Call our office today to schedule a follow up appointment for 10-15 days from the date of surgery.   Please call [phone] during normal business hours or [phone] after hours for any problems. Including the following:  - excessive redness of the incisions - drainage for more than 4 days - fever of more than 101.5 F  *Please note that pain medications will not be refilled after hours or on weekends.

## 2022-05-09 NOTE — H&P (Signed)
ORTHOPAEDIC HISTORY & PHYSICAL REQUESTING PHYSICIAN: Milly Jakob, MD  Chief Complaint: right arm pain and weakness  HPI: [name] is a 66 y.o. male who presented with right arm pain and weakness that originated after an episode in which he had forceful biceps contraction and felt a pop.  After couple Staniszewski he presented for evaluation and clinical concerns were for a biceps tendon rupture.  This was confirmed via MRI scan and he presents today for exploration and repair  Past Medical History:  Diagnosis Date   Allergy    Anxiety    Arthritis    Hepatic steatosis 2016   Hyperlipidemia    Intermittent depression    Metastatic cancer to bone ([place])    left rib.   Neoplasm of face    benign   Pneumonia    left lung with a loculated effusion    Prostate cancer (HCC)    Recurrent HSV (herpes simplex virus)    Sleep apnea    Ulcerative colitis    Past Surgical History:  Procedure Laterality Date   DECORTICATION Left 09/06/2016   Procedure: DECORTICATION;  Surgeon: [name], MD;  Location: [facility];  Service: Thoracic;  Laterality: Left;   INGUINAL HERNIA REPAIR     KNEE ARTHROPLASTY Right    PROSTATECTOMY  04/2015   SHOULDER ARTHROTOMY Right    VIDEO ASSISTED THORACOSCOPY (VATS)/EMPYEMA Left 09/06/2016   Procedure: VIDEO ASSISTED THORACOSCOPY (VATS)/DRAIN EMPYEMA;  Surgeon: [name], MD;  Location: [facility];  Service: Thoracic;  Laterality: Left;   VIDEO BRONCHOSCOPY N/A 09/06/2016   Procedure: VIDEO BRONCHOSCOPY;  Surgeon: [name], MD;  Location[facility];  Service: Thoracic;  Laterality: N/A;   Social History   Socioeconomic History   Marital status: Legally Separated    Spouse name: Not on file   Number of children: 3   Years of education: Not on file   Highest education level: Not on file  Occupational History   Occupation: Charity fundraiser  Tobacco Use   Smoking status: Former   Smokeless tobacco: Never   Tobacco comments:     quit 1992  Vaping Use   Vaping Use: Never used  Substance and Sexual Activity   Alcohol use: Yes    Alcohol/week: 0.0 standard drinks of alcohol   Drug use: No   Sexual activity: Not on file  Other Topics Concern   Not on file  Social History Narrative   [facility] (09/04/16):   Currently he owns a flooring company. Questionable asbestos exposure. No mold exposure. Originally from [place]. Does have a dog but no bird exposure.   Social Determinants of Health   Financial Resource Strain: Not on file  Food Insecurity: Not on file  Transportation Needs: Not on file  Physical Activity: Not on file  Stress: Not on file  Social Connections: Not on file   Family History  Problem Relation Age of Onset   Heart attack Father    Hypertension Father    Hyperlipidemia Other    Diabetes Other    Thyroid disease Other    Lung cancer Other    Breast cancer Other    Colon cancer Other    Juvenile idiopathic arthritis Daughter    Raynaud syndrome Daughter    Breast cancer Mother    Allergies  Allergen Reactions   Sulfa Antibiotics Hives and Rash   Prior to Admission medications   Medication Sig Start Date End Date Taking? Authorizing Provider  ascorbic acid (  VITAMIN C) 500 MG tablet Take 500 mg by mouth daily.   Yes [provider]  balsalazide (COLAZAL) 750 MG capsule TAKE 1 CAPSULE(750 MG) BY MOUTH TWICE DAILY 08/05/18  Yes Armbruster, Carlota Raspberry, MD  Calcium-Phosphorus-Vitamin D (CITRACAL +D3 PO) Take 2 tablets by mouth daily.   Yes [provider]  celecoxib (CELEBREX) 200 MG capsule Take 200 mg by mouth 2 (two) times daily.   Yes [provider]  Cholecalciferol (VITAMIN D3) 250 MCG (10000 UT) capsule Take 10,000 Units by mouth daily.   Yes [provider]  diazepam (VALIUM) 5 MG tablet Take 2.5 mg by mouth at bedtime. 03/25/20  Yes [provider]  ezetimibe (ZETIA) 10 MG tablet Take 10 mg by mouth at bedtime.   Yes [provider]  Glucosamine-Chondroitin 250-200 MG CAPS Take 2 tablets by mouth daily.   Yes [provider]  Leuprolide Acetate, 3 Month, (LUPRON DEPOT, 23-MONTH, IM) Inject into the muscle every 3 (three) months.   Yes [provider]  Lycopene 10 MG CAPS Take 10 mg by mouth daily.   Yes [provider]  Multiple Vitamin (MULTIVITAMIN) tablet Take 1 tablet by mouth daily.   Yes [provider]  nystatin cream (MYCOSTATIN) Apply 1 Application topically daily. Apply to corners of mouths   Yes [provider]  OMEGA-3 FATTY ACIDS PO Take 500 mg by mouth daily.  250 mg per cap   Yes [provider]  predniSONE (DELTASONE) 5 MG tablet Take 5 mg by mouth daily. 06/25/17  Yes [provider]  rosuvastatin (CRESTOR) 40 MG tablet Take 40 mg by mouth at bedtime.   Yes [provider]  solifenacin (VESICARE) 5 MG tablet Take 5 mg by mouth every evening.   Yes [provider]  Turmeric 500 MG CAPS Take 500 mg by mouth daily.   Yes [provider]  zinc gluconate 50 MG tablet Take 50 mg by mouth daily.   Yes [provider]  ZYTIGA 250 MG tablet Take 1,000 mg by mouth daily. About Noon 06/25/17  Yes [provider]  valACYclovir (VALTREX) 500 MG tablet Take 500 mg by mouth 2 (two) times daily as needed (breakouts).    [provider]   DG MINI C-ARM IMAGE ONLY  Result Date: 05/09/2022 There is no interpretation for this exam.  This order is for images obtained during a surgical procedure.  Please See "Surgeries" Tab for more information regarding the procedure.    Positive ROS: All other systems have been reviewed and were otherwise negative with the exception of those mentioned in the HPI and as above.  Physical Exam: Vitals: Refer to EMR. Constitutional:  WD, WN, NAD HEENT:  NCAT, EOMI Neuro/Psych:  Alert & oriented to person, place, and time; appropriate mood & affect Lymphatic: No  generalized extremity edema or lymphadenopathy Extremities / MSK:  The extremities are normal with respect to appearance, ranges of motion, joint stability, muscle strength/tone, sensation, & perfusion except as otherwise noted:  Right elbow tender anteriorly over the distal biceps, with tension of the biceps altered compared to the contralateral side.  Extreme supination weakness accompanied by pain  Assessment: Right distal biceps tendon rupture and retraction  Plan: To the OR for operative repair.  Goals, risk, options reviewed and consent obtained  Shanon Brow A. [place] Silos, [address] [place], [place]  [address] Office: [phone] Mobile:  [phone]  05/09/2022, 1:29 PM

## 2022-05-09 NOTE — Interval H&P Note (Signed)
History and Physical Interval Note:  05/09/2022 1:31 PM  [name]  has presented today for surgery, with the diagnosis of [place], RIGHT ARM.  The various methods of treatment have been discussed with the patient and family. After consideration of risks, benefits and other options for treatment, the patient has consented to  Procedure(s) with comments: RIGHT DISTAL BICEPS TENDON REPAIR AT ELBOW (Right) - MAC WITH PRE OP BLOCK as a surgical intervention.  The patient's history has been reviewed, patient examined, no change in status, stable for surgery.  I have reviewed the patient's chart and labs.  Questions were answered to the patient's satisfaction.     Jolyn Nap

## 2022-05-09 NOTE — Anesthesia Postprocedure Evaluation (Signed)
Anesthesia Post Note  Patient: [name]  Procedure(s) Performed: RIGHT DISTAL BICEPS TENDON REPAIR AT ELBOW (Right: Arm Upper)     Patient location during evaluation: PACU Anesthesia Type: Regional Level of consciousness: awake and alert Pain management: pain level controlled Vital Signs Assessment: post-procedure vital signs reviewed and stable Respiratory status: spontaneous breathing, nonlabored ventilation and respiratory function stable Cardiovascular status: stable and blood pressure returned to baseline Postop Assessment: no apparent nausea or vomiting Anesthetic complications: no   No notable events documented.  Last Vitals:  Vitals:   05/09/22 1455 05/09/22 1510  BP: 98/66 103/66  Pulse: 66 61  Resp: 12 12  Temp: (!) 36.3 C   SpO2: 97% 93%    Last Pain:  Vitals:   05/09/22 1455  TempSrc:   PainSc: 0-No pain                 Santa Lighter

## 2022-05-09 NOTE — Anesthesia Preprocedure Evaluation (Addendum)
Anesthesia Evaluation  Patient identified by MRN, date of birth, ID band Patient awake    Reviewed: Allergy & Precautions, NPO status , Patient's Chart, lab work & pertinent test results  Airway Mallampati: II  TM Distance: >3 FB Neck ROM: Full    Dental  (+) Dental Advisory Given, Missing, Chipped, Caps,    Pulmonary sleep apnea and Continuous Positive Airway Pressure Ventilation , former smoker   Pulmonary exam normal breath sounds clear to auscultation       Cardiovascular (-) angina + Peripheral Vascular Disease (ascending aneurysm)  Normal cardiovascular exam+ Valvular Problems/Murmurs (bicuspid aortic valve with moderate aortic stenosis) AS  Rhythm:Regular Rate:Normal  Echocardiogram 03/23/2022 IMPRESSIONS    1. Left ventricular ejection fraction, by estimation, is 60 to 65%. The left ventricle has normal function. The left ventricle has no regional wall motion abnormalities. There is mild concentric left ventricular hypertrophy. Left ventricular diastolic parameters are consistent with Grade I diastolic dysfunction (impaired relaxation).  2. Right ventricular systolic function is normal. The right ventricular size is normal.  3. The mitral valve is normal in structure. Mild mitral valve  regurgitation.  4. The aortic valve is bicuspid. There is moderate calcification of the aortic valve. There is moderate thickening of the aortic valve. Aortic valve regurgitation is not visualized. Mild to moderate aortic valve stenosis.  5. Aortic dilatation noted. There is mild dilatation of the aortic root, measuring 44 mm. There is mild dilatation of the ascending aorta, measuring 43 mm.    Neuro/Psych  PSYCHIATRIC DISORDERS Anxiety Depression    negative neurological ROS     GI/Hepatic Neg liver ROS, PUD,,,UC   Endo/Other  negative endocrine ROS    Renal/GU negative Renal ROS   Prostate cancer     Musculoskeletal  (+)  Arthritis ,  STRAIN OF MUSCLE,FASCIA, AND TENDON OF OTHER PART OF BICEPS, RIGHT ARM   Abdominal   Peds  Hematology negative hematology ROS (+)   Anesthesia Other Findings Day of surgery medications reviewed with the patient.  Reproductive/Obstetrics                             Anesthesia Physical Anesthesia Plan  ASA: 3  Anesthesia Plan: Regional   Post-op Pain Management: Regional block* and Tylenol PO (pre-op)*   Induction: Intravenous  PONV Risk Score and Plan: 1 and TIVA, Treatment may vary due to age or medical condition, Dexamethasone and Ondansetron  Airway Management Planned: Natural Airway and Simple Face Mask  Additional Equipment:   Intra-op Plan:   Post-operative Plan:   Informed Consent: I have reviewed the patients History and Physical, chart, labs and discussed the procedure including the risks, benefits and alternatives for the proposed anesthesia with the patient or authorized representative who has indicated his/her understanding and acceptance.     Dental advisory given  Plan Discussed with: CRNA  Anesthesia Plan Comments:         Anesthesia Quick Evaluation

## 2022-05-09 NOTE — Transfer of Care (Signed)
Immediate Anesthesia Transfer of Care Note  Patient: [name]  Procedure(s) Performed: RIGHT DISTAL BICEPS TENDON REPAIR AT ELBOW (Right: Arm Upper)  Patient Location: PACU  Anesthesia Type:MAC and MAC combined with regional for post-op pain  Level of Consciousness: drowsy and patient cooperative  Airway & Oxygen Therapy: Patient Spontanous Breathing  Post-op Assessment: Report given to RN and Post -op Vital signs reviewed and stable  Post vital signs: Reviewed and stable  Last Vitals:  Vitals Value Taken Time  BP    Temp    Pulse    Resp    SpO2      Last Pain:  Vitals:   05/09/22 1059  TempSrc:   PainSc: 0-No pain         Complications: No notable events documented.

## 2022-05-10 ENCOUNTER — Encounter (HOSPITAL_COMMUNITY): Payer: Self-pay | Admitting: Orthopedic Surgery

## 2022-05-23 ENCOUNTER — Telehealth: Payer: Self-pay | Admitting: Neurology

## 2022-05-23 NOTE — Telephone Encounter (Signed)
HST- [employer] Josem Kaufmann: [id number] (exp. 04/30/22 to 06/28/22).  Patient is scheduled at [facility] For 06/19/22 at 10:30 AM.  Mailed packet to the patient.

## 2022-06-19 ENCOUNTER — Ambulatory Visit (INDEPENDENT_AMBULATORY_CARE_PROVIDER_SITE_OTHER): Payer: Self-pay | Admitting: Neurology

## 2022-06-19 DIAGNOSIS — G4733 Obstructive sleep apnea (adult) (pediatric): Secondary | ICD-10-CM

## 2022-06-21 NOTE — Progress Notes (Unsigned)
   [facility]  HOME SLEEP TEST (Watch PAT) REPORT  STUDY DATE: 06/19/22  DOB: 1955-08-24  MRN: [id number]  ORDERING CLINICIAN: Star Age, MD, PhD   REFERRING CLINICIAN: Debbora Presto, NP  CLINICAL INFORMATION/HISTORY:66 year old male with an underlying medical history of prostate cancer, history of pneumonia with pleural effusion and empyema, s/p surgical intervention on 09/06/16, history of ulcerative colitis, vitamin D deficiency, hyperlipidemia, hearing loss, and overweight state, who was previously diagnosed with obstructive sleep apnea several years ago and placed on CPAP therapy. He has been compliant with autoPAP therapy. He presents for re-evaluation.  BMI: 29.8 kg/m  FINDINGS:   Sleep Summary:   Total Recording Time (hours, min): 8 hours, 54 min  Total Sleep Time (hours, min):  7 hours, 51 min  Percent REM (%):    17.2%   Respiratory Indices:   Calculated pAHI (per hour):  5.8/hour         REM pAHI:    12.7/hour       NREM pAHI: 4.4/hour  Central pAHI: 0/hour  Oxygen Saturation Statistics:    Oxygen Saturation (%) Mean: 94%   Minimum oxygen saturation (%):                 78%   O2 Saturation Range (%): 78 - 100%    O2 Saturation (minutes) <=88%: 0.3 min  Pulse Rate Statistics:   Pulse Mean (bpm):    55/min    Pulse Range (44 - 75/min)   IMPRESSION: OSA (obstructive sleep apnea)   RECOMMENDATION:  ***   INTERPRETING PHYSICIAN:   Star Age, MD, PhD Medical Director, [facility]) Diplomat, ABPN (Neurology and Sleep)   [facility] [address] [place], [place] [address] [phone]

## 2022-06-25 NOTE — Procedures (Signed)
[name]  HOME SLEEP TEST (Watch PAT) REPORT  STUDY DATE: 06/19/22  DOB: April 06, 1956  MRN: [id number]  ORDERING CLINICIAN: Star Age, MD, PhD   REFERRING CLINICIAN: Debbora Presto, NP  CLINICAL INFORMATION/HISTORY:66 year old male with an underlying medical history of prostate cancer, history of pneumonia with pleural effusion and empyema, s/p surgical intervention on 09/06/16, history of ulcerative colitis, vitamin D deficiency, hyperlipidemia, hearing loss, and overweight state, who was previously diagnosed with obstructive sleep apnea several years ago and placed on CPAP therapy. He has been compliant with autoPAP therapy. He presents for re-evaluation.  BMI: 29.8 kg/m  FINDINGS:   Sleep Summary:   Total Recording Time (hours, min): 8 hours, 54 min  Total Sleep Time (hours, min):  7 hours, 51 min  Percent REM (%):    17.2%   Respiratory Indices:   Calculated pAHI (per hour):  5.8/hour         REM pAHI:    12.7/hour       NREM pAHI: 4.4/hour  Central pAHI: 0/hour  Oxygen Saturation Statistics:    Oxygen Saturation (%) Mean: 94%   Minimum oxygen saturation (%):                 78%   O2 Saturation Range (%): 78 - 100%    O2 Saturation (minutes) <=88%: 0.3 min  Pulse Rate Statistics:   Pulse Mean (bpm):    55/min    Pulse Range (44 - 75/min)   IMPRESSION: OSA (obstructive sleep apnea)   RECOMMENDATION:  This home sleep test demonstrates overall mild obstructive sleep apnea with a total AHI of 5.8/hour and O2 nadir of 78%. Snoring was detected, in the mild to moderate range, at times intermittent.  Ongoing treatment with AutoPap therapy is recommended.  The patient should be eligible for new equipment.  Previous AutoPap settings can be used as they provided adequate apnea treatment. A full night, in-lab PAP titration study may aid in improving proper treatment settings and with mask fit, if needed, down the road. Alternative treatments may  include weight loss (where appropriate) along with avoidance of the supine sleep position (if possible), or an oral appliance in appropriate candidates.   Please note that untreated obstructive sleep apnea may carry additional perioperative morbidity. Patients with significant obstructive sleep apnea should receive perioperative PAP therapy and the surgeons and particularly the anesthesiologist should be informed of the diagnosis and the severity of the sleep disordered breathing. The patient should be cautioned not to drive, work at heights, or operate dangerous or heavy equipment when tired or sleepy. Review and reiteration of good sleep hygiene measures should be pursued with any patient. Other causes of the patient's symptoms, including circadian rhythm disturbances, an underlying mood disorder, medication effect and/or an underlying medical problem cannot be ruled out based on this test. Clinical correlation is recommended.  The patient and his referring provider will be notified of the test results. The patient will be seen in follow up in sleep clinic at [facility], as necessary.  I certify that I have reviewed the raw data recording prior to the issuance of this report in accordance with the standards of the American Academy of Sleep Medicine (AASM).  INTERPRETING PHYSICIAN:   Star Age, MD, PhD Medical Director, [facility]) [place], ABPN (Neurology and Sleep)   [facility] [address] [place], [place] [address] [phone]

## 2022-06-26 ENCOUNTER — Telehealth: Payer: Self-pay

## 2022-06-26 ENCOUNTER — Other Ambulatory Visit: Payer: Self-pay | Admitting: Family Medicine

## 2022-06-26 DIAGNOSIS — G4733 Obstructive sleep apnea (adult) (pediatric): Secondary | ICD-10-CM

## 2022-08-10 ENCOUNTER — Other Ambulatory Visit: Payer: Self-pay | Admitting: Thoracic Surgery (Cardiothoracic Vascular Surgery)

## 2022-08-10 DIAGNOSIS — I7121 Aneurysm of the ascending aorta, without rupture: Secondary | ICD-10-CM

## 2022-08-14 ENCOUNTER — Ambulatory Visit (HOSPITAL_COMMUNITY): Payer: 59 | Attending: Thoracic Surgery (Cardiothoracic Vascular Surgery)

## 2022-08-14 DIAGNOSIS — I7121 Aneurysm of the ascending aorta, without rupture: Secondary | ICD-10-CM | POA: Insufficient documentation

## 2022-08-14 LAB — ECHOCARDIOGRAM COMPLETE
AR max vel: 1.88 cm2
AV Area VTI: 1.83 cm2
AV Area mean vel: 1.77 cm2
AV Mean grad: 20 mmHg
AV Peak grad: 34.6 mmHg
Ao pk vel: 2.94 m/s
Area-P 1/2: 3.21 cm2
S' Lateral: 2.4 cm

## 2022-10-16 ENCOUNTER — Ambulatory Visit: Payer: Self-pay | Admitting: Thoracic Surgery (Cardiothoracic Vascular Surgery)

## 2022-10-23 ENCOUNTER — Ambulatory Visit: Payer: Self-pay | Admitting: Thoracic Surgery (Cardiothoracic Vascular Surgery)

## 2022-10-23 ENCOUNTER — Ambulatory Visit
Admission: RE | Admit: 2022-10-23 | Discharge: 2022-10-23 | Disposition: A | Discharge status: Home / Self Care | Payer: 59 | Source: Ambulatory Visit | Attending: Thoracic Surgery (Cardiothoracic Vascular Surgery) | Admitting: Thoracic Surgery (Cardiothoracic Vascular Surgery)

## 2022-10-23 DIAGNOSIS — I7121 Aneurysm of the ascending aorta, without rupture: Secondary | ICD-10-CM

## 2022-10-23 MED ORDER — IOPAMIDOL (ISOVUE-370) INJECTION 76%
75.0000 mL | Freq: Once | INTRAVENOUS | Status: AC | PRN
  Administered 2022-10-23: 75 mL via INTRAVENOUS

## 2022-10-29 ENCOUNTER — Other Ambulatory Visit: Payer: Self-pay | Admitting: Sports Medicine

## 2022-10-30 ENCOUNTER — Encounter: Payer: Self-pay | Admitting: Thoracic Surgery (Cardiothoracic Vascular Surgery)

## 2022-10-30 ENCOUNTER — Ambulatory Visit (INDEPENDENT_AMBULATORY_CARE_PROVIDER_SITE_OTHER): Payer: 59 | Admitting: Thoracic Surgery (Cardiothoracic Vascular Surgery)

## 2022-10-30 VITALS — BP 127/80 | HR 69 | Resp 20 | Ht 66.0 in | Wt 161.0 lb

## 2022-10-30 DIAGNOSIS — Q231 Congenital insufficiency of aortic valve: Secondary | ICD-10-CM | POA: Diagnosis not present

## 2022-10-30 DIAGNOSIS — I7121 Aneurysm of the ascending aorta, without rupture: Secondary | ICD-10-CM

## 2022-10-30 DIAGNOSIS — R918 Other nonspecific abnormal finding of lung field: Secondary | ICD-10-CM

## 2022-10-30 NOTE — Progress Notes (Signed)
[address]       [name] [address]             [phone]     HPI: [name] returns for follow-up of his bicuspid aortic valve and ascending aneurysm.  [name] is a 67 year old man with a history of stage IV prostate cancer, ulcerative colitis, sleep apnea, hyperlipidemia, degenerative joint disease, empyema, ascending aneurysm, and a bicuspid aortic valve with mild to moderate aortic stenosis.  He had a left VATS for drainage of empyema in March 2018.  Noted to have a 4.1 cm ascending aneurysm on CT.  I last saw him in September 2023.  His echocardiogram showed mean gradient of 11 peak gradient of 21 and calculated valve area of 1.51 cm.  Aneurysm was measured at 4.3 cm on echo.  In the interim since his last visit he has been doing well.  No anginal type chest pain, pressure, or tightness.  No shortness of breath with exertion.  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   Neoplasm of face    benign   Pneumonia    left lung with a loculated effusion    Prostate cancer    Recurrent HSV (herpes simplex virus)    Sleep apnea    Ulcerative colitis     Current Outpatient Medications  Medication Sig Dispense Refill   acetaminophen (TYLENOL) 325 MG tablet Take 2 tablets (650 mg total) by mouth every 6 (six) hours.     ascorbic acid (VITAMIN C) 500 MG tablet Take 500 mg by mouth daily.     balsalazide (COLAZAL) 750 MG capsule TAKE 1 CAPSULE(750 MG) BY MOUTH TWICE DAILY 60 capsule 2   Calcium-Phosphorus-Vitamin D (CITRACAL +D3 PO) Take 2 tablets by mouth daily.     celecoxib (CELEBREX) 200 MG capsule Take 200 mg by mouth 2 (two) times daily.     Cholecalciferol (VITAMIN D3) 250 MCG (10000 UT) capsule Take 10,000 Units by mouth daily.     diazepam (VALIUM) 5 MG tablet Take 2.5 mg by mouth at bedtime.      ezetimibe (ZETIA) 10 MG tablet Take 10 mg by mouth at bedtime.     Glucosamine-Chondroitin 250-200 MG CAPS Take 2 tablets by mouth daily.     Leuprolide Acetate, 3 Month, (LUPRON DEPOT, 60-MONTH, IM) Inject into the muscle every 3 (three) months.     Lycopene 10 MG CAPS Take 10 mg by mouth daily.     Multiple Vitamin (MULTIVITAMIN) tablet Take 1 tablet by mouth daily.     nystatin cream (MYCOSTATIN) Apply 1 Application topically daily. Apply to corners of mouths     OMEGA-3 FATTY ACIDS PO Take 500 mg by mouth daily.  250 mg per cap     oxyCODONE (ROXICODONE) 5 MG immediate release tablet Take 1 tablet (5 mg total) by mouth every 6 (six) hours as needed for severe pain. 20 tablet 0   predniSONE (DELTASONE) 5 MG tablet Take 5 mg by mouth daily.  4   rosuvastatin (CRESTOR) 40 MG tablet Take 40 mg by mouth at bedtime.     solifenacin (VESICARE) 5 MG tablet Take 5 mg by mouth every evening.     Turmeric 500 MG CAPS Take 500 mg by mouth daily.  valACYclovir (VALTREX) 500 MG tablet Take 500 mg by mouth 2 (two) times daily as needed (breakouts).     zinc gluconate 50 MG tablet Take 50 mg by mouth daily.     ZYTIGA 250 MG tablet Take 1,000 mg by mouth daily. About Noon  4   No current facility-administered medications for this visit.    Physical Exam BP 127/80 (BP Location: Left Arm, Patient Position: Sitting)   Pulse 69   Resp 20   Ht 5\' 6"  (1.676 m)   Wt 161 lb (73 kg)   SpO2 97% Comment: RA  BMI 25.99 kg/m  Well-appearing 67 year old man in no acute distress Alert and oriented x 3 with no focal deficits No carotid bruits Cardiac regular rate and rhythm with 2/6 systolic murmur Lungs clear bilaterally No peripheral edema  Diagnostic Tests:  Echocardiogram 08/14/2022 IMPRESSIONS    1. Left ventricular ejection fraction, by estimation, is 60 to 65%. Left  ventricular ejection fraction by 3D volume is 63 %. The left ventricle has  normal function. The left ventricle has no regional  wall motion  abnormalities. Left ventricular diastolic   parameters are consistent with Grade I diastolic dysfunction (impaired  relaxation).   2. Right ventricular systolic function is low normal. The right  ventricular size is normal. There is normal pulmonary artery systolic  pressure. The estimated right ventricular systolic pressure is 20.6 mmHg.   3. The mitral valve is abnormal. Trivial mitral valve regurgitation.   4. The aortic valve is bicuspid. Aortic valve regurgitation is trivial.  Mild aortic valve stenosis. Aortic valve area, by VTI measures 1.83 cm.  Aortic valve mean gradient measures 20.0 mmHg. Aortic valve Vmax measures  2.94 m/s. Peak gradient 34.6 mmHg,   DI is 0.41.   5. Aortic dilatation noted. There is mild dilatation of the aortic root,  measuring 44 mm. There is mild dilatation of the ascending aorta,  measuring 42 mm.   6. The inferior vena cava is normal in size with greater than 50%  respiratory variability, suggesting right atrial pressure of 3 mmHg.   Comparison(s): Changes from prior study are noted. 03/23/2022: LVEF 60-65%,  mild to moderate AS.   CT ANGIOGRAPHY CHEST WITH CONTRAST   TECHNIQUE: Multidetector CT imaging of the chest was performed using the standard protocol during bolus administration of intravenous contrast. Multiplanar CT image reconstructions and MIPs were obtained to evaluate the vascular anatomy.   RADIATION DOSE REDUCTION: This exam was performed according to the departmental dose-optimization program which includes automated exposure control, adjustment of the mA and/or kV according to patient size and/or use of iterative reconstruction technique.   CONTRAST:  75mL ISOVUE-370 IOPAMIDOL (ISOVUE-370) INJECTION 76%   COMPARISON:  September 12, 2021.   FINDINGS: Cardiovascular: Grossly stable 4.2 cm ascending thoracic aortic aneurysm. No dissection is noted. Great vessels are widely patent. Normal cardiac size. No pericardial  effusion.   Mediastinum/Nodes: No enlarged mediastinal, hilar, or axillary lymph nodes. Thyroid gland, trachea, and esophagus demonstrate no significant findings.   Lungs/Pleura: Lungs are clear. No pleural effusion or pneumothorax.   Upper Abdomen: No acute abnormality.   Musculoskeletal: No chest wall abnormality. No acute or significant osseous findings.   Review of the MIP images confirms the above findings.   IMPRESSION: Grossly stable 4.2 cm ascending thoracic aortic aneurysm. Recommend annual imaging followup by CTA or MRA. This recommendation follows 2010 ACCF/AHA/AATS/ACR/ASA/SCA/SCAI/SIR/STS/SVM Guidelines for the Diagnosis and Management of Patients with Thoracic Aortic Disease. Circulation. 2010; 121: X914-N829. Aortic aneurysm  NOS (ICD10-I71.9).     Electronically Signed   By: Lupita Raider M.D.   On: 10/23/2022 10:09 I personally reviewed the CT and echo images and reviewed the reports.  There is a stable 4.2 cm ascending aneurysm.  Postop changes from previous decortication with some mild volume loss on the left.  Echo shows preserved ventricular function.  Gradients are slightly higher but calculated valve area lower than previous echo.  Impression: [name] is a 67 year old man with a history of stage IV prostate cancer, ulcerative colitis, sleep apnea, hyperlipidemia, degenerative joint disease, empyema, ascending aneurysm, and a bicuspid aortic valve with mild to moderate aortic stenosis.  Ascending aneurysm-stable at 4.2 cm.  Needs continued annual follow-up.  He is aware the importance of blood pressure control.  Bicuspid aortic valve with mild to moderate aortic stenosis-stable and asymptomatic.  Will plan to repeat an echocardiogram in a year unless symptoms indicate need for an earlier study.  Hyperlipidemia.  He is on Zetia.  Plan: Return in 1 year with 2D echocardiogram and CT angio of chest  Loreli Slot, MD [facility] [phone]

## 2023-04-04 ENCOUNTER — Other Ambulatory Visit: Payer: Self-pay | Admitting: *Deleted

## 2023-04-04 DIAGNOSIS — M79606 Pain in leg, unspecified: Secondary | ICD-10-CM

## 2023-04-05 ENCOUNTER — Other Ambulatory Visit: Payer: Self-pay | Admitting: Vascular Surgery

## 2023-04-05 ENCOUNTER — Encounter: Payer: Self-pay | Admitting: Vascular Surgery

## 2023-04-05 ENCOUNTER — Ambulatory Visit (INDEPENDENT_AMBULATORY_CARE_PROVIDER_SITE_OTHER): Payer: 59 | Admitting: Vascular Surgery

## 2023-04-05 ENCOUNTER — Ambulatory Visit (HOSPITAL_COMMUNITY)
Admission: RE | Admit: 2023-04-05 | Discharge: 2023-04-05 | Disposition: A | Discharge status: Home / Self Care | Payer: 59 | Source: Ambulatory Visit | Attending: Vascular Surgery | Admitting: Vascular Surgery

## 2023-04-05 VITALS — BP 129/86 | HR 57 | Temp 98.0°F | Resp 20 | Ht 66.0 in | Wt 162.0 lb

## 2023-04-05 DIAGNOSIS — I8390 Asymptomatic varicose veins of unspecified lower extremity: Secondary | ICD-10-CM

## 2023-04-05 DIAGNOSIS — I781 Nevus, non-neoplastic: Secondary | ICD-10-CM | POA: Diagnosis not present

## 2023-04-05 DIAGNOSIS — M79606 Pain in leg, unspecified: Secondary | ICD-10-CM | POA: Diagnosis present

## 2023-04-05 DIAGNOSIS — I8391 Asymptomatic varicose veins of right lower extremity: Secondary | ICD-10-CM | POA: Diagnosis not present

## 2023-04-05 DIAGNOSIS — I8393 Asymptomatic varicose veins of bilateral lower extremities: Secondary | ICD-10-CM

## 2023-04-05 DIAGNOSIS — I872 Venous insufficiency (chronic) (peripheral): Secondary | ICD-10-CM

## 2023-04-16 ENCOUNTER — Ambulatory Visit
Admission: RE | Admit: 2023-04-16 | Discharge: 2023-04-16 | Disposition: A | Discharge status: Home / Self Care | Payer: 59 | Source: Ambulatory Visit | Attending: General Practice | Admitting: General Practice

## 2023-04-16 ENCOUNTER — Other Ambulatory Visit: Payer: Self-pay | Admitting: General Practice

## 2023-04-16 DIAGNOSIS — M542 Cervicalgia: Secondary | ICD-10-CM

## 2023-09-13 ENCOUNTER — Other Ambulatory Visit: Payer: Self-pay | Admitting: Thoracic Surgery (Cardiothoracic Vascular Surgery)

## 2023-09-13 DIAGNOSIS — I7121 Aneurysm of the ascending aorta, without rupture: Secondary | ICD-10-CM

## 2023-10-09 ENCOUNTER — Ambulatory Visit (HOSPITAL_COMMUNITY): Attending: Cardiology

## 2023-10-09 DIAGNOSIS — I7121 Aneurysm of the ascending aorta, without rupture: Secondary | ICD-10-CM

## 2023-10-09 LAB — ECHOCARDIOGRAM COMPLETE
AR max vel: 1.16 cm2
AV Area VTI: 1.03 cm2
AV Area mean vel: 1.06 cm2
AV Mean grad: 20 mmHg
AV Peak grad: 36.7 mmHg
Ao pk vel: 3.03 m/s
Area-P 1/2: 2.54 cm2
Est EF: 55
S' Lateral: 3.2 cm

## 2023-10-12 ENCOUNTER — Encounter: Payer: Self-pay | Admitting: Thoracic Surgery (Cardiothoracic Vascular Surgery)

## 2023-10-14 ENCOUNTER — Encounter: Payer: Self-pay | Admitting: Thoracic Surgery (Cardiothoracic Vascular Surgery)

## 2023-10-21 ENCOUNTER — Other Ambulatory Visit

## 2023-10-28 ENCOUNTER — Ambulatory Visit

## 2023-11-05 ENCOUNTER — Ambulatory Visit
Admission: RE | Admit: 2023-11-05 | Discharge: 2023-11-05 | Disposition: A | Discharge status: Home / Self Care | Source: Ambulatory Visit | Attending: Thoracic Surgery (Cardiothoracic Vascular Surgery) | Admitting: Thoracic Surgery (Cardiothoracic Vascular Surgery)

## 2023-11-05 DIAGNOSIS — I7121 Aneurysm of the ascending aorta, without rupture: Secondary | ICD-10-CM

## 2023-11-05 MED ORDER — IOPAMIDOL (ISOVUE-370) INJECTION 76%
500.0000 mL | Freq: Once | INTRAVENOUS | Status: AC | PRN
  Administered 2023-11-05: 75 mL via INTRAVENOUS

## 2023-11-06 NOTE — Patient Instructions (Signed)

## 2023-11-06 NOTE — Progress Notes (Unsigned)
 [address]       [place] [address]             [phone]        JOSPEH SUTHERLAND [id number] 1956-02-03  History of Present Illness: Mr. Merkel is a 68 year old male with a past medical history of stage IV prostate cancer, metastatic cancer to the bone, ulcerative colitis, sleep apnea, hyperlipidemia, degenerative joint disease, empyema, ascending aneurysm, and a bicuspid aortic valve with mild to moderate aortic stenosis. He had a left VATS by Dr. Luna Salinas for empyema drainage in 2018 and was noted to have a 4.1cm ascending aneurysm. He has been followed by Dr. Luna Salinas since 2018 and aneurysm has remained relatively stable maximally measuring 4.2cm.  Today he reports chest pain every once in a while but it is not consistent with exertion. He admits to chronic fatigue that he attributes to his prostate cancer treatment but has not worsened and lightheadedness when standing up too fast. He denies shortness of breath, LOC, and lower extremity edema but he does admit to varicose veins. He states his new PCP is getting a cardiac CT scan soon.  He continues to remain active walking 1-24miles every morning and biking in the afternoons without difficulty.   Current Outpatient Medications on File Prior to Visit  Medication Sig Dispense Refill   acetaminophen  (TYLENOL ) 325 MG tablet Take 2 tablets (650 mg total) by mouth every 6 (six) hours.     ascorbic acid (VITAMIN C) 500 MG tablet Take 500 mg by mouth daily.     balsalazide (COLAZAL ) 750 MG capsule TAKE 1 CAPSULE(750 MG) BY MOUTH TWICE DAILY 60 capsule 2   Calcium-Phosphorus-Vitamin D (CITRACAL +D3 PO) Take 2 tablets by mouth daily.     celecoxib (CELEBREX) 200 MG capsule Take 200 mg by mouth 2 (two) times daily.     Cholecalciferol (VITAMIN D3) 250 MCG (10000 UT) capsule Take 10,000 Units by mouth daily.     diazepam (VALIUM) 5 MG tablet Take 2.5 mg by mouth at bedtime.     ezetimibe (ZETIA) 10 MG tablet Take 10 mg  by mouth at bedtime.     Glucosamine-Chondroitin 250-200 MG CAPS Take 2 tablets by mouth daily.     Leuprolide  Acetate, 3 Month, (LUPRON  DEPOT, 32-MONTH, IM) Inject into the muscle every 3 (three) months.     Lycopene 10 MG CAPS Take 10 mg by mouth daily.     Multiple Vitamin (MULTIVITAMIN) tablet Take 1 tablet by mouth daily.     nystatin  cream (MYCOSTATIN ) Apply 1 Application topically daily. Apply to corners of mouths     OMEGA-3 FATTY ACIDS PO Take 500 mg by mouth daily.  250 mg per cap     oxyCODONE  (ROXICODONE ) 5 MG immediate release tablet Take 1 tablet (5 mg total) by mouth every 6 (six) hours as needed for severe pain. 20 tablet 0   predniSONE  (DELTASONE ) 5 MG tablet Take 5 mg by mouth daily.  4   rosuvastatin (CRESTOR) 40 MG tablet Take 40 mg by mouth at bedtime.     solifenacin (VESICARE) 5 MG tablet Take 5 mg by mouth every evening.     Turmeric 500 MG CAPS Take 500 mg by mouth daily.     valACYclovir  (VALTREX ) 500 MG tablet Take 500 mg by mouth 2 (two) times daily as needed (breakouts).     zinc gluconate 50 MG tablet Take 50 mg by mouth daily.  ZYTIGA 250 MG tablet Take 1,000 mg by mouth daily. About Noon  4   No current facility-administered medications on file prior to visit.   Vitals: Today's Vitals   11/12/23 1530  BP: 99/63  Pulse: 77  Resp: 18  SpO2: 97%  Weight: 168 lb (76.2 kg)  Height: 5\' 6"  (1.676 m)   Body mass index is 27.12 kg/m.  Physical Exam General: Alert and oriented Neuro: Grossly intact CV: Regular rate and rhythm, 3/6 systolic murmur Pulm: Clear to auscultation bilaterally GI: +BS, nontender Extremities: No edema, 2+ radial pulses bilaterally  CTA Results:    ECHOCARDIOGRAM REPORT       Patient Name:   CORION GUINAN Date of Exam: 10/09/2023  Medical Rec #:  [id number]     Height:       66.0 in  Accession #:    [id number]    Weight:       162.0 lb  Date of Birth:  1956/05/15     BSA:          1.828 m  Patient Age:    67 years      BP:            129/86 mmHg  Patient Gender: M             HR:           56 bpm.  Exam Location:  [address]   Procedure: 2D Echo, Cardiac Doppler, Color Doppler, 3D Echo and Strain  Analysis            (Both Spectral and Color Flow Doppler were utilized during             procedure).   Indications:    Aortic Stenosis I35.0    History:        Patient has prior history of Echocardiogram examinations,  most                 recent 08/14/2022. Risk Factors:Dyslipidemia.    Sonographer:    Joleen Navy RDCS  Referring Phys: [id number] STEVEN C HENDRICKSON   IMPRESSIONS     1. Left ventricular ejection fraction, by estimation, is 55%. The left  ventricle has normal function. The left ventricle has no regional wall  motion abnormalities. Left ventricular diastolic parameters are consistent  with Grade I diastolic dysfunction  (impaired relaxation). The average left ventricular global longitudinal  strain is -18.9 %. The global longitudinal strain is normal.   2. Right ventricular systolic function is mildly reduced. The right  ventricular size is normal. There is normal pulmonary artery systolic  pressure. The estimated right ventricular systolic pressure is 23.4 mmHg.   3. Left atrial size was mildly dilated.   4. Right atrial size was mildly dilated.   5. The mitral valve is normal in structure. No evidence of mitral valve  regurgitation. No evidence of mitral stenosis.   6. The aortic valve is bicuspid. There is moderate calcification of the  aortic valve. Aortic valve regurgitation is not visualized. Moderate  aortic valve stenosis. Aortic valve area, by VTI measures 1.03 cm. Aortic  valve mean gradient measures 20.0  mmHg.   7. Aortic dilatation noted. There is mild dilatation of the aortic root,  measuring 43 mm. There is mild dilatation of the ascending aorta,  measuring 43 mm.   8. The inferior vena cava is normal in size with greater than 50%  respiratory variability,  suggesting right atrial pressure of 3 mmHg.  FINDINGS   Left Ventricle: Left ventricular ejection fraction, by estimation, is  55%. The left ventricle has normal function. The left ventricle has no  regional wall motion abnormalities. The average left ventricular global  longitudinal strain is -18.9 %. Strain  was performed and the global longitudinal strain is normal. The left  ventricular internal cavity size was normal in size. There is no left  ventricular hypertrophy. Left ventricular diastolic parameters are  consistent with Grade I diastolic dysfunction  (impaired relaxation).   Right Ventricle: The right ventricular size is normal. No increase in  right ventricular wall thickness. Right ventricular systolic function is  mildly reduced. There is normal pulmonary artery systolic pressure. The  tricuspid regurgitant velocity is 2.26  m/s, and with an assumed right atrial pressure of 3 mmHg, the estimated  right ventricular systolic pressure is 23.4 mmHg.   Left Atrium: Left atrial size was mildly dilated.   Right Atrium: Right atrial size was mildly dilated.   Pericardium: There is no evidence of pericardial effusion.   Mitral Valve: The mitral valve is normal in structure. No evidence of  mitral valve regurgitation. No evidence of mitral valve stenosis.   Tricuspid Valve: The tricuspid valve is normal in structure. Tricuspid  valve regurgitation is mild.   Aortic Valve: The aortic valve is bicuspid. There is moderate  calcification of the aortic valve. Aortic valve regurgitation is not  visualized. Moderate aortic stenosis is present. Aortic valve mean  gradient measures 20.0 mmHg. Aortic valve peak gradient  measures 36.7 mmHg. Aortic valve area, by VTI measures 1.03 cm.   Pulmonic Valve: The pulmonic valve was normal in structure. Pulmonic valve  regurgitation is not visualized.   Aorta: Aortic dilatation noted. There is mild dilatation of the aortic  root,  measuring 43 mm. There is mild dilatation of the ascending aorta,  measuring 43 mm.   Venous: The inferior vena cava is normal in size with greater than 50%  respiratory variability, suggesting right atrial pressure of 3 mmHg.   IAS/Shunts: No atrial level shunt detected by color flow Doppler.   Additional Comments: 3D was performed not requiring image post processing  on an independent workstation and was normal.     LEFT VENTRICLE  PLAX 2D  LVIDd:         4.70 cm   Diastology  LVIDs:         3.20 cm   LV e' medial:    9.25 cm/s  LV PW:         0.80 cm   LV E/e' medial:  6.5  LV IVS:        0.90 cm   LV e' lateral:   9.68 cm/s  LVOT diam:     2.20 cm   LV E/e' lateral: 6.2  LV SV:         81  LV SV Index:   44        2D Longitudinal Strain  LVOT Area:     3.80 cm  2D Strain GLS (A4C):   -19.6 %                           2D Strain GLS (A3C):   -17.0 %                           2D Strain GLS (A2C):   -20.2 %  2D Strain GLS Avg:     -18.9 %                             3D Volume EF:                           3D EF:        61 %                           LV EDV:       142 ml                           LV ESV:       55 ml                           LV SV:        87 ml   RIGHT VENTRICLE  RV Basal diam:  3.10 cm  RV Mid diam:    2.40 cm  RV S prime:     9.68 cm/s  TAPSE (M-mode): 2.2 cm   LEFT ATRIUM             Index        RIGHT ATRIUM           Index  LA diam:        2.70 cm 1.48 cm/m   RA Area:     19.00 cm  LA Vol (A2C):   69.9 ml 38.23 ml/m  RA Volume:   52.60 ml  28.77 ml/m  LA Vol (A4C):   52.3 ml 28.60 ml/m  LA Biplane Vol: 63.0 ml 34.45 ml/m   AORTIC VALVE  AV Area (Vmax):    1.16 cm  AV Area (Vmean):   1.06 cm  AV Area (VTI):     1.03 cm  AV Vmax:           303.00 cm/s  AV Vmean:          209.000 cm/s  AV VTI:            0.793 m  AV Peak Grad:      36.7 mmHg  AV Mean Grad:      20.0 mmHg  LVOT Vmax:         92.20 cm/s  LVOT  Vmean:        58.200 cm/s  LVOT VTI:          0.214 m  LVOT/AV VTI ratio: 0.27    AORTA  Ao Root diam: 4.30 cm  Ao Asc diam:  4.30 cm   MITRAL VALVE               TRICUSPID VALVE  MV Area (PHT): 2.54 cm    TR Peak grad:   20.4 mmHg  MV Decel Time: 299 msec    TR Vmax:        226.00 cm/s  MV E velocity: 60.30 cm/s  MV A velocity: 58.20 cm/s  SHUNTS  MV E/A ratio:  1.04        Systemic VTI:  0.21 m                             Systemic Diam: 2.20  cm   Dalton McleanMD  Electronically signed by Archer Bear  Signature Date/Time: 10/09/2023/9:29:35 AM        Final     CLINICAL DATA:  Aortic aneurysm suspected. Follow-up thoracic aortic aneurysm. History of prostate cancer with bone metastases 10 years ago post radiation and chemotherapy.   EXAM: CT ANGIOGRAPHY CHEST WITH CONTRAST   TECHNIQUE: Multidetector CT imaging of the chest was performed using the standard protocol during bolus administration of intravenous contrast. Multiplanar CT image reconstructions and MIPs were obtained to evaluate the vascular anatomy.   RADIATION DOSE REDUCTION: This exam was performed according to the departmental dose-optimization program which includes automated exposure control, adjustment of the mA and/or kV according to patient size and/or use of iterative reconstruction technique.   CONTRAST:  75mL ISOVUE -370 IOPAMIDOL  (ISOVUE -370) INJECTION 76%   COMPARISON:  10/23/2022   FINDINGS: Cardiovascular: Motion artifact in the aortic root. No evidence of aortic dissection. Thoracic aortic aneurysm measuring 4.2 cm diameter. No change since previous study. Great vessel origins are patent. Minimal calcification in the aorta and coronary arteries. Normal heart size. No pericardial effusions. Central pulmonary arteries are well opacified without evidence of significant pulmonary embolus.   Mediastinum/Nodes: Thyroid gland is unremarkable. Esophagus is decompressed. No significant  lymphadenopathy.   Lungs/Pleura: Emphysematous changes in the lungs. Mild dependent atelectasis. 5 mm nodule in the superior segment right lower lung, series 6, image 67. No significant change since prior study. No pleural effusion or pneumothorax.   Upper Abdomen: No acute abnormalities.   Musculoskeletal: Degenerative changes in the spine. No acute bony abnormalities. No focal sclerotic bone lesions.   Review of the MIP images confirms the above findings.   IMPRESSION: 1. Thoracic aortic aneurysm measuring 4.2 cm diameter. No significant change. Recommend annual imaging followup by CTA or MRA. This recommendation follows 2010 ACCF/AHA/AATS/ACR/ASA/SCA/SCAI/SIR/STS/SVM Guidelines for the Diagnosis and Management of Patients with Thoracic Aortic Disease. Circulation. 2010; 121: B147-W295. Aortic aneurysm NOS (ICD10-I71.9) 2. No aortic dissection.  Aortic atherosclerosis. 3. No evidence of significant pulmonary embolus. 4. Emphysematous changes and fibrosis in the lungs. 5. 5 mm nodule in the right lower lung, unchanged. By size criteria this is likely benign and no imaging follow-up is indicated.     Electronically Signed   By: Boyce Byes M.D.   On: 11/05/2023 16:27  Impression and Plan: ATAA: Mr. Lauture presents to the clinic with a stable 4.2 cm ascending aortic aneurysm.  Echocardiogram on 10/09/23 shows a bicuspid aortic valve with moderate aortic valve stenosis but without evidence of regurgitation. We discussed the natural history and and risk factors for growth of ascending aortic aneurysms. We covered the importance of tight blood pressure control, refraining from lifting heavy objects, and avoiding fluoroquinolones. The patient is aware of signs and symptoms of aortic dissection and when to present to the emergency department. He does not yet meet surgical threshold of 5.5cm.  We will continue surveillance, plan to have the patient return to the clinic with chest CTA in  1 year.   Aortic valve stenosis: Moderate aortic valve stenosis with bicuspid aortic valve, worsened on echocardiogram from 1 year ago. Plan to have the patient return to the clinic to see Dr. Luna Salinas in 6 months with echocardiogram.   Nodule: 5mm nodule in RLL likely benign, no follow up indicated  Aortic atherosclerosis: Continue Rosuvastatin, PCP is getting a cardiac CT  Risk Modification:  Statin: Cholesterol is controlled, has discontinued statin. Defer to PCP.  Smoking cessation instruction/counseling given:  nonsmoker  Patient was counseled on importance of Blood Pressure Control.  Despite Medical intervention if the patient notices persistently elevated blood pressure readings.  They are instructed to contact their Primary Care Physician  Please avoid use of Fluoroquinolones as this can potentially increase your risk of Aortic Rupture and/or Dissection  Patient educated on signs and symptoms of Aortic Dissection, handout also provided in AVS  Randa Burton, PA-C 11/06/23

## 2023-11-12 ENCOUNTER — Ambulatory Visit: Attending: Thoracic Surgery (Cardiothoracic Vascular Surgery) | Admitting: Physician Assistant

## 2023-11-12 ENCOUNTER — Encounter: Payer: Self-pay | Admitting: Physician Assistant

## 2023-11-12 VITALS — BP 99/63 | HR 77 | Resp 18 | Ht 66.0 in | Wt 168.0 lb

## 2023-11-12 DIAGNOSIS — I7121 Aneurysm of the ascending aorta, without rupture: Secondary | ICD-10-CM | POA: Diagnosis not present

## 2024-04-15 ENCOUNTER — Other Ambulatory Visit: Payer: Self-pay | Admitting: Thoracic Surgery (Cardiothoracic Vascular Surgery)

## 2024-04-15 DIAGNOSIS — Q2381 Bicuspid aortic valve: Secondary | ICD-10-CM

## 2024-04-15 DIAGNOSIS — I7121 Aneurysm of the ascending aorta, without rupture: Secondary | ICD-10-CM

## 2024-04-29 ENCOUNTER — Other Ambulatory Visit: Payer: Self-pay | Admitting: Thoracic Surgery (Cardiothoracic Vascular Surgery)

## 2024-04-29 DIAGNOSIS — I359 Nonrheumatic aortic valve disorder, unspecified: Secondary | ICD-10-CM

## 2024-04-29 DIAGNOSIS — I7121 Aneurysm of the ascending aorta, without rupture: Secondary | ICD-10-CM

## 2024-04-29 DIAGNOSIS — Q2381 Bicuspid aortic valve: Secondary | ICD-10-CM

## 2024-05-06 ENCOUNTER — Ambulatory Visit (HOSPITAL_BASED_OUTPATIENT_CLINIC_OR_DEPARTMENT_OTHER)
Admission: RE | Admit: 2024-05-06 | Discharge: 2024-05-06 | Disposition: A | Discharge status: Home / Self Care | Source: Ambulatory Visit | Attending: Thoracic Surgery (Cardiothoracic Vascular Surgery) | Admitting: Thoracic Surgery (Cardiothoracic Vascular Surgery)

## 2024-05-06 DIAGNOSIS — I7121 Aneurysm of the ascending aorta, without rupture: Secondary | ICD-10-CM | POA: Insufficient documentation

## 2024-05-06 DIAGNOSIS — I359 Nonrheumatic aortic valve disorder, unspecified: Secondary | ICD-10-CM | POA: Insufficient documentation

## 2024-05-06 DIAGNOSIS — I350 Nonrheumatic aortic (valve) stenosis: Secondary | ICD-10-CM | POA: Diagnosis not present

## 2024-05-07 LAB — ECHOCARDIOGRAM COMPLETE
AR max vel: 1.59 cm2
AV Area VTI: 1.86 cm2
AV Area mean vel: 1.72 cm2
AV Mean grad: 16.5 mmHg
AV Peak grad: 28.5 mmHg
Ao pk vel: 2.67 m/s
Area-P 1/2: 2.82 cm2
Calc EF: 64.6 %
MV M vel: 3.17 m/s
MV Peak grad: 40.2 mmHg
S' Lateral: 3.2 cm
Single Plane A2C EF: 65.3 %
Single Plane A4C EF: 63.9 %

## 2024-05-26 ENCOUNTER — Ambulatory Visit: Admitting: Thoracic Surgery (Cardiothoracic Vascular Surgery)

## 2024-06-23 ENCOUNTER — Ambulatory Visit
Attending: Thoracic Surgery (Cardiothoracic Vascular Surgery) | Admitting: Thoracic Surgery (Cardiothoracic Vascular Surgery)

## 2024-06-23 VITALS — BP 128/82 | HR 66 | Resp 18 | Ht 66.0 in | Wt 176.0 lb

## 2024-06-23 DIAGNOSIS — R918 Other nonspecific abnormal finding of lung field: Secondary | ICD-10-CM

## 2024-06-23 DIAGNOSIS — Q2381 Bicuspid aortic valve: Secondary | ICD-10-CM

## 2024-06-23 DIAGNOSIS — I7121 Aneurysm of the ascending aorta, without rupture: Secondary | ICD-10-CM

## 2024-06-23 NOTE — Progress Notes (Signed)
 [address], [place] [address]             [phone]    HPI: [name] returns for follow-up regarding mild to moderate aortic stenosis.  [name] is a 68 year old man with a history of stage IV prostate cancer, bone metastases, ulcerative colitis, sleep apnea, hyperlipidemia, degenerative joint disease, empyema, cuspid aortic valve, mild to moderate aortic stenosis, ascending aneurysm, and empyema.  He has been followed for an ascending aneurysm.  Was originally 4.1 cm when found in 2018.  Most recently measured about 4.2 to 4.3 cm.  Also has been monitored with echocardiogram for a bicuspid aortic valve with mild to moderate aortic stenosis.  He was last in the office in May 2025.  Echocardiogram indicated fairly significant progression of his aortic stenosis.  He now returns for a 65-month follow-up.  Overall he feels well.  He still working but he is managing his schedule.  He has started an exercise program and does get short of breath with heavy exertion particularly with weights.  Can walk for long distances without problems.  Occasionally will have a sharp chest pain but it is not associated with exertion or or eating.  Past Medical History:  Diagnosis Date   Allergy    Anxiety    Arthritis    Hepatic steatosis 2016   Hyperlipidemia    Intermittent depression    Metastatic cancer to bone (HCC)    left rib.   Neoplasm of face    benign   Pneumonia    left lung with a loculated effusion    Prostate cancer (HCC)    Recurrent HSV (herpes simplex virus)    Sleep apnea    Ulcerative colitis     Current Outpatient Medications  Medication Sig Dispense Refill   acetaminophen  (TYLENOL ) 325 MG tablet Take 2 tablets (650 mg total) by mouth every 6 (six) hours.     ascorbic acid (VITAMIN C) 500 MG tablet Take 500 mg by mouth daily.     balsalazide (COLAZAL ) 750 MG capsule TAKE 1 CAPSULE(750 MG) BY MOUTH TWICE DAILY 60 capsule 2    Calcium-Phosphorus-Vitamin D (CITRACAL +D3 PO) Take 2 tablets by mouth daily.     celecoxib (CELEBREX) 200 MG capsule Take 200 mg by mouth 2 (two) times daily.     Cholecalciferol (VITAMIN D3) 250 MCG (10000 UT) capsule Take 10,000 Units by mouth daily.     diazepam (VALIUM) 5 MG tablet Take 2.5 mg by mouth at bedtime.     Glucosamine-Chondroitin 250-200 MG CAPS Take 2 tablets by mouth daily.     Leuprolide  Acetate, 3 Month, (LUPRON  DEPOT, 2-MONTH, IM) Inject into the muscle every 3 (three) months.     Multiple Vitamin (MULTIVITAMIN) tablet Take 1 tablet by mouth daily.     OMEGA-3 FATTY ACIDS PO Take 500 mg by mouth daily.  250 mg per cap     solifenacin (VESICARE) 5 MG tablet Take 5 mg by mouth every evening.     Turmeric 500 MG CAPS Take 500 mg by mouth daily.     valACYclovir  (VALTREX ) 500 MG tablet Take 500 mg by mouth 2 (two) times daily as needed (breakouts).     zinc gluconate 50 MG tablet Take 50 mg by mouth daily.     No current facility-administered medications for this visit.    Physical Exam BP 128/82 (BP Location: Left Arm)   Pulse 66   Resp 18  Ht 5' 6 (1.676 m)   Wt 176 lb (79.8 kg)   SpO2 97%   BMI 28.41 kg/m  Well-appearing 68 year old man in no acute distress Alert and oriented x 3 with no focal deficits Lungs clear with equal breath sounds bilaterally Cardiac regular rate and rhythm with a 3/6 crescendo decrescendo systolic murmur No peripheral edema  Diagnostic Tests: Echocardiogram 05/06/2024 IMPRESSIONS     1. Left ventricular ejection fraction, by estimation, is 60 to 65%. The  left ventricle has normal function. The left ventricle has no regional  wall motion abnormalities. Left ventricular diastolic parameters are  consistent with Grade I diastolic  dysfunction (impaired relaxation). The average left ventricular global  longitudinal strain is -20.9 %. The global longitudinal strain is normal.   2. Right ventricular systolic function is normal. The  right ventricular  size is normal.   3. The mitral valve is normal in structure. No evidence of mitral valve  regurgitation. No evidence of mitral stenosis.   4. DI 0.49, SVi 50. The aortic valve is calcified. There is moderate  calcification of the aortic valve. There is moderate thickening of the  aortic valve. Aortic valve regurgitation is not visualized. Mild to  moderate aortic valve stenosis. Aortic valve  area, by VTI measures 1.86 cm. Aortic valve mean gradient measures 16.5  mmHg.   5. Aneurysm of the ascending aorta, measuring 43 mm.   6. The inferior vena cava is normal in size with greater than 50%  respiratory variability, suggesting right atrial pressure of 3 mmHg.   Comparison(s): Echocardiogram done 10/09/23 showed an EF of 55% with  moderate AS and an AV Mean Grad of 20 mmHg.   Impression: [name] is a 68 year old man with a history of stage IV prostate cancer, bone metastases, ulcerative colitis, sleep apnea, hyperlipidemia, degenerative joint disease, empyema, cuspid aortic valve, mild to moderate aortic stenosis, ascending aneurysm, and empyema.  Ascending aneurysm-stable at about 4.3 cm.  Blood pressure is well-controlled.  Bicuspid aortic valve-echocardiogram shows mild to moderate aortic stenosis with a valve area of 1.86 cm.  In line with his 2024 echo rather than the 1 earlier this year.  No indication for intervention.  He asked about exercise.  Unlimited on aerobic type exercise.  I advised him to limit himself to light weights if he wants to use weights and avoid any kind of heavy lifting.  Plan: Return in 6 months with CT angio of chest to follow-up aneurysm and echocardiogram follow-up aortic valve gradient.  Elspeth JAYSON Millers, MD [facility] [phone]
